# Patient Record
Sex: Female | Born: 1990 | Race: Black or African American | Hispanic: No | Marital: Single | Smoking: Current every day smoker
Health system: Southern US, Community
[De-identification: ages and names within clinical notes are randomized; demographics above are authoritative.]

## PROBLEM LIST (undated history)

## (undated) ENCOUNTER — Inpatient Hospital Stay (HOSPITAL_COMMUNITY): Payer: Self-pay

## (undated) VITALS — BP 102/67 | HR 72 | Temp 98.4°F | Resp 16

## (undated) DIAGNOSIS — IMO0002 Reserved for concepts with insufficient information to code with codable children: Secondary | ICD-10-CM

## (undated) DIAGNOSIS — S01512A Laceration without foreign body of oral cavity, initial encounter: Secondary | ICD-10-CM

## (undated) DIAGNOSIS — F32A Depression, unspecified: Secondary | ICD-10-CM

## (undated) DIAGNOSIS — B009 Herpesviral infection, unspecified: Secondary | ICD-10-CM

## (undated) DIAGNOSIS — F419 Anxiety disorder, unspecified: Secondary | ICD-10-CM

## (undated) DIAGNOSIS — Z789 Other specified health status: Secondary | ICD-10-CM

## (undated) DIAGNOSIS — Z8619 Personal history of other infectious and parasitic diseases: Secondary | ICD-10-CM

## (undated) DIAGNOSIS — F319 Bipolar disorder, unspecified: Secondary | ICD-10-CM

## (undated) DIAGNOSIS — F99 Mental disorder, not otherwise specified: Secondary | ICD-10-CM

## (undated) DIAGNOSIS — F4310 Post-traumatic stress disorder, unspecified: Secondary | ICD-10-CM

## (undated) DIAGNOSIS — D649 Anemia, unspecified: Secondary | ICD-10-CM

## (undated) DIAGNOSIS — S99911A Unspecified injury of right ankle, initial encounter: Secondary | ICD-10-CM

## (undated) DIAGNOSIS — F329 Major depressive disorder, single episode, unspecified: Secondary | ICD-10-CM

## (undated) HISTORY — PX: UTERINE ARTERY EMBOLIZATION: SHX2629

## (undated) HISTORY — DX: Herpesviral infection, unspecified: B00.9

## (undated) HISTORY — DX: Personal history of other infectious and parasitic diseases: Z86.19

## (undated) HISTORY — DX: Post-traumatic stress disorder, unspecified: F43.10

## (undated) HISTORY — DX: Unspecified injury of right ankle, initial encounter: S99.911A

## (undated) HISTORY — DX: Depression, unspecified: F32.A

## (undated) HISTORY — DX: Major depressive disorder, single episode, unspecified: F32.9

## (undated) HISTORY — DX: Bipolar disorder, unspecified: F31.9

## (undated) HISTORY — DX: Reserved for concepts with insufficient information to code with codable children: IMO0002

## (undated) HISTORY — DX: Laceration without foreign body of oral cavity, initial encounter: S01.512A

## (undated) HISTORY — DX: Mental disorder, not otherwise specified: F99

## (undated) HISTORY — DX: Anemia, unspecified: D64.9

## (undated) HISTORY — PX: WISDOM TOOTH EXTRACTION: SHX21

---

## 2009-10-05 ENCOUNTER — Emergency Department (HOSPITAL_COMMUNITY): Admission: EM | Admit: 2009-10-05 | Discharge: 2009-10-05 | Payer: Self-pay | Admitting: Emergency Medicine

## 2010-10-18 NOTE — L&D Delivery Note (Signed)
Delivery Note At 1:52 PM a viable female was delivered via Vaginal, Spontaneous Delivery (Presentation: Left Occiput Anterior).  APGAR: , ; weight .   Placenta status: Intact, Spontaneous.  Cord:  with the following complications: .  Anesthesia: Epidural  Episiotomy: None Lacerations: Vaginal; l and rLabial Suture Repair: 3.0 vicryl Est. Blood Loss (mL): 350  Mom to postpartum.  Baby to nursery-stable.  [name] 08/14/2011, 2:12 PM

## 2010-12-30 ENCOUNTER — Inpatient Hospital Stay (HOSPITAL_COMMUNITY): Payer: Medicaid Other

## 2010-12-30 ENCOUNTER — Inpatient Hospital Stay (HOSPITAL_COMMUNITY)
Admission: AD | Admit: 2010-12-30 | Discharge: 2010-12-30 | Disposition: A | Discharge status: Home / Self Care | Payer: Medicaid Other | Source: Ambulatory Visit | Attending: Family Medicine | Admitting: Family Medicine

## 2010-12-30 DIAGNOSIS — O99891 Other specified diseases and conditions complicating pregnancy: Secondary | ICD-10-CM | POA: Insufficient documentation

## 2010-12-30 DIAGNOSIS — O210 Mild hyperemesis gravidarum: Secondary | ICD-10-CM

## 2010-12-30 DIAGNOSIS — O9989 Other specified diseases and conditions complicating pregnancy, childbirth and the puerperium: Secondary | ICD-10-CM

## 2010-12-30 DIAGNOSIS — R109 Unspecified abdominal pain: Secondary | ICD-10-CM

## 2010-12-30 LAB — URINALYSIS, ROUTINE W REFLEX MICROSCOPIC
Bilirubin Urine: NEGATIVE
Glucose, UA: NEGATIVE mg/dL
Ketones, ur: NEGATIVE mg/dL
pH: 8.5 — ABNORMAL HIGH (ref 5.0–8.0)

## 2010-12-30 LAB — ABO/RH: ABO/RH(D): B POS

## 2010-12-30 LAB — CBC
HCT: 34.7 % — ABNORMAL LOW (ref 36.0–46.0)
Hemoglobin: 11.7 g/dL — ABNORMAL LOW (ref 12.0–15.0)
MCH: 28.7 pg (ref 26.0–34.0)
MCHC: 33.7 g/dL (ref 30.0–36.0)

## 2010-12-30 LAB — HCG, QUANTITATIVE, PREGNANCY

## 2010-12-31 LAB — GC/CHLAMYDIA PROBE AMP, GENITAL

## 2011-01-10 ENCOUNTER — Emergency Department (HOSPITAL_COMMUNITY)
Admission: EM | Admit: 2011-01-10 | Discharge: 2011-01-11 | Disposition: A | Discharge status: Home / Self Care | Payer: Medicaid Other | Attending: Emergency Medicine | Admitting: Emergency Medicine

## 2011-01-10 ENCOUNTER — Emergency Department (HOSPITAL_COMMUNITY): Payer: Medicaid Other

## 2011-01-10 DIAGNOSIS — O210 Mild hyperemesis gravidarum: Secondary | ICD-10-CM | POA: Insufficient documentation

## 2011-01-10 DIAGNOSIS — B3731 Acute candidiasis of vulva and vagina: Secondary | ICD-10-CM | POA: Insufficient documentation

## 2011-01-10 DIAGNOSIS — N949 Unspecified condition associated with female genital organs and menstrual cycle: Secondary | ICD-10-CM | POA: Insufficient documentation

## 2011-01-10 DIAGNOSIS — F341 Dysthymic disorder: Secondary | ICD-10-CM | POA: Insufficient documentation

## 2011-01-10 DIAGNOSIS — O99891 Other specified diseases and conditions complicating pregnancy: Secondary | ICD-10-CM | POA: Insufficient documentation

## 2011-01-10 DIAGNOSIS — F319 Bipolar disorder, unspecified: Secondary | ICD-10-CM | POA: Insufficient documentation

## 2011-01-10 DIAGNOSIS — O2390 Unspecified genitourinary tract infection in pregnancy, unspecified trimester: Secondary | ICD-10-CM | POA: Insufficient documentation

## 2011-01-10 DIAGNOSIS — B373 Candidiasis of vulva and vagina: Secondary | ICD-10-CM | POA: Insufficient documentation

## 2011-01-10 DIAGNOSIS — R45851 Suicidal ideations: Secondary | ICD-10-CM | POA: Insufficient documentation

## 2011-01-10 LAB — COMPREHENSIVE METABOLIC PANEL
ALT: 14 U/L (ref 0–35)
Alkaline Phosphatase: 45 U/L (ref 39–117)
CO2: 27 mEq/L (ref 19–32)
Calcium: 9.6 mg/dL (ref 8.4–10.5)
Chloride: 103 mEq/L (ref 96–112)
GFR calc non Af Amer: >60 mL/min (ref >60)
Glucose, Bld: 71 mg/dL (ref 70–99)
Sodium: 136 mEq/L (ref 135–145)
Total Bilirubin: 0.1 mg/dL — ABNORMAL LOW (ref 0.3–1.2)

## 2011-01-10 LAB — URINE MICROSCOPIC-ADD ON

## 2011-01-10 LAB — CBC
MCH: 29.1 pg (ref 26.0–34.0)
Platelets: 219 10*3/uL (ref 150–400)
RBC: 4.12 MIL/uL (ref 3.87–5.11)
RDW: 12.6 % (ref 11.5–15.5)
WBC: 10.6 10*3/uL — ABNORMAL HIGH (ref 4.0–10.5)

## 2011-01-10 LAB — URINALYSIS, ROUTINE W REFLEX MICROSCOPIC
Glucose, UA: NEGATIVE mg/dL
Hgb urine dipstick: NEGATIVE
Leukocytes, UA: NEGATIVE
Specific Gravity, Urine: 1.025 (ref 1.005–1.030)
pH: 6 (ref 5.0–8.0)

## 2011-01-10 LAB — DIFFERENTIAL
Basophils Relative: 0 % (ref 0–1)
Eosinophils Absolute: 0.1 10*3/uL (ref 0.0–0.7)
Monocytes Relative: 9 % (ref 3–12)
Neutrophils Relative %: 70 % (ref 43–77)

## 2011-01-10 LAB — POCT PREGNANCY, URINE

## 2011-01-10 LAB — RAPID URINE DRUG SCREEN, HOSP PERFORMED: Benzodiazepines: NOT DETECTED

## 2011-01-10 LAB — WET PREP, GENITAL: Trich, Wet Prep: NONE SEEN

## 2011-01-13 ENCOUNTER — Other Ambulatory Visit: Payer: Self-pay | Admitting: Obstetrics and Gynecology

## 2011-01-13 ENCOUNTER — Encounter: Payer: Self-pay | Admitting: Obstetrics and Gynecology

## 2011-01-13 DIAGNOSIS — O358XX0 Maternal care for other (suspected) fetal abnormality and damage, not applicable or unspecified: Secondary | ICD-10-CM

## 2011-01-13 DIAGNOSIS — Z331 Pregnant state, incidental: Secondary | ICD-10-CM

## 2011-01-13 DIAGNOSIS — O99340 Other mental disorders complicating pregnancy, unspecified trimester: Secondary | ICD-10-CM

## 2011-01-13 LAB — CONVERTED CEMR LAB
Basophils Absolute: 0 10*3/uL (ref 0.0–0.1)
HCT: 35.1 % — ABNORMAL LOW (ref 36.0–46.0)
HIV: NONREACTIVE
Hemoglobin: 12.1 g/dL (ref 12.0–15.0)
Hepatitis B Surface Ag: NEGATIVE
Lymphocytes Relative: 22 % (ref 12–46)
Lymphs Abs: 1.6 10*3/uL (ref 0.7–4.0)
Monocytes Absolute: 0.7 10*3/uL (ref 0.1–1.0)
Neutro Abs: 4.8 10*3/uL (ref 1.7–7.7)
RDW: 13.1 % (ref 11.5–15.5)
Rh Type: POSITIVE
Rubella: 49.3 intl units/mL — ABNORMAL HIGH
WBC: 7.2 10*3/uL (ref 4.0–10.5)

## 2011-01-13 LAB — POCT URINALYSIS DIP (DEVICE)
Hgb urine dipstick: NEGATIVE
Nitrite: NEGATIVE
Specific Gravity, Urine: 1.02 (ref 1.005–1.030)
Urobilinogen, UA: 0.2 mg/dL (ref 0.0–1.0)
pH: 8 (ref 5.0–8.0)

## 2011-01-18 LAB — POCT I-STAT, CHEM 8
BUN: 10 mg/dL (ref 6–23)
Calcium, Ion: 1.13 mmol/L (ref 1.12–1.32)
Chloride: 102 mEq/L (ref 96–112)
HCT: 42 % (ref 36.0–46.0)
Potassium: 3.8 mEq/L (ref 3.5–5.1)
Sodium: 136 mEq/L (ref 135–145)

## 2011-01-18 LAB — URINALYSIS, ROUTINE W REFLEX MICROSCOPIC
Bilirubin Urine: NEGATIVE
Glucose, UA: NEGATIVE mg/dL
Hgb urine dipstick: NEGATIVE
Protein, ur: NEGATIVE mg/dL
Specific Gravity, Urine: 1.026 (ref 1.005–1.030)
Urobilinogen, UA: 1 mg/dL (ref 0.0–1.0)

## 2011-01-18 LAB — RAPID STREP SCREEN (MED CTR MEBANE ONLY): Streptococcus, Group A Screen (Direct): NEGATIVE

## 2011-01-18 LAB — DIFFERENTIAL
Basophils Absolute: 0 10*3/uL (ref 0.0–0.1)
Eosinophils Relative: 0 % (ref 0–5)
Lymphocytes Relative: 7 % — ABNORMAL LOW (ref 12–46)
Lymphs Abs: 0.8 10*3/uL (ref 0.7–4.0)
Neutro Abs: 9.6 10*3/uL — ABNORMAL HIGH (ref 1.7–7.7)
Neutrophils Relative %: 86 % — ABNORMAL HIGH (ref 43–77)

## 2011-01-18 LAB — URINE MICROSCOPIC-ADD ON

## 2011-01-18 LAB — CBC
HCT: 39.3 % (ref 36.0–46.0)
Platelets: 193 10*3/uL (ref 150–400)
RDW: 13.1 % (ref 11.5–15.5)
WBC: 11.2 10*3/uL — ABNORMAL HIGH (ref 4.0–10.5)

## 2011-01-18 LAB — PREGNANCY, URINE: Preg Test, Ur: NEGATIVE

## 2011-02-17 ENCOUNTER — Other Ambulatory Visit: Payer: Self-pay | Admitting: Obstetrics and Gynecology

## 2011-02-17 DIAGNOSIS — Z331 Pregnant state, incidental: Secondary | ICD-10-CM

## 2011-02-17 DIAGNOSIS — O99340 Other mental disorders complicating pregnancy, unspecified trimester: Secondary | ICD-10-CM

## 2011-02-17 DIAGNOSIS — O358XX0 Maternal care for other (suspected) fetal abnormality and damage, not applicable or unspecified: Secondary | ICD-10-CM

## 2011-02-17 LAB — POCT URINALYSIS DIP (DEVICE)
Hgb urine dipstick: NEGATIVE
Nitrite: NEGATIVE
Protein, ur: NEGATIVE mg/dL
Specific Gravity, Urine: 1.025 (ref 1.005–1.030)
Urobilinogen, UA: 0.2 mg/dL (ref 0.0–1.0)

## 2011-02-21 ENCOUNTER — Inpatient Hospital Stay (HOSPITAL_COMMUNITY)
Admission: AD | Admit: 2011-02-21 | Discharge: 2011-02-21 | Disposition: A | Discharge status: Home / Self Care | Payer: Medicaid Other | Source: Ambulatory Visit | Attending: Obstetrics & Gynecology | Admitting: Obstetrics & Gynecology

## 2011-02-21 DIAGNOSIS — R109 Unspecified abdominal pain: Secondary | ICD-10-CM

## 2011-02-21 DIAGNOSIS — K5900 Constipation, unspecified: Secondary | ICD-10-CM | POA: Insufficient documentation

## 2011-02-21 DIAGNOSIS — O9989 Other specified diseases and conditions complicating pregnancy, childbirth and the puerperium: Secondary | ICD-10-CM

## 2011-02-21 DIAGNOSIS — O99891 Other specified diseases and conditions complicating pregnancy: Secondary | ICD-10-CM | POA: Insufficient documentation

## 2011-02-21 LAB — URINALYSIS, ROUTINE W REFLEX MICROSCOPIC
Bilirubin Urine: NEGATIVE
Glucose, UA: NEGATIVE mg/dL
Hgb urine dipstick: NEGATIVE
Ketones, ur: NEGATIVE mg/dL
Protein, ur: NEGATIVE mg/dL

## 2011-03-03 ENCOUNTER — Inpatient Hospital Stay (INDEPENDENT_AMBULATORY_CARE_PROVIDER_SITE_OTHER)
Admission: RE | Admit: 2011-03-03 | Discharge: 2011-03-03 | Disposition: A | Discharge status: Home / Self Care | Payer: Medicaid Other | Source: Ambulatory Visit | Attending: Family Medicine | Admitting: Family Medicine

## 2011-03-03 DIAGNOSIS — J069 Acute upper respiratory infection, unspecified: Secondary | ICD-10-CM

## 2011-03-03 LAB — POCT URINALYSIS DIP (DEVICE)
Hgb urine dipstick: NEGATIVE
Protein, ur: NEGATIVE mg/dL
Specific Gravity, Urine: 1.02 (ref 1.005–1.030)
Urobilinogen, UA: 0.2 mg/dL (ref 0.0–1.0)
pH: 7.5 (ref 5.0–8.0)

## 2011-03-04 ENCOUNTER — Other Ambulatory Visit: Payer: Self-pay | Admitting: Family Medicine

## 2011-03-04 ENCOUNTER — Ambulatory Visit (INDEPENDENT_AMBULATORY_CARE_PROVIDER_SITE_OTHER): Payer: Medicaid Other | Admitting: Family Medicine

## 2011-03-04 DIAGNOSIS — Z331 Pregnant state, incidental: Secondary | ICD-10-CM

## 2011-03-05 ENCOUNTER — Encounter: Payer: Self-pay | Admitting: Family Medicine

## 2011-03-05 NOTE — Progress Notes (Signed)
Here for Group Prenatal Visit: S: Feels well. Is very excited about the group visits. Had some initial prenatal care at [facility] thus has not yet had an initial prenatal visit here. No issues today. Notes that she is still smoking a few cigarettes a day.  Also notes that she has bipolar disorder. Is getting intense group therapy but is not taking any medications at this time. Also notes that she recently quit  Heavy MJ use.  O: Vs noted. Well appearing. See flowsheet.  A/P: 20 yo at 61 w 5 days. 1) Pregnancy: Will need anatomy US scheduled in 2 weeks or so. Will also need to review labs and hollister from [facility]. If info is incomplete will ask to schedule for a 1 on 1 initial prenatal visit. Will follow. 2) MJ use: Currently stopped. Good for her. Will follow and encourage continued use of group therapy. Will check UDS upon admit to the hospital for labor. 3) Bipolar Disorder: Seems under good control while in the group. Is getting very good therapy. However not on any medications as they are potentially teratogenic. Will follow. If getting manic will discuss with psych.  4) Tobacco: Uses a few ciggs a day. Group encouraged Tiffany Wong to quit and gave quite a few suggestions on how to do this. Will follow.  5) Group: Worked well. Will follow.

## 2011-03-05 NOTE — Progress Notes (Signed)
I saw [name] with Dr. Denyse Amass and reviewed and agree with his note.  [name] is a 20 yo who has joined our prenatal care group.  ROI was sent today.  We will review her records for Family and past medical hx, as well as prior OB hx.  Most labs available, but I do not yet see the sickle cell screen and we will need to determine if genetic testing was offered and desired.  [name] has several issues including BPAD, smoking and MJ use that could complicate the pregnancy.  Will review records.  Plan to continue care in the prenatal group program.  Schedule ultrasound for anatomy scan.

## 2011-03-07 ENCOUNTER — Inpatient Hospital Stay (HOSPITAL_COMMUNITY)
Admission: AD | Admit: 2011-03-07 | Discharge: 2011-03-07 | Disposition: A | Discharge status: Home / Self Care | Payer: Medicaid Other | Source: Ambulatory Visit | Attending: Obstetrics & Gynecology | Admitting: Obstetrics & Gynecology

## 2011-03-07 DIAGNOSIS — J069 Acute upper respiratory infection, unspecified: Secondary | ICD-10-CM | POA: Insufficient documentation

## 2011-03-07 LAB — URINALYSIS, ROUTINE W REFLEX MICROSCOPIC
Bilirubin Urine: NEGATIVE
Glucose, UA: 100 mg/dL — AB
Hgb urine dipstick: NEGATIVE
Ketones, ur: 15 mg/dL — AB
Protein, ur: NEGATIVE mg/dL

## 2011-03-17 ENCOUNTER — Other Ambulatory Visit: Payer: Self-pay | Admitting: Physician Assistant

## 2011-03-17 DIAGNOSIS — Z3689 Encounter for other specified antenatal screening: Secondary | ICD-10-CM

## 2011-03-17 DIAGNOSIS — O99340 Other mental disorders complicating pregnancy, unspecified trimester: Secondary | ICD-10-CM

## 2011-03-17 DIAGNOSIS — O358XX0 Maternal care for other (suspected) fetal abnormality and damage, not applicable or unspecified: Secondary | ICD-10-CM

## 2011-03-17 DIAGNOSIS — Z331 Pregnant state, incidental: Secondary | ICD-10-CM

## 2011-03-17 LAB — POCT URINALYSIS DIP (DEVICE)
Glucose, UA: NEGATIVE mg/dL
Hgb urine dipstick: NEGATIVE
Specific Gravity, Urine: 1.015 (ref 1.005–1.030)
Urobilinogen, UA: 0.2 mg/dL (ref 0.0–1.0)
pH: 8.5 — ABNORMAL HIGH (ref 5.0–8.0)

## 2011-03-23 ENCOUNTER — Ambulatory Visit (HOSPITAL_COMMUNITY)
Admission: RE | Admit: 2011-03-23 | Discharge: 2011-03-23 | Disposition: A | Discharge status: Home / Self Care | Payer: Medicaid Other | Source: Ambulatory Visit | Attending: Physician Assistant | Admitting: Physician Assistant

## 2011-03-23 DIAGNOSIS — Z363 Encounter for antenatal screening for malformations: Secondary | ICD-10-CM | POA: Insufficient documentation

## 2011-03-23 DIAGNOSIS — Z3689 Encounter for other specified antenatal screening: Secondary | ICD-10-CM

## 2011-03-23 DIAGNOSIS — O358XX0 Maternal care for other (suspected) fetal abnormality and damage, not applicable or unspecified: Secondary | ICD-10-CM | POA: Insufficient documentation

## 2011-03-23 DIAGNOSIS — Z1389 Encounter for screening for other disorder: Secondary | ICD-10-CM | POA: Insufficient documentation

## 2011-04-01 ENCOUNTER — Ambulatory Visit (INDEPENDENT_AMBULATORY_CARE_PROVIDER_SITE_OTHER): Payer: Medicaid Other | Admitting: Family Medicine

## 2011-04-01 ENCOUNTER — Encounter: Payer: Self-pay | Admitting: Family Medicine

## 2011-04-01 VITALS — BP 100/59 | Wt 118.0 lb

## 2011-04-01 DIAGNOSIS — Z3490 Encounter for supervision of normal pregnancy, unspecified, unspecified trimester: Secondary | ICD-10-CM

## 2011-04-01 DIAGNOSIS — Z3480 Encounter for supervision of other normal pregnancy, unspecified trimester: Secondary | ICD-10-CM

## 2011-04-01 NOTE — Progress Notes (Signed)
Addendum to A/P 1. Repeat Urine Culture obtained to confirm GBS negative status and absence of asymptomatic bacteruria.

## 2011-04-01 NOTE — Progress Notes (Signed)
Here for group prenatal visit.  S: She is feeling well. She is taking her PNV. She complains of round ligament pain and consitpation. She is smoking 6 cigs/day. She is interested in meeting with Dr. Raymondo Band for smoking cessation counseling. She reports thin, white vaginal D/C. She denies vaginal itching or irritation.  O: VS, reviewed. BP low normal.  GEN: well appearing. Well hydrated. NAD.  ABD: Gravid. FH 21 cm.   EXT: No edema   A/P: 20 yo primigravida at [redacted]w[redacted]d.   1. Pregnancy- progressing well. We discussed physical activity in pregnancy and an introduction to preterm labor today. Exercise and adequate water intake to prevent and treat constipation. The container today addressed abdominal and hand pain, breast pain and headache.  P: Continue GIFT visits. 1 hr GTT at 26-28 weeks.  2. Smoking- continuing to work on cessation. Referral to Dr. Raymondo Band for smoking cessation. Will follow.  3. Bipolar- Mood stable. Focused and interactive during group. Continue current management.  4. F/u- next group meeting in 4 weeks.

## 2011-04-03 LAB — CULTURE, OB URINE: Organism ID, Bacteria: NO GROWTH

## 2011-05-04 ENCOUNTER — Telehealth: Payer: Self-pay | Admitting: Family Medicine

## 2011-05-04 NOTE — Telephone Encounter (Signed)
Spoke with pt.  She is having some trouble sleeping, but otherwise doing ok.  She plans to attend the group visit on 7/26 at [facility].  She also has another appt scheduled for eval of a possible yeast infection.

## 2011-05-13 ENCOUNTER — Ambulatory Visit (INDEPENDENT_AMBULATORY_CARE_PROVIDER_SITE_OTHER): Payer: Medicaid Other | Admitting: Family Medicine

## 2011-05-13 VITALS — BP 97/55 | Wt 120.0 lb

## 2011-05-13 DIAGNOSIS — F1911 Other psychoactive substance abuse, in remission: Secondary | ICD-10-CM

## 2011-05-13 DIAGNOSIS — IMO0002 Reserved for concepts with insufficient information to code with codable children: Secondary | ICD-10-CM | POA: Insufficient documentation

## 2011-05-13 DIAGNOSIS — Z3400 Encounter for supervision of normal first pregnancy, unspecified trimester: Secondary | ICD-10-CM

## 2011-05-13 DIAGNOSIS — F319 Bipolar disorder, unspecified: Secondary | ICD-10-CM

## 2011-05-13 LAB — GLUCOSE, CAPILLARY: Glucose-Capillary: 115 mg/dL — ABNORMAL HIGH (ref 70–99)

## 2011-05-13 LAB — CBC
MCV: 88.1 fL (ref 78.0–100.0)
Platelets: 181 10*3/uL (ref 150–400)
RBC: 3.7 MIL/uL — ABNORMAL LOW (ref 3.87–5.11)
WBC: 9.9 10*3/uL (ref 4.0–10.5)

## 2011-05-13 MED ORDER — NATALCARE PIC 60-1 MG PO TABS: 1.0000 | ORAL_TABLET | Freq: Every day | ORAL | Status: DC

## 2011-05-13 NOTE — Patient Instructions (Addendum)
Our next group will meet as scheduled on Thursday, August 9th at 2:30. We will meet in classroom 4 at [facility].  This is by the clinics and the education entrance.    If you have bleeding, if your water breaks, if you have regular contractions that last for 2 hours, or if the baby is not moving well, please go to [facility] to be evaluated.  You can buy over the counter miconazole or clotrimazole (we will give you a prescription for the miconazole in case it is cheaper with a prescription).  If that does not clear up the infection, you can try fluconazole 150 mg one pill one time.  We will also give you a prescription for that.

## 2011-05-13 NOTE — Progress Notes (Signed)
S: Doing well, having some vaginal discharge. Pt is concerned about yeast infection.  Biggest concerns are constipation and hyperpigmentation on her abdomen.  Continues trying to cut down on tobacco use.  O: See flow sheet  A/P: G3P0020 @ 27.4wks 1) Pregnancy: Overall doing well; 28 wk labs drawn today 2) Vaginal d/c: Will encourage use of OTC products for presumed yeast infection; if not improved with these, will do wet prep and treat with diflucan if indicated 3) 1hr GTT: normal today at 115, pt very excited 4) F/u in 2 weeks at [facility] for next group visit; next labs needed will be cultures at 36-38 wks

## 2011-05-14 ENCOUNTER — Ambulatory Visit (INDEPENDENT_AMBULATORY_CARE_PROVIDER_SITE_OTHER): Payer: Medicaid Other | Admitting: Family Medicine

## 2011-05-14 VITALS — BP 115/73 | Wt 118.0 lb

## 2011-05-14 DIAGNOSIS — N760 Acute vaginitis: Secondary | ICD-10-CM

## 2011-05-14 DIAGNOSIS — Z3400 Encounter for supervision of normal first pregnancy, unspecified trimester: Secondary | ICD-10-CM

## 2011-05-14 DIAGNOSIS — IMO0002 Reserved for concepts with insufficient information to code with codable children: Secondary | ICD-10-CM

## 2011-05-14 DIAGNOSIS — A6000 Herpesviral infection of urogenital system, unspecified: Secondary | ICD-10-CM

## 2011-05-14 DIAGNOSIS — A6009 Herpesviral infection of other urogenital tract: Secondary | ICD-10-CM | POA: Insufficient documentation

## 2011-05-14 LAB — POCT WET PREP (WET MOUNT): Trichomonas Wet Prep HPF POC: NEGATIVE

## 2011-05-14 MED ORDER — VALACYCLOVIR HCL 500 MG PO TABS: ORAL_TABLET | ORAL | Status: DC

## 2011-05-14 NOTE — Patient Instructions (Signed)
Welma and Quinton,  Thank you for coming in today. Congrats on the baby!  Here is some info/precautions that I always like to review in the third trimester:  Please watch out for signs of preterm labor: vaginal bleeding, leakage or gush of fluids, 4 or more contractions for 2 or more hours. Please call the clinic if any of these occur and go to [facility].  Please watch out for decreased fetal movement: If you feel that the baby is not moving as well as usual. You should count kicks. Go to a quite room and count each kick. Anything less than 10 kicks an hour for 2 or more hours is decreased/low. If this happens try drinking cool water. If that does not help. [facility] the [facility]   See you at the next GIFT.  -Dr. Armen Pickup

## 2011-05-14 NOTE — Progress Notes (Signed)
S: 20 yo primigravida here to f/u ? Yeast infection and ? Herpes genitalis outbreak. 1. ? Yeast: thick white vaginal discharge x 2 weeks. No vaginal bleeding or LOF. Vaginal itching and burning. Has not yet filled script for Diflucan.  2. ? Herpes outbreak: hx of herpes x 2 years. Last outbreak more than one year ago. Felt she had an outbreak associated with yeast infection. Describes itching and burning. No noticeable lesions. Sexually active with FOB. Has not been sexually active since sensation of outbreak. FOB is not aware of her history of genital herpes. She states that she is afraid to tell him and afraid of his response. He is here today. She does not want to tell him now, but is willing to bring him in to tell him in the near future.   ROS: See OB flowsheet. PE: GEN: well developed, NAD, well hydrated.  Abd: Gravid, NT, FH 28 cm, FHR 151 Genital: no vesicles or ulcers on perineum. ? Swelling. Thick white discharge in vagina. No bleeding or fluid from cervical os. Cervix long/closed/high. No cervical motion tenderness.  EXT: no edema  A/P: 20 yo primigravida at 27w5 day. Precepted with Dr. Mauricio Po.  1. Vaginitis: 2/2 to yeast. Treat with Difucan. 2. ? Herpes genitalis outbreak: no active lesions. Will treat with valacyclovir. Start prophylactic valacyclovir at 35 weeks. Instructed pt to avoid sexual intercourse until completion of treatment. Advised pt to tell partner as soon as possible.  3. reviewed prenatal labs. 1 hr glucola screen passed.  4. F/u: 2 weeks at next GIFT.

## 2011-05-16 NOTE — Assessment & Plan Note (Signed)
See note

## 2011-05-17 DIAGNOSIS — F319 Bipolar disorder, unspecified: Secondary | ICD-10-CM | POA: Insufficient documentation

## 2011-05-17 DIAGNOSIS — F1911 Other psychoactive substance abuse, in remission: Secondary | ICD-10-CM | POA: Insufficient documentation

## 2011-05-17 NOTE — Progress Notes (Addendum)
Chart reviewed.  Pt seen on 7/26 by me as well as Dr. Fara Boros in GIFT prenatal group visit.  Doing well except for back pain and vaginal itching as discusses below.  Reviewed baby care, preterm labor and kick counts.

## 2011-05-26 ENCOUNTER — Telehealth: Payer: Self-pay | Admitting: Family Medicine

## 2011-05-26 NOTE — Telephone Encounter (Signed)
Left VM for pt that we will meet at [facility] in Classroom 4 at 2:30.

## 2011-06-10 ENCOUNTER — Ambulatory Visit (INDEPENDENT_AMBULATORY_CARE_PROVIDER_SITE_OTHER): Payer: Medicaid Other | Admitting: Family Medicine

## 2011-06-10 DIAGNOSIS — Z3400 Encounter for supervision of normal first pregnancy, unspecified trimester: Secondary | ICD-10-CM

## 2011-06-10 DIAGNOSIS — IMO0002 Reserved for concepts with insufficient information to code with codable children: Secondary | ICD-10-CM

## 2011-06-10 NOTE — Patient Instructions (Addendum)
Please make an appointment with Dr. Raymondo Band to discuss other options for helping you quit smoking! You've already done a great job with cutting down, so we would love to help you quit all together!  It was great to see you! I hope that the discussion on breast feeding was helpful! I am giving you some information on kick counts. You can go to [email or web address] to look up prenatal yoga classes to help with your back pain. We will see you in 2 weeks for the next session!   Kick Count Fetal Movement Counts Kick counts is highly recommended in high risk pregnancies, but it is a good idea for every pregnant woman to do. Start counting fetal movements at 28 weeks of the pregnancy. Fetal movements increase after eating a full meal or eating or drinking something sweet (the blood sugar is higher). It is also important to drink plenty of fluids (well hydrated) before doing the count. Lie on your left side because it helps with the circulation or you can sit in a comfortable chair with your arms over your belly (abdomen) with no distractions around you. DOING THE COUNT:  Try to do the count the same time of day each time you do it.   Mark the day and time, then see how long it takes for you to feel 10 movements (kicks, flutters, swishes, rolls). You should have at least 10 movements within 2 hours. You will most likely feel 10 movements in much less than 2 hours. If you do not, wait an hour and count again. After a couple of days you will see a pattern.   What you are looking for is a change in the pattern or not enough counts in 2 hours. Is it taking longer in time to reach 10 movements?  SEEK MEDICAL CARE IF:  You feel less than 10 counts in 2 hours. Tried twice.   No movement in one hour.   The pattern is changing or taking longer each day to reach 10 counts in 2 hours.   You feel the baby is not moving as it usually does.

## 2011-06-10 NOTE — Progress Notes (Signed)
S: Doing well, having some vaginal discharge that has thickened but is not bothersome.  Continues trying to cut down on tobacco use. Baby moved all of the time, sometimes painful/uncomfortbale.  C/o some mild congestion.  O: See flow sheet  A/P: G1P0 @ 31.4  1) Pregnancy: Doing well, 28 wk labs WNL, encouraged continuing PNV  2) Smoking: encouraged pt to make appt with Dr. Raymondo Band to discuss smoking cessation; pt also given 1-800 quit line information 3) F/u 2 weeks for next group visit. Next labs will be GC/Chlam and GBS at 36-38 weeks.  4) Discussed kick counts.  5) Lactation guest speaker today who answered questions about breast feeding.

## 2011-06-14 NOTE — Progress Notes (Addendum)
I saw Tiffany Wong with Dr. Fara Wong and agree with her plan and note.  Tiffany Wong also needs HSV prophylaxis at 36 weeks.

## 2011-06-23 ENCOUNTER — Telehealth: Payer: Self-pay | Admitting: Family Medicine

## 2011-06-23 NOTE — Telephone Encounter (Signed)
Left msg to remind pt of appt tomorrow. 

## 2011-06-24 ENCOUNTER — Ambulatory Visit (INDEPENDENT_AMBULATORY_CARE_PROVIDER_SITE_OTHER): Payer: Medicaid Other | Admitting: Family Medicine

## 2011-06-24 DIAGNOSIS — Z3400 Encounter for supervision of normal first pregnancy, unspecified trimester: Secondary | ICD-10-CM

## 2011-06-24 NOTE — Progress Notes (Signed)
Pt presents for routine care.  She reports she is no longer taking [place] - only med now is prenatal vitamin.  + support from FOB and family.  Still smoking 4-5 cigs/day.  + B-H ctx.  Denies vaginal d/c or LOF. Constipation improved.  Discussed fetal growth and safe sleeping in group today.  Vertex confirmed by ultrasound. A/P: 1) pregnancy - doing well.  PTL precautions and kick counts reviewed.  F/u 2 weeks for group visit 2) HSV- Denies current lesions.  Has valcylovir at home - would like to start at 35 weeks, not 36.  This is fine with me. 3) BPAD- Well controlled.  Pt followed by psych - currently off meds.  Lots of support from boyfriend and family. 4) Smoking - pt trying to quit.  Interested in meeting with Dr. Raymondo Band, but we did not make appt last visit.

## 2011-06-24 NOTE — Patient Instructions (Signed)
Everything look great today. We will see you in 2 weeks at 2:30 again. If you have any bleeding, if your baby is not moving well, if you break your water, or if you have regular contractions, [facility].  Call us with any concerns.  

## 2011-07-07 ENCOUNTER — Telehealth: Payer: Self-pay | Admitting: Family Medicine

## 2011-07-07 NOTE — Telephone Encounter (Signed)
Called at home/cell. Left voicemail reminding patient of GIFT meeting and topics (breastfeeding and baby checklist).

## 2011-07-08 ENCOUNTER — Ambulatory Visit (INDEPENDENT_AMBULATORY_CARE_PROVIDER_SITE_OTHER): Payer: Medicaid Other | Admitting: Family Medicine

## 2011-07-08 VITALS — BP 114/77 | Temp 98.6°F | Wt 129.0 lb

## 2011-07-08 DIAGNOSIS — Z23 Encounter for immunization: Secondary | ICD-10-CM

## 2011-07-08 DIAGNOSIS — Z331 Pregnant state, incidental: Secondary | ICD-10-CM

## 2011-07-09 ENCOUNTER — Ambulatory Visit (INDEPENDENT_AMBULATORY_CARE_PROVIDER_SITE_OTHER): Payer: Medicaid Other | Admitting: Pharmacist

## 2011-07-09 ENCOUNTER — Encounter: Payer: Self-pay | Admitting: Pharmacist

## 2011-07-09 VITALS — BP 95/67 | HR 112 | Wt 127.0 lb

## 2011-07-09 DIAGNOSIS — IMO0002 Reserved for concepts with insufficient information to code with codable children: Secondary | ICD-10-CM

## 2011-07-09 NOTE — Assessment & Plan Note (Signed)
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  She desires to bring up her child in a smoke free environment.  The husband is also a smoker and he is contemplative about quitting at this time. .    Patient willing to continue work on tapering plan at this time. She is committed to smoking < 4 cigs per day by two weeks.  She will need to set a final quit date in the month of October.  Written information provided. Provided information on 1 800-QUIT NOW support program.   F/U Rx contact will be part of her GIFT meeting on October 4th.  Total time with patient in face-to-face counseling 35 minutes.

## 2011-07-09 NOTE — Progress Notes (Signed)
  Subjective:  Age when started using tobacco on a daily basis 13 yoa. Number of Cigarettes per day 6-8 (most per day 2 ppd). Brand smoked [place] Menthol. Estimated Nicotine Content per Cigarette (mg) 1.2.  Estimated Nicotine intake per day 8-10mg .   Smokes first cigarette 10 minutes after waking. Denies waking to smoke - smokes only at night with nerves and frustration.    Estimated Fagerstrom Score 6/10.  Most recent quit attempt never. Longest time ever been tobacco free none.. What Medications (NRT, bupropion, varenicline) used in past includes patch once.  Rates IMPORTANCE of quitting tobacco on 1-10 scale of 10. Rates READINESS of quitting tobacco on 1-10 scale of 10. Rates CONFIDENCE of quitting tobacco on 1-10 scale of 8-9. Triggers to use tobacco include; first in AM while still in BED, After meals, Boredom, TV  Patient ID: Tiffany Wong, female    DOB: 04/09/1991, 20 y.o.   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  She desires to bring up her child in a smoke free environment.  The husband is also a smoker and he is contemplative about quitting at this time. .    Patient willing to continue work on tapering plan at this time. She is committed to smoking < 4 cigs per day by two weeks.  She will need to set a final quit date in the month of October.  Written information provided. Provided information on 1 800-QUIT NOW support program.   F/U Rx contact will be part of her GIFT meeting on October 4th.  Total time with patient in face-to-face counseling 35 minutes.

## 2011-07-09 NOTE — Patient Instructions (Addendum)
Decrease smoking over the next two weeks ( By October 5th) to 3 cigarettes or less per day.  Smoking times include after breakfast, after the afternoon nap and at bedtime.   Next visit is Group Class.

## 2011-07-09 NOTE — Progress Notes (Signed)
S: Pt presents for f/u GIFT. She is accompanied by boyfriend and FOB Fritzi Mandes) and her duola .  S + support from FOB and family.  Still smoking 5-6 cigs/day.  She reports pain and contractions at night. Denies vaginal d/c or LOF.Vertex confirmed by ultrasound. O: see flow sheet  A/P: 1) pregnancy - doing well.  Breastfeeding reviewed. Collected GC/Chlam and GBS today. Administered flu and tdap vaccine.    2) HSV- Denies current lesions.  Taking prophylactic valacyclovir.  3) BPAD- Well controlled.  Pt followed by psych - currently off meds.  Lots of support from boyfriend and family. 4) Smoking - pt trying to quit.  Smoking 5-6 cigs per day. Interested in meeting with Dr. Raymondo Band. 5) Circ: desired. Gave pt phone number to [facility] to arrange outpt circ. 6) Feeding- breast 7) Contraception- patch/ring 8)  F/u- 2 weeks with GIFT, then q weekly.

## 2011-07-12 NOTE — Progress Notes (Signed)
  Subjective:    Patient ID: Tiffany Wong, female    DOB: 1991-06-26, 20 y.o.   MRN: [id number]  HPI Reviewed and agree with Dr. Macky Lower management.    Review of Systems     Objective:   Physical Exam        Assessment & Plan:

## 2011-07-16 ENCOUNTER — Ambulatory Visit (INDEPENDENT_AMBULATORY_CARE_PROVIDER_SITE_OTHER): Payer: Medicaid Other | Admitting: Family Medicine

## 2011-07-16 ENCOUNTER — Encounter: Payer: Self-pay | Admitting: Family Medicine

## 2011-07-16 VITALS — BP 93/66 | HR 99 | Wt 128.0 lb

## 2011-07-16 DIAGNOSIS — N898 Other specified noninflammatory disorders of vagina: Secondary | ICD-10-CM

## 2011-07-16 DIAGNOSIS — B373 Candidiasis of vulva and vagina: Secondary | ICD-10-CM

## 2011-07-16 LAB — POCT WET PREP (WET MOUNT)
Clue Cells Wet Prep HPF POC: NEGATIVE
WBC, Wet Prep HPF POC: >20

## 2011-07-16 MED ORDER — FLUCONAZOLE 150 MG PO TABS: 150.0000 mg | ORAL_TABLET | Freq: Once | ORAL | Status: AC

## 2011-07-19 DIAGNOSIS — B3731 Acute candidiasis of vulva and vagina: Secondary | ICD-10-CM | POA: Insufficient documentation

## 2011-07-19 DIAGNOSIS — B373 Candidiasis of vulva and vagina: Secondary | ICD-10-CM | POA: Insufficient documentation

## 2011-07-19 NOTE — Progress Notes (Addendum)
  Subjective:    Patient ID: Tiffany Wong, female    DOB: Oct 15, 1991, 20 y.o.   MRN: [id number]  HPI Vaginal discharge: Patient reports positive itching, and burning/ raw sensation in the introitus of vagina. Has been present x1-2 weeks. Tried over-the-counter yeast infection cream. This did not seem to help. Would like to be tested and given something stronger. Patient denies abdominal pain. No fever. No recent herpes outbreak. Baby is moving well. No vaginal discharge. No vaginal bleeding. Negative GC chlamydia test on 9/20.  Patient denies any sexual exposure since this negative test.  Review of Systems As per above    Objective:   Physical Exam  Constitutional: She appears well-developed and well-nourished.  Cardiovascular: Normal rate.   Pulmonary/Chest: Effort normal. No respiratory distress.  Abdominal: There is no tenderness.       Gravid  Genitourinary: Vagina normal. There is no rash, tenderness or lesion on the right labia. There is no rash, tenderness or lesion on the left labia. Cervix exhibits discharge (Moderate amount of white discharge, positive stuck on appearance to vaginal wall, consistent with yeast). Cervix exhibits no motion tenderness and no friability.          Assessment & Plan:

## 2011-07-22 ENCOUNTER — Ambulatory Visit (INDEPENDENT_AMBULATORY_CARE_PROVIDER_SITE_OTHER): Payer: Medicaid Other | Admitting: Family Medicine

## 2011-07-22 DIAGNOSIS — Z331 Pregnant state, incidental: Secondary | ICD-10-CM

## 2011-07-22 NOTE — Patient Instructions (Signed)
Thank you for coming in today. Please make an appointment with Dr. Armen Pickup, [place], or [place] in 1 week.  Normal Labor and Delivery (First Baby) Your caregiver must first be sure you are in labor. Signs of labor include:  You may pass what is called "the mucus plug" before labor begins. This is a small amount of blood stained mucus.   Regular uterine contractions.   The time between contractions get closer together.   The discomfort and pain gradually gets more intense.   Pains are mostly located in the back.   Pains get worse when walking.   The cervix (the opening of the uterus becomes thinner (begins to efface) and opens up (dilates).  Once you are in labor and admitted into the hospital or care center, your caregiver will do the following:  A complete physical examination.   Check your vital signs (blood pressure, pulse, temperature and the fetal heart rate).   Do a vaginal examination (using a sterile glove and lubricant) to determine:   The position (presentation) of the baby (head [vertex] or buttock first).   The level (station) of the baby's head in the birth canal.   The effacement and dilatation of the cervix.   You may have your pubic hair shaved and be given an enema depending on your caregiver and the circumstance.   An electronic monitor is usually placed on your abdomen. The monitor follows the length and intensity of the contractions, as well as the baby's heart rate.   Usually, your caregiver will insert an IV in your arm with a bottle of sugar water. This is done as a precaution so that medications can be given to you quickly during labor or delivery.  NORMAL LABOR AND DELIVERY IS DIVIDED UP INTO THREE STAGES: First Stage This is when regular contractions begin and the cervix begins to efface and dilate. This stage can last from 3 to 15 hours. The end of the first stage is when the cervix is 100% effaced and 10 centimeters dilated. Pain medications may be  given by    Injection (morphine, demerol, etc.)   Regional anesthesia (spinal, caudal or epidural, anesthetics given in different locations of the spine). Paracervical pain medication may be given, which is an injection of and anesthetic (novocaine or xylocaine) on each side of the cervix.  A pregnant woman may request to have "Natural Childbirth" which is not to have any medications or anesthesia during her labor and delivery. Second Stage This is when the baby comes down through the birth canal (vagina) and is born. This can take 1 to 4 hours. As the baby's head comes down through the birth canal, you may feel like you are going to have a bowel movement. You will get the urge to bear down and push until the baby is delivered. As the baby's head is being delivered, the caregiver will decide if an episiotomy (a cut in the perineum and vagina area) is needed to prevent tearing of the tissue in this area. The episiotomy is sewn up after the delivery of the baby and placenta. Sometimes a mask with nitrous oxide is given for the mother to breath during the delivery of the baby to help if there is too much pain. The end of Stage 2 is when the baby is fully delivered. Then when the umbilical cord stops pulsating it is clamped and cut. Third Stage The third stage begins after the baby is completely delivered and ends after the placenta (afterbirth)  is delivered. This usually takes 5 to 30 minutes. After the placenta is delivered, a medication is given either by intravenous or injection to help contract the uterus and prevent bleeding. The third stage is not painful and pain medication is usually not necessary. If an episiotomy was done, it is repaired at this time. After the delivery, the mother is watched and monitored closely for 1 to 2 hours to make sure there is no postpartum bleeding (hemorrhage). If there is a lot of bleeding, medication is given to contract the uterus and stop the bleeding. Document  Released: 07/13/2008 Document Re-Released: 10/26/2009 [facility] Patient Information 2011 [place], [place].

## 2011-07-22 NOTE — Progress Notes (Signed)
S: Increase in back, and hip pain. Have not been taking any pain medications. Feeling "down". No SI/HI  She reports pain and contractions at night. Denies vaginal d/c or LOF.Vertex confirmed by ultrasound. O: see flow sheet  A/P: 1) pregnancy - doing well.  Breastfeeding reviewed.  2) HSV- Denies current lesions.  Taking prophylactic valacyclovir.  3) BPAD- Well controlled.  Pt followed by psych - currently off meds.  Lots of support from boyfriend and family. 4) Circ: desired. Gave pt phone number to [facility] to arrange outpt circ. 5) Feeding- breast 6) Contraception- patch/ring 7)  F/u- 1 weeks with Dr. Armen Pickup

## 2011-07-26 ENCOUNTER — Inpatient Hospital Stay (HOSPITAL_COMMUNITY)
Admission: AD | Admit: 2011-07-26 | Discharge: 2011-07-26 | Disposition: A | Discharge status: Home / Self Care | Payer: Medicaid Other | Source: Ambulatory Visit | Attending: Obstetrics & Gynecology | Admitting: Obstetrics & Gynecology

## 2011-07-26 ENCOUNTER — Encounter (HOSPITAL_COMMUNITY): Payer: Self-pay | Admitting: *Deleted

## 2011-07-26 DIAGNOSIS — O479 False labor, unspecified: Secondary | ICD-10-CM

## 2011-07-26 HISTORY — DX: Other specified health status: Z78.9

## 2011-07-26 HISTORY — DX: Herpesviral infection, unspecified: B00.9

## 2011-07-26 MED ORDER — GI COCKTAIL ~~LOC~~
30.0000 mL | Freq: Once | ORAL | Status: AC
  Administered 2011-07-26: 30 mL via ORAL
  Filled 2011-07-26: qty 30

## 2011-07-26 NOTE — ED Provider Notes (Signed)
Chief Complaint:  Labor Eval   Tiffany Wong is  20 y.o. [id number].  Patient's last menstrual period was 11/01/2010..  [redacted]w[redacted]d She presents complaining of Labor Eval . Onset is described as intermittent and has been present for  3 days. States pain is sharp and stabbing. Constant burning/"ripping" pain in epigastric area. Reports nausea with contractions, no vomiting. Denies LOF or blding  Obstetrical/Gynecological History: OB History    Grav Para Term Preterm Abortions TAB SAB Ect Mult Living   3 0 0 0 2 0 2 0 0 0       Past Medical History: Past Medical History  Diagnosis Date  . Anemia   . Mental disorder     BPAD - suicide attempt 2008  . HSV infection   . Herpes     last outbreak at 30 weeks  . No pertinent past medical history     Past Surgical History: No past surgical history on file.  Family History: Family History  Problem Relation Age of Onset  . Thyroid disease Maternal Aunt   . Hypertension Maternal Aunt   . Lupus Maternal Aunt   . Thyroid disease Maternal Uncle   . Hypertension Maternal Uncle   . Hypertension Maternal Grandmother   . Cancer Maternal Grandmother   . Lupus Mother   . Inflammatory bowel disease Father   . Liver disease Father     Social History: History  Substance Use Topics  . Smoking status: Current Everyday Smoker -- 0.5 packs/day    Types: Cigarettes  . Smokeless tobacco: Not on file  . Alcohol Use: No    Allergies:  Allergies  Allergen Reactions  . Aspirin Other (See Comments)    Nose Bleeds    Prescriptions prior to admission  Medication Sig Dispense Refill  . Prenatal Vit-Fe Psac Cmplx-FA (PRENATAL MULTIVITAMIN) 60-1 MG tablet Take 1 tablet by mouth daily with breakfast.  100 tablet  3  . valACYclovir (VALTREX) 500 MG tablet One tab daily for the next 3 days. Take immediately if you have symptoms of an outbreak. Start taking regularly at 35 weeks.  30 tablet  1    Review of Systems - Negative except for what is has  been reviewed in the HPI  Physical Exam   Blood pressure 116/64, pulse 103, temperature 97.6 F (36.4 C), resp. rate 20, height 5\' 1"  (1.549 m), weight 60.555 kg (133 lb 8 oz), last menstrual period 11/01/2010.  General: General appearance - alert, well appearing, and in no distress and oriented to person, place, and time Mental status - alert, oriented to person, place, and time, normal mood, behavior, speech, dress, motor activity, and thought processes, affect appropriate to mood Abdomen - Gravid, nontender Focused Gynecological Exam: closed/thinning/vtx/-2 FHR:   ED Course: Will medicate with GI cocktail   Assessment: False Labor Patient Active Problem List  Diagnoses  . Supervision of normal first pregnancy  . Tobacco use complicating pregnancy or childbirth  . Genital herpes complicating pregnancy  . Bipolar affective disorder  . Substance abuse in remission  . Vaginal yeast infection    Plan: Discharge home with labor precautions FU as scheduled at the [facility]  [name] E. 07/26/2011,11:29 AM

## 2011-07-29 ENCOUNTER — Ambulatory Visit (INDEPENDENT_AMBULATORY_CARE_PROVIDER_SITE_OTHER): Payer: Medicaid Other | Admitting: Family Medicine

## 2011-07-29 DIAGNOSIS — Z3490 Encounter for supervision of normal pregnancy, unspecified, unspecified trimester: Secondary | ICD-10-CM

## 2011-07-29 DIAGNOSIS — Z331 Pregnant state, incidental: Secondary | ICD-10-CM

## 2011-07-29 MED ORDER — POLYETHYLENE GLYCOL 3350 17 GM/SCOOP PO POWD: 17.0000 g | Freq: Every day | ORAL | Status: AC

## 2011-07-29 MED ORDER — RANITIDINE HCL 150 MG PO TABS: 150.0000 mg | ORAL_TABLET | Freq: Two times a day (BID) | ORAL | Status: DC

## 2011-07-29 NOTE — Patient Instructions (Signed)
It was a pleasure to see you today!  I am sending a prescription for Zantac 150mg , take 1 tablet twice daily for the pain in the upper part of your abdomen.   Please keep your next OB appointment on the 18th of October.  If you are having contractions that come every 3-5 minutes and are hard enough to make it difficult to talk, for over 1 hour, please go to the MAU of [facility].  If you feel a loss of fluid (either a gush or a slow trickle),this may be a sign that your water is broken and you should be seen immediately at the MAU.    If you notice herpes lesions, please call our office to let us know.

## 2011-07-29 NOTE — Progress Notes (Signed)
Seen today in OB clinic; was seen in MAU on 10/08 for epigastric pain "like the baby is tearing my muscles apart".  Got better with GI cocktail, but has resumed since then.  Not able to tolerate TUMS (make me feel like I'll vomit").  No emesis now.  Good FM and no vaginal bleeding, no HSV lesions reported. Taking valacyclovir daily as prescribed, as well as PNV with Fe.  Resumed Ambien for sleep.  Does not feel depressed, continues to see therapist and has strong social supports. Is excited about son, "Tiffany Wong". Plans to breast feed. The CTX of 2 days ago have stopped.  No LOF. Plan for repeat OB visit in 1 week; at that time may need to schedule NST/BPP between 40 and 41 weeks.  Labs (cervical cx and GBS) reviewed, both negative.  Tiffany Wong

## 2011-08-05 ENCOUNTER — Telehealth: Payer: Self-pay | Admitting: Family Medicine

## 2011-08-05 ENCOUNTER — Encounter: Payer: Self-pay | Admitting: Family Medicine

## 2011-08-05 ENCOUNTER — Encounter: Payer: Medicaid Other | Admitting: Family Medicine

## 2011-08-05 ENCOUNTER — Inpatient Hospital Stay (HOSPITAL_COMMUNITY)
Admission: AD | Admit: 2011-08-05 | Discharge: 2011-08-05 | Disposition: A | Discharge status: Home / Self Care | Payer: Medicaid Other | Source: Ambulatory Visit | Attending: Family Medicine | Admitting: Family Medicine

## 2011-08-05 DIAGNOSIS — O479 False labor, unspecified: Secondary | ICD-10-CM

## 2011-08-05 MED ORDER — HYDROXYZINE HCL 25 MG PO TABS
25.0000 mg | ORAL_TABLET | Freq: Three times a day (TID) | ORAL | Status: DC | PRN
  Administered 2011-08-05: 25 mg via ORAL
  Filled 2011-08-05: qty 1

## 2011-08-05 MED ORDER — OXYCODONE-ACETAMINOPHEN 5-325 MG PO TABS
1.0000 | ORAL_TABLET | Freq: Once | ORAL | Status: AC
  Administered 2011-08-05: 1 via ORAL
  Filled 2011-08-05: qty 1

## 2011-08-05 MED ORDER — CYCLOBENZAPRINE HCL 5 MG PO TABS: 5.0000 mg | ORAL_TABLET | Freq: Three times a day (TID) | ORAL | Status: DC | PRN

## 2011-08-05 NOTE — ED Provider Notes (Signed)
History   The patient is a 20 y.o. year old G18P0020 female at [redacted]w[redacted]d weeks gestation who presents to MAU reporting back pain, irreg UCs, baby kicking her ribs and having difficulty w/ ambulation and self care due to these discomforts. She reports pos FM and denies LOF or VB. Pt tearful.   CSN: [id number] Arrival date & time: 08/05/2011 11:35 AM  OB History    Grav Para Term Preterm Abortions TAB SAB Ect Mult Living   3 0 0 0 2 0 2 0 0 0      # Outc Date GA Lbr Len/2nd Wgt Sex Del Anes PTL Lv   1 SAB 11/05           Comments: SAB - so early she did not know she was pregnancy   2 SAB 6/07 [redacted]w[redacted]d          3 CUR               Chief Complaint  Patient presents with  . Labor Eval    (Consider location/radiation/quality/duration/timing/severity/associated sxs/prior treatment) HPI  Past Medical History  Diagnosis Date  . Anemia   . Mental disorder     BPAD - suicide attempt 2008  . HSV infection   . Herpes     last outbreak at 30 weeks  . No pertinent past medical history     No past surgical history on file.  Family History  Problem Relation Age of Onset  . Thyroid disease Maternal Aunt   . Hypertension Maternal Aunt   . Lupus Maternal Aunt   . Thyroid disease Maternal Uncle   . Hypertension Maternal Uncle   . Hypertension Maternal Grandmother   . Cancer Maternal Grandmother   . Lupus Mother   . Inflammatory bowel disease Father   . Liver disease Father     History  Substance Use Topics  . Smoking status: Current Everyday Smoker -- 0.5 packs/day    Types: Cigarettes  . Smokeless tobacco: Not on file  . Alcohol Use: No    OB History    Grav Para Term Preterm Abortions TAB SAB Ect Mult Living   3 0 0 0 2 0 2 0 0 0       Review of Systems:Otherwise neg  Allergies  Aspirin  Home Medications  No current outpatient prescriptions on file.  BP 126/62  Pulse 98  Temp(Src) 98.1 F (36.7 C) (Oral)  Resp 20  Ht 5\' 1"  (1.549 m)  Wt 61.236 kg (135 lb)  BMI  25.51 kg/m2  LMP 11/01/2010  Physical Exam Dilation: Closed Effacement (%): 50 Cervical Position: Middle Station: -3 Presentation: Undeterminable Exam by:: M.Topp,RN Vertex to leopolds FHR category I Toco: irreg mild UC's ED Course  Procedures (including critical care time)  MDM  Assessment: 1. Prodromal labor/Normal discomforts of third trimester 2. FHR category I 3. 39.4 week IUP  Plan 1. D/C home 2. Call Family Practice to schedule F/U appt 3. Labor p[recautios, FKCs 4. Rx Flexeril  Dorathy Kinsman 08/05/2011 12:41 PM

## 2011-08-05 NOTE — Telephone Encounter (Signed)
Called pt at home. See she cancelled her appt for today. Asked her to make prenatal  f/u for tomorrow or early next week. Left voicemail.

## 2011-08-09 NOTE — ED Provider Notes (Signed)
Chart reviewed and agree with management and plan.  

## 2011-08-10 DIAGNOSIS — Z3490 Encounter for supervision of normal pregnancy, unspecified, unspecified trimester: Secondary | ICD-10-CM | POA: Insufficient documentation

## 2011-08-10 NOTE — Assessment & Plan Note (Signed)
Singleton pregnancy.  See Vitals and Notes for assessment and plan.

## 2011-08-11 ENCOUNTER — Encounter (HOSPITAL_COMMUNITY): Payer: Self-pay | Admitting: *Deleted

## 2011-08-11 ENCOUNTER — Inpatient Hospital Stay (EMERGENCY_DEPARTMENT_HOSPITAL)
Admission: AD | Admit: 2011-08-11 | Discharge: 2011-08-12 | Disposition: A | Discharge status: Home / Self Care | Payer: Medicaid Other | Source: Ambulatory Visit | Attending: Obstetrics and Gynecology | Admitting: Obstetrics and Gynecology

## 2011-08-11 DIAGNOSIS — O4700 False labor before 37 completed weeks of gestation, unspecified trimester: Secondary | ICD-10-CM

## 2011-08-11 DIAGNOSIS — IMO0002 Reserved for concepts with insufficient information to code with codable children: Secondary | ICD-10-CM

## 2011-08-11 DIAGNOSIS — O479 False labor, unspecified: Secondary | ICD-10-CM | POA: Insufficient documentation

## 2011-08-11 DIAGNOSIS — Z3400 Encounter for supervision of normal first pregnancy, unspecified trimester: Secondary | ICD-10-CM

## 2011-08-11 NOTE — Progress Notes (Signed)
Pt reports contractions x 4 hours, reports leaking clear fluid off/on since yesterday. Denies problems with the preg,

## 2011-08-11 NOTE — ED Provider Notes (Signed)
Chief Complaint:  Labor Eval   Tiffany Wong is  20 y.o. G3P0020 at [redacted]w[redacted]d presents complaining of Labor Eval .  She states regular, every 2-3 minutes contractions associated with none vaginal bleeding, intact membranes, along with active fetal movement. Conttractions for the past 5-6 hours, now closer together  Obstetrical/Gynecological History: Menstrual History: OB History    Grav Para Term Preterm Abortions TAB SAB Ect Mult Living   3 0 0 0 2 0 2 0 0 0       Patient's last menstrual period was 11/01/2010.     Past Medical History: Past Medical History  Diagnosis Date  . Anemia   . Mental disorder     BPAD - suicide attempt 2008  . HSV infection   . Herpes     last outbreak at 30 weeks  . No pertinent past medical history     Past Surgical History: History reviewed. No pertinent past surgical history.  Family History: Family History  Problem Relation Age of Onset  . Thyroid disease Maternal Aunt   . Hypertension Maternal Aunt   . Lupus Maternal Aunt   . Thyroid disease Maternal Uncle   . Hypertension Maternal Uncle   . Hypertension Maternal Grandmother   . Cancer Maternal Grandmother   . Lupus Mother   . Inflammatory bowel disease Father   . Liver disease Father     Social History: History  Substance Use Topics  . Smoking status: Current Everyday Smoker -- 0.5 packs/day    Types: Cigarettes  . Smokeless tobacco: Not on file  . Alcohol Use: No    Allergies:  Allergies  Allergen Reactions  . Aspirin Other (See Comments)    Nose Bleeds    Meds:  Prescriptions prior to admission  Medication Sig Dispense Refill  . cyclobenzaprine (FLEXERIL) 5 MG tablet Take 1-2 tablets (5-10 mg total) by mouth 3 (three) times daily as needed for muscle spasms.  30 tablet  0  . prenatal vitamin w/FE, FA (PRENATAL 1 + 1) 27-1 MG TABS Take 1 tablet by mouth daily.        . promethazine (PHENERGAN) 25 MG tablet Take 25 mg by mouth daily as needed. For nausea.      .  ranitidine (ZANTAC) 150 MG tablet Take 1 tablet (150 mg total) by mouth 2 (two) times daily.  60 tablet  1  . valACYclovir (VALTREX) 500 MG tablet Take 500 mg by mouth daily.        Marland Kitchen zolpidem (AMBIEN) 10 MG tablet Take 10 mg by mouth at bedtime as needed. For insomnia.         Review of Systems - Please refer to the aforementioned patients' reports.     Physical Exam  Blood pressure 113/64, pulse 90, temperature 97.4 F (36.3 C), resp. rate 18, height 5\' 1"  (1.549 m), weight 63.05 kg (139 lb), last menstrual period 11/01/2010, SpO2 99.00%. GENERAL: Well-developed, well-nourished female in no acute distress.  LUNGS: Clear to auscultation bilaterally.  HEART: Regular rate and rhythm. ABDOMEN: Soft, nontender, nondistended, gravid.  EXTREMITIES: Nontender, no edema, 2+ distal pulses. Cervical Exam: Dilatation 0cm   Effacement 50%   Station -2   Presentation: cephalic FHT:  Baseline rate 140 bpm   Variability moderate  Accelerations present   Decelerations none Contractions: Every 2-4 mins   Labs: No results found for this or any previous visit (from the past 24 hour(s)). Imaging Studies:  No results found.  Assessment: Tiffany Wong is  20 y.o. [id number]  at [redacted]w[redacted]d presents with prodromal vs early labor.  Plan: Offered pt to walk here and recheck or to go home and come back when contractions got stronger.  Opts to go home as they live close by.  CRESENZO-DISHMAN,Corra Kaine 10/24/201211:49 PM

## 2011-08-11 NOTE — ED Notes (Signed)
Drenda Freeze Cres-Dishmon in to see pt.

## 2011-08-11 NOTE — Progress Notes (Signed)
Assuming care from Jeris Penta RN

## 2011-08-12 ENCOUNTER — Inpatient Hospital Stay (HOSPITAL_COMMUNITY)
Admission: AD | Admit: 2011-08-12 | Discharge: 2011-08-12 | Disposition: A | Discharge status: Home / Self Care | Payer: Medicaid Other | Source: Ambulatory Visit | Attending: Obstetrics and Gynecology | Admitting: Obstetrics and Gynecology

## 2011-08-12 ENCOUNTER — Encounter (HOSPITAL_COMMUNITY): Payer: Self-pay | Admitting: Family

## 2011-08-12 ENCOUNTER — Telehealth: Payer: Self-pay | Admitting: Family Medicine

## 2011-08-12 DIAGNOSIS — Z3400 Encounter for supervision of normal first pregnancy, unspecified trimester: Secondary | ICD-10-CM

## 2011-08-12 DIAGNOSIS — O479 False labor, unspecified: Secondary | ICD-10-CM | POA: Insufficient documentation

## 2011-08-12 DIAGNOSIS — IMO0002 Reserved for concepts with insufficient information to code with codable children: Secondary | ICD-10-CM

## 2011-08-12 DIAGNOSIS — O4700 False labor before 37 completed weeks of gestation, unspecified trimester: Secondary | ICD-10-CM

## 2011-08-12 MED ORDER — HYDROXYZINE PAMOATE 25 MG PO CAPS: 25.0000 mg | ORAL_CAPSULE | Freq: Three times a day (TID) | ORAL | Status: DC | PRN

## 2011-08-12 MED ORDER — PROMETHAZINE HCL 25 MG PO TABS
25.0000 mg | ORAL_TABLET | Freq: Four times a day (QID) | ORAL | Status: DC | PRN
  Administered 2011-08-12: 25 mg via ORAL
  Filled 2011-08-12: qty 1

## 2011-08-12 MED ORDER — OXYCODONE-ACETAMINOPHEN 5-325 MG PO TABS
1.0000 | ORAL_TABLET | ORAL | Status: DC | PRN
  Administered 2011-08-12: 1 via ORAL
  Filled 2011-08-12: qty 1

## 2011-08-12 MED ORDER — FLUCONAZOLE 150 MG PO TABS
150.0000 mg | ORAL_TABLET | Freq: Once | ORAL | Status: AC
  Administered 2011-08-12: 150 mg via ORAL
  Filled 2011-08-12: qty 1

## 2011-08-12 NOTE — ED Provider Notes (Signed)
History     Chief Complaint  Patient presents with  . Labor Eval   HPI  The patient presents at 40.[redacted] weeks EGA for labor evaluation. She was discharged several hours ago as her cervix was not dilated and her membranes were intact.   Past Medical History  Diagnosis Date  . Anemia   . Mental disorder     BPAD - suicide attempt 2008  . HSV infection   . Herpes     last outbreak at 30 weeks  . No pertinent past medical history     No past surgical history on file.  Family History  Problem Relation Age of Onset  . Thyroid disease Maternal Aunt   . Hypertension Maternal Aunt   . Lupus Maternal Aunt   . Thyroid disease Maternal Uncle   . Hypertension Maternal Uncle   . Hypertension Maternal Grandmother   . Cancer Maternal Grandmother   . Lupus Mother   . Inflammatory bowel disease Father   . Liver disease Father     History  Substance Use Topics  . Smoking status: Current Everyday Smoker -- 0.5 packs/day    Types: Cigarettes  . Smokeless tobacco: Not on file  . Alcohol Use: No    Allergies:  Allergies  Allergen Reactions  . Aspirin Other (See Comments)    Nose Bleeds    Prescriptions prior to admission  Medication Sig Dispense Refill  . cyclobenzaprine (FLEXERIL) 5 MG tablet Take 1-2 tablets (5-10 mg total) by mouth 3 (three) times daily as needed for muscle spasms.  30 tablet  0  . prenatal vitamin w/FE, FA (PRENATAL 1 + 1) 27-1 MG TABS Take 1 tablet by mouth daily.        . promethazine (PHENERGAN) 25 MG tablet Take 25 mg by mouth daily as needed. For nausea.      . ranitidine (ZANTAC) 150 MG tablet Take 1 tablet (150 mg total) by mouth 2 (two) times daily.  60 tablet  1  . valACYclovir (VALTREX) 500 MG tablet Take 500 mg by mouth daily.        Marland Kitchen zolpidem (AMBIEN) 10 MG tablet Take 10 mg by mouth at bedtime as needed. For insomnia.         Review of Systems  Gastrointestinal: Positive for abdominal pain.  All other systems reviewed and are  negative.   Physical Exam   Blood pressure 111/65, pulse 66, temperature 97.2 F (36.2 C), resp. rate 18, height 5\' 1"  (1.549 m), weight 63.05 kg (139 lb), last menstrual period 11/01/2010.  Physical Exam  Constitutional: She is oriented to person, place, and time. She appears well-developed and well-nourished.  HENT:  Head: Normocephalic.  Neck: Normal range of motion. Neck supple.  Cardiovascular: Normal rate, regular rhythm and normal heart sounds.   Respiratory: Effort normal and breath sounds normal.  Genitourinary: There is no lesion on the right labia. There is no lesion on the left labia. No bleeding around the vagina. Vaginal discharge (mucusy) found.       Cervix - closed  Neurological: She is alert and oriented to person, place, and time.  Skin: Skin is warm and dry.  FHR - 140's, +accels, reactive Toco - 3-6  MAU Course  Procedures  No cervical change after 3 hour observation  Assessment and Plan  Early Labor Fetal Well-Being - Category I HSV  Plan: DC to home RX Vistaril Restart Acyclovir Reviewed signs of active labor  [name] 08/12/2011, 8:50 AM

## 2011-08-12 NOTE — Progress Notes (Signed)
To R lateral after sve

## 2011-08-12 NOTE — Progress Notes (Signed)
Written and verbal d/c instructions gi ven and understanding voiced. Pt d/c ambulatory with family. E-signature did not work.

## 2011-08-12 NOTE — Telephone Encounter (Signed)
Called pt at home today. Spoke to her. She is scheduled to come to clinic to see one of my partners tomorrow. Stressed importance of keeping appointment. Scheduled induction for 08/16/11 at 7:30 PM.

## 2011-08-12 NOTE — ED Provider Notes (Signed)
History     Chief Complaint  Patient presents with  . Labor Eval   HPI  The patient presents at 40.[redacted] weeks EGA for labor evaluation. She was discharged several hours ago as her cervix was not dilated and her membranes were intact. She notes increasing frequency and strength of contractions.   OB History    Grav Para Term Preterm Abortions TAB SAB Ect Mult Living   3 0 0 0 2 0 2 0 0 0       Past Medical History  Diagnosis Date  . Anemia   . Mental disorder     BPAD - suicide attempt 2008  . HSV infection   . Herpes     last outbreak at 30 weeks  . No pertinent past medical history     No past surgical history on file.  Family History  Problem Relation Age of Onset  . Thyroid disease Maternal Aunt   . Hypertension Maternal Aunt   . Lupus Maternal Aunt   . Thyroid disease Maternal Uncle   . Hypertension Maternal Uncle   . Hypertension Maternal Grandmother   . Cancer Maternal Grandmother   . Lupus Mother   . Inflammatory bowel disease Father   . Liver disease Father     History  Substance Use Topics  . Smoking status: Current Everyday Smoker -- 0.5 packs/day    Types: Cigarettes  . Smokeless tobacco: Not on file  . Alcohol Use: No    Allergies:  Allergies  Allergen Reactions  . Aspirin Other (See Comments)    Nose Bleeds    Prescriptions prior to admission  Medication Sig Dispense Refill  . cyclobenzaprine (FLEXERIL) 5 MG tablet Take 1-2 tablets (5-10 mg total) by mouth 3 (three) times daily as needed for muscle spasms.  30 tablet  0  . prenatal vitamin w/FE, FA (PRENATAL 1 + 1) 27-1 MG TABS Take 1 tablet by mouth daily.        . promethazine (PHENERGAN) 25 MG tablet Take 25 mg by mouth daily as needed. For nausea.      . ranitidine (ZANTAC) 150 MG tablet Take 1 tablet (150 mg total) by mouth 2 (two) times daily.  60 tablet  1  . valACYclovir (VALTREX) 500 MG tablet Take 500 mg by mouth daily.        Marland Kitchen zolpidem (AMBIEN) 10 MG tablet Take 10 mg by mouth at  bedtime as needed. For insomnia.         ROS Physical Exam   Blood pressure 123/65, pulse 84, temperature 97.2 F (36.2 C), resp. rate 18, height 5\' 1"  (1.549 m), weight 63.05 kg (139 lb), last menstrual period 11/01/2010.      Physical Exam  Cervical exam: 1cm dilated, thick, posterior   No evidence of ROM or vaginal bleeding   MAU Course  Procedures  MDM Needs repeat cervical exam for labor rule out.   Assessment and Plan  Patient will be reassessed in 1 hour. Her information has be shared with the incoming care provider.   Mat Carne 08/12/2011, 5:39 AM

## 2011-08-13 ENCOUNTER — Encounter: Payer: Medicaid Other | Admitting: Family Medicine

## 2011-08-13 ENCOUNTER — Encounter (HOSPITAL_COMMUNITY): Payer: Self-pay | Admitting: *Deleted

## 2011-08-13 ENCOUNTER — Telehealth (HOSPITAL_COMMUNITY): Payer: Self-pay | Admitting: *Deleted

## 2011-08-13 ENCOUNTER — Telehealth: Payer: Self-pay | Admitting: Family Medicine

## 2011-08-13 ENCOUNTER — Ambulatory Visit (INDEPENDENT_AMBULATORY_CARE_PROVIDER_SITE_OTHER): Payer: Medicaid Other | Admitting: Family Medicine

## 2011-08-13 ENCOUNTER — Inpatient Hospital Stay (HOSPITAL_COMMUNITY)
Admission: AD | Admit: 2011-08-13 | Discharge: 2011-08-16 | DRG: 775 | Disposition: A | Discharge status: Home / Self Care | Payer: Medicaid Other | Source: Ambulatory Visit | Attending: Obstetrics & Gynecology | Admitting: Obstetrics & Gynecology

## 2011-08-13 DIAGNOSIS — IMO0002 Reserved for concepts with insufficient information to code with codable children: Secondary | ICD-10-CM

## 2011-08-13 DIAGNOSIS — Z3400 Encounter for supervision of normal first pregnancy, unspecified trimester: Secondary | ICD-10-CM

## 2011-08-13 DIAGNOSIS — O479 False labor, unspecified: Secondary | ICD-10-CM

## 2011-08-13 DIAGNOSIS — Z331 Pregnant state, incidental: Secondary | ICD-10-CM

## 2011-08-13 DIAGNOSIS — O4290 Premature rupture of membranes, unspecified as to length of time between rupture and onset of labor, unspecified weeks of gestation: Principal | ICD-10-CM | POA: Diagnosis present

## 2011-08-13 LAB — CBC
MCH: 29.3 pg (ref 26.0–34.0)
Platelets: 170 10*3/uL (ref 150–400)
RBC: 3.86 MIL/uL — ABNORMAL LOW (ref 3.87–5.11)
RDW: 13.9 % (ref 11.5–15.5)
WBC: 14 10*3/uL — ABNORMAL HIGH (ref 4.0–10.5)

## 2011-08-13 LAB — RPR: RPR Ser Ql: NONREACTIVE

## 2011-08-13 MED ORDER — ACETAMINOPHEN 325 MG PO TABS: 650.0000 mg | ORAL_TABLET | ORAL | Status: DC | PRN

## 2011-08-13 MED ORDER — TERBUTALINE SULFATE 1 MG/ML IJ SOLN: 0.2500 mg | Freq: Once | INTRAMUSCULAR | Status: AC | PRN

## 2011-08-13 MED ORDER — OXYCODONE-ACETAMINOPHEN 5-325 MG PO TABS: 2.0000 | ORAL_TABLET | ORAL | Status: DC | PRN

## 2011-08-13 MED ORDER — ZOLPIDEM TARTRATE 10 MG PO TABS: 10.0000 mg | ORAL_TABLET | Freq: Every evening | ORAL | Status: DC | PRN

## 2011-08-13 MED ORDER — CITRIC ACID-SODIUM CITRATE 334-500 MG/5ML PO SOLN: 30.0000 mL | ORAL | Status: DC | PRN

## 2011-08-13 MED ORDER — MISOPROSTOL 25 MCG QUARTER TABLET
25.0000 ug | ORAL_TABLET | ORAL | Status: DC | PRN
  Administered 2011-08-13: 25 ug via VAGINAL
  Filled 2011-08-13 (×2): qty 0.25

## 2011-08-13 MED ORDER — OXYTOCIN 20 UNITS IN LACTATED RINGERS INFUSION - SIMPLE
125.0000 mL/h | Freq: Once | INTRAVENOUS | Status: DC
  Administered 2011-08-14: 500 mL/h via INTRAVENOUS

## 2011-08-13 MED ORDER — LACTATED RINGERS IV SOLN
500.0000 mL | INTRAVENOUS | Status: DC | PRN
  Administered 2011-08-14: 300 mL via INTRAVENOUS

## 2011-08-13 MED ORDER — ZOLPIDEM TARTRATE 5 MG PO TABS
5.0000 mg | ORAL_TABLET | Freq: Once | ORAL | Status: AC
  Administered 2011-08-13: 5 mg via ORAL
  Filled 2011-08-13: qty 1

## 2011-08-13 MED ORDER — FLEET ENEMA 7-19 GM/118ML RE ENEM: 1.0000 | ENEMA | RECTAL | Status: DC | PRN

## 2011-08-13 MED ORDER — NALBUPHINE SYRINGE 5 MG/0.5 ML
5.0000 mg | INJECTION | INTRAMUSCULAR | Status: DC | PRN
  Administered 2011-08-13: 5 mg via INTRAVENOUS
  Filled 2011-08-13 (×2): qty 0.5

## 2011-08-13 MED ORDER — LIDOCAINE HCL (PF) 1 % IJ SOLN: 30.0000 mL | INTRAMUSCULAR | Status: DC | PRN

## 2011-08-13 MED ORDER — LACTATED RINGERS IV SOLN
INTRAVENOUS | Status: DC
  Administered 2011-08-13 – 2011-08-14 (×4): via INTRAVENOUS

## 2011-08-13 MED ORDER — OXYTOCIN BOLUS FROM INFUSION
500.0000 mL | Freq: Once | INTRAVENOUS | Status: DC
Start: 2011-08-13 — End: 2011-08-14
  Filled 2011-08-13: qty 500

## 2011-08-13 MED ORDER — OXYCODONE-ACETAMINOPHEN 10-325 MG PO TABS: 1.0000 | ORAL_TABLET | ORAL | Status: DC | PRN

## 2011-08-13 MED ORDER — ONDANSETRON HCL 4 MG/2ML IJ SOLN
4.0000 mg | Freq: Four times a day (QID) | INTRAMUSCULAR | Status: DC | PRN
  Administered 2011-08-14: 4 mg via INTRAVENOUS
  Filled 2011-08-13: qty 2

## 2011-08-13 NOTE — ED Notes (Signed)
Maylon Cos CNM at bedside

## 2011-08-13 NOTE — Telephone Encounter (Signed)
Pt called resident lounge and reached me. She feels that her water broke at 1:10 PM. She reports steady flow of moderate amount of clear fluid (soakng 2 panty liners)  She also states that her contractions are close and strong in severity. She was seen at clinic today and was not in labor. Dilated FT to 1 cm. Advised pt to go to MAU for a check. Pt agreed and will present to MAU.

## 2011-08-13 NOTE — Telephone Encounter (Signed)
Preadmission screen  

## 2011-08-13 NOTE — Patient Instructions (Signed)
Take 1 tablet of the perocet and 1 ambien to see if it helps your pain. You may repeat this in 6 hours if you continue to have pain and irregular contractions.  Go to the MAU if your water breaks, you start having vaginal bleeding (not just spotting), or regular contractions.

## 2011-08-13 NOTE — Progress Notes (Signed)
Tiffany Wong is a 20 y.o. G3P0020 at [redacted]w[redacted]d admitted for rupture of membranes, contractions  Objective: BP 113/70  Pulse 95  Temp(Src) 97.9 F (36.6 C) (Oral)  Resp 20  Ht 5\' 1"  (1.549 m)  Wt 141 lb (63.957 kg)  BMI 26.64 kg/m2  LMP 11/01/2010      FHT:  FHR: 130 bpm, variability: moderate,  accelerations:  Present,  decelerations:  Absent UC:   regular, every 3-5 minutes SVE: deferred b/c SROM; will check in 2 hours  Labs: Lab Results  Component Value Date   WBC 14.0* 08/13/2011   HGB 11.3* 08/13/2011   HCT 33.5* 08/13/2011   MCV 86.8 08/13/2011   PLT 170 08/13/2011    Assessment / Plan: Spontaneous labor, progressing normally. If cervix unchanged at next check, will start pitocin or cytotec depending on how much amniotic fluid there is leaking. Fetal Wellbeing:  Category I Pain Control:  IV pain medication I/D:  n/a Anticipated MOD:  NSVD  Tiffany Wong N 08/13/2011, 6:15 PM

## 2011-08-13 NOTE — Progress Notes (Signed)
This encounter was created in error - please disregard.

## 2011-08-13 NOTE — Progress Notes (Signed)
At office today, not leaking at that time, started leaking @ 1300 and took Rx of pain med, had previous taken Flexeril, obvious leading in triage/ pink discharge

## 2011-08-13 NOTE — Progress Notes (Signed)
Tiffany Wong is a 20 y.o. G3P0020 at [redacted]w[redacted]d by  admitted for PROM  Subjective:   Objective: BP 123/69  Pulse 90  Temp(Src) 97.9 F (36.6 C) (Oral)  Resp 18  Ht 5\' 1"  (1.549 m)  Wt 63.957 kg (141 lb)  BMI 26.64 kg/m2  LMP 11/01/2010      FHT:  FHR: 145 bpm, variability: moderate,  accelerations:  Present,  decelerations:  Absent UC:   irregular, every 6 minutes SVE:    Dilation: Fingertip Effacement (%): Thick Cervical Position: Posterior Station: -3 Presentation: Vertex Exam by:: Willamson, DO  Labs: Lab Results  Component Value Date   WBC 14.0* 08/13/2011   HGB 11.3* 08/13/2011   HCT 33.5* 08/13/2011   MCV 86.8 08/13/2011   PLT 170 08/13/2011    Assessment / Plan: PROM- start cytotec for cervical ripening   Fetal Wellbeing:  Category I Anticipated MOD:  NSVD - Funches   Nelda Luckey 08/13/2011, 8:25 PM
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
## 2011-08-13 NOTE — Progress Notes (Signed)
1. Patient here in significant pain due to contractions. However, contractions are still irregular, occurring every several minutes. Went to MAU yesterday for this and sent home due to lack of cervical change. Her cervical measurements are still unchanged from yesterday. Discussed with Ob fellow Windell Moment who is currently working in MAU/L&D. Will give percocet and ambien to help her sleep and ease pain. Patient and her doola who is present given signs to go to MAU.  2. Patient is scheduled for induction in 10/29.

## 2011-08-14 ENCOUNTER — Inpatient Hospital Stay (HOSPITAL_COMMUNITY): Payer: Medicaid Other | Admitting: Anesthesiology

## 2011-08-14 ENCOUNTER — Encounter (HOSPITAL_COMMUNITY): Payer: Self-pay | Admitting: Anesthesiology

## 2011-08-14 ENCOUNTER — Encounter (HOSPITAL_COMMUNITY): Payer: Self-pay | Admitting: *Deleted

## 2011-08-14 MED ORDER — EPHEDRINE 5 MG/ML INJ
10.0000 mg | INTRAVENOUS | Status: DC | PRN
  Filled 2011-08-14 (×2): qty 4

## 2011-08-14 MED ORDER — EPHEDRINE 5 MG/ML INJ
10.0000 mg | INTRAVENOUS | Status: DC | PRN
  Filled 2011-08-14: qty 4

## 2011-08-14 MED ORDER — TERBUTALINE SULFATE 1 MG/ML IJ SOLN: 0.2500 mg | Freq: Once | INTRAMUSCULAR | Status: DC | PRN

## 2011-08-14 MED ORDER — IBUPROFEN 600 MG PO TABS
600.0000 mg | ORAL_TABLET | Freq: Four times a day (QID) | ORAL | Status: DC
  Administered 2011-08-14 – 2011-08-16 (×7): 600 mg via ORAL
  Filled 2011-08-14 (×7): qty 1

## 2011-08-14 MED ORDER — OXYTOCIN 20 UNITS IN LACTATED RINGERS INFUSION - SIMPLE
1.0000 m[IU]/min | INTRAVENOUS | Status: DC
  Administered 2011-08-14: 2 m[IU]/min via INTRAVENOUS
  Filled 2011-08-14: qty 1000

## 2011-08-14 MED ORDER — BENZOCAINE-MENTHOL 20-0.5 % EX AERO
INHALATION_SPRAY | CUTANEOUS | Status: AC
  Administered 2011-08-16: 1 via TOPICAL
  Filled 2011-08-14: qty 56

## 2011-08-14 MED ORDER — SIMETHICONE 80 MG PO CHEW: 80.0000 mg | CHEWABLE_TABLET | ORAL | Status: DC | PRN

## 2011-08-14 MED ORDER — FENTANYL 2.5 MCG/ML BUPIVACAINE 1/10 % EPIDURAL INFUSION (WH - ANES)
14.0000 mL/h | INTRAMUSCULAR | Status: DC
  Administered 2011-08-14: 14 mL/h via EPIDURAL
  Filled 2011-08-14 (×3): qty 60

## 2011-08-14 MED ORDER — DIBUCAINE 1 % RE OINT: 1.0000 "application " | TOPICAL_OINTMENT | RECTAL | Status: DC | PRN

## 2011-08-14 MED ORDER — ZOLPIDEM TARTRATE 10 MG PO TABS: 10.0000 mg | ORAL_TABLET | Freq: Every evening | ORAL | Status: DC | PRN

## 2011-08-14 MED ORDER — DIPHENHYDRAMINE HCL 50 MG/ML IJ SOLN: 12.5000 mg | INTRAMUSCULAR | Status: DC | PRN

## 2011-08-14 MED ORDER — SODIUM BICARBONATE 8.4 % IV SOLN
INTRAVENOUS | Status: DC | PRN
  Administered 2011-08-14: 5 mL via EPIDURAL

## 2011-08-14 MED ORDER — WITCH HAZEL-GLYCERIN EX PADS: 1.0000 "application " | MEDICATED_PAD | CUTANEOUS | Status: DC | PRN

## 2011-08-14 MED ORDER — FENTANYL 2.5 MCG/ML BUPIVACAINE 1/10 % EPIDURAL INFUSION (WH - ANES)
INTRAMUSCULAR | Status: DC | PRN
  Administered 2011-08-14: 14 mL/h via EPIDURAL

## 2011-08-14 MED ORDER — ZOLPIDEM TARTRATE 5 MG PO TABS: 5.0000 mg | ORAL_TABLET | Freq: Every evening | ORAL | Status: DC | PRN

## 2011-08-14 MED ORDER — LANOLIN HYDROUS EX OINT: TOPICAL_OINTMENT | CUTANEOUS | Status: DC | PRN

## 2011-08-14 MED ORDER — ONDANSETRON HCL 4 MG PO TABS: 4.0000 mg | ORAL_TABLET | ORAL | Status: DC | PRN

## 2011-08-14 MED ORDER — OXYCODONE-ACETAMINOPHEN 5-325 MG PO TABS
1.0000 | ORAL_TABLET | ORAL | Status: DC | PRN
  Administered 2011-08-14: 1 via ORAL
  Administered 2011-08-14: 2 via ORAL
  Administered 2011-08-15 – 2011-08-16 (×6): 1 via ORAL
  Filled 2011-08-14 (×2): qty 1
  Filled 2011-08-14: qty 2
  Filled 2011-08-14 (×5): qty 1

## 2011-08-14 MED ORDER — LACTATED RINGERS IV SOLN
500.0000 mL | Freq: Once | INTRAVENOUS | Status: AC
  Administered 2011-08-14: 500 mL via INTRAVENOUS

## 2011-08-14 MED ORDER — BENZOCAINE-MENTHOL 20-0.5 % EX AERO
1.0000 "application " | INHALATION_SPRAY | CUTANEOUS | Status: DC | PRN
  Administered 2011-08-16: 1 via TOPICAL

## 2011-08-14 MED ORDER — PRENATAL PLUS 27-1 MG PO TABS
1.0000 | ORAL_TABLET | Freq: Every day | ORAL | Status: DC
  Administered 2011-08-15 – 2011-08-16 (×2): 1 via ORAL
  Filled 2011-08-14 (×2): qty 1

## 2011-08-14 MED ORDER — PHENYLEPHRINE 40 MCG/ML (10ML) SYRINGE FOR IV PUSH (FOR BLOOD PRESSURE SUPPORT)
80.0000 ug | PREFILLED_SYRINGE | INTRAVENOUS | Status: DC | PRN
  Filled 2011-08-14 (×2): qty 5

## 2011-08-14 MED ORDER — PHENYLEPHRINE 40 MCG/ML (10ML) SYRINGE FOR IV PUSH (FOR BLOOD PRESSURE SUPPORT)
80.0000 ug | PREFILLED_SYRINGE | INTRAVENOUS | Status: DC | PRN
  Filled 2011-08-14: qty 5

## 2011-08-14 MED ORDER — SENNOSIDES-DOCUSATE SODIUM 8.6-50 MG PO TABS
2.0000 | ORAL_TABLET | Freq: Every day | ORAL | Status: DC
  Administered 2011-08-14 – 2011-08-15 (×2): 2 via ORAL

## 2011-08-14 MED ORDER — ONDANSETRON HCL 4 MG/2ML IJ SOLN: 4.0000 mg | INTRAMUSCULAR | Status: DC | PRN

## 2011-08-14 MED ORDER — DIPHENHYDRAMINE HCL 25 MG PO CAPS: 25.0000 mg | ORAL_CAPSULE | Freq: Four times a day (QID) | ORAL | Status: DC | PRN

## 2011-08-14 NOTE — Progress Notes (Addendum)
Tiffany Wong is a 20 y.o. G3P0020 at [redacted]w[redacted]d by admitted for PROM  Subjective: Patient in significant pain, particularly in her back; would like an epidural   Objective: BP 114/59  Pulse 81  Temp(Src) 98.2 F (36.8 C) (Axillary)  Resp 18  Ht 5\' 1"  (1.549 m)  Wt 63.957 kg (141 lb)  BMI 26.64 kg/m2  LMP 11/01/2010      FHT:  FHR: 130 bpm, variability: moderate,  accelerations:  Abscent,  decelerations:  Absent UC:   irregular, every 5 minutes SVE:   Dilation: 1 Effacement (%): 70 Station: -3 Exam by:: Dr. Natale Milch  Labs: Lab Results  Component Value Date   WBC 14.0* 08/13/2011   HGB 11.3* 08/13/2011   HCT 33.5* 08/13/2011   MCV 86.8 08/13/2011   PLT 170 08/13/2011    Assessment / Plan: Protracted latent phase  Labor: start pitocin  Fetal Wellbeing:  Category II Pain Control:  Epidural I/D:  n/a Anticipated MOD:  NSVD  Dalilah Curlin 08/14/2011, 1:29 AM

## 2011-08-14 NOTE — Progress Notes (Signed)
Tiffany Wong is a 20 y.o. G3P0020 at [redacted]w[redacted]d by  admitted for PROM  Subjective: No pain, nauseas but controlled with zofran   Objective: BP 117/63  Pulse 88  Temp(Src) 97.3 F (36.3 C) (Axillary)  Resp 18  Ht 5\' 1"  (1.549 m)  Wt 63.957 kg (141 lb)  BMI 26.64 kg/m2  SpO2 99%  LMP 11/01/2010      FHT:  FHR: 140 bpm, variability: moderate,  accelerations:  Present,  decelerations:  Present , early x 1 UC:   Clustered every 4 3-4 minutes SVE:   Dilation: 2 Effacement (%): 60 Station: 0 Exam by::  (Dr. Clinton Sawyer)  Labs: Lab Results  Component Value Date   WBC 14.0* 08/13/2011   HGB 11.3* 08/13/2011   HCT 33.5* 08/13/2011   MCV 86.8 08/13/2011   PLT 170 08/13/2011    Assessment / Plan: Augmentation of labor, progressing well s/p SROM   Fetal Wellbeing:  Category II Pain Control:  Epidural I/D:  n/a Anticipated MOD:  NSVD  Rai Severns 08/14/2011, 6:01 AM

## 2011-08-14 NOTE — Anesthesia Preprocedure Evaluation (Addendum)
Anesthesia Evaluation  Patient identified by MRN, date of birth, ID band Patient awake  General Assessment Comment  Reviewed: Allergy & Precautions, H&P , Patient's Chart, lab work & pertinent test results  Airway Mallampati: II TM Distance: >3 FB Neck ROM: full    Dental  (+) Teeth Intact   Pulmonary  clear to auscultation  + wheezing (no inhaler, no recent URI. + Smoker)      Cardiovascular regular Normal    Neuro/Psych    GI/Hepatic   Endo/Other    Renal/GU      Musculoskeletal   Abdominal   Peds  Hematology   Anesthesia Other Findings       Reproductive/Obstetrics (+) Pregnancy                          Anesthesia Physical Anesthesia Plan  ASA: II  Anesthesia Plan: Epidural   Post-op Pain Management:    Induction:   Airway Management Planned:   Additional Equipment:   Intra-op Plan:   Post-operative Plan:   Informed Consent: I have reviewed the patients History and Physical, chart, labs and discussed the procedure including the risks, benefits and alternatives for the proposed anesthesia with the patient or authorized representative who has indicated his/her understanding and acceptance.   Dental Advisory Given  Plan Discussed with:   Anesthesia Plan Comments: (Labs checked- platelets confirmed with RN in room. Fetal heart tracing, per RN, reported to be stable enough for sitting procedure. Discussed epidural, and patient consents to the procedure:  included risk of possible headache,backache, failed block, allergic reaction, and nerve injury. This patient was asked if she had any questions or concerns before the procedure started. )        Anesthesia Quick Evaluation

## 2011-08-14 NOTE — Anesthesia Procedure Notes (Signed)

## 2011-08-14 NOTE — Progress Notes (Signed)
  Dr. Armen Pickup called x2 and text message x 1 to no avail. Will let pt labor down in hopes she will call soon.

## 2011-08-14 NOTE — Progress Notes (Signed)
Post foley void, peri care given, clean ice pack and pad put in place.

## 2011-08-14 NOTE — Progress Notes (Signed)
Start Pushing

## 2011-08-14 NOTE — Progress Notes (Signed)
Tiffany Wong is a 20 y.o. G3P0020 at [redacted]w[redacted]d by ultrasound admitted for rupture of membranes  Subjective:   Objective: BP 118/76  Pulse 79  Temp(Src) 97.5 F (36.4 C) (Oral)  Resp 18  Ht 5\' 1"  (1.549 m)  Wt 63.957 kg (141 lb)  BMI 26.64 kg/m2  SpO2 99%  LMP 11/01/2010 I/O last 3 completed shifts: In: 137 [I.V.:137] Out: -  Total I/O In: 1098 [P.O.:550; I.V.:548] Out: 900 [Urine:900]  FHT:  FHR: 140's bpm, variability: moderate,  accelerations:  Present,  decelerations:  Absent UC:   regular, every 11/2 minutes SVE:   Dilation: 4.5 Effacement (%): 100 Station: +1 Exam by:: A.Davis,RN  Labs: Lab Results  Component Value Date   WBC 14.0* 08/13/2011   HGB 11.3* 08/13/2011   HCT 33.5* 08/13/2011   MCV 86.8 08/13/2011   PLT 170 08/13/2011    Assessment / Plan: Augmentation of labor, progressing well  Labor: Progressing normally Preeclampsia:  no signs or symptoms of toxicity Fetal Wellbeing:  Category I Pain Control:  Epidural I/D:  n/a Anticipated MOD:  NSVD  Tiffany Wong 08/14/2011, 12:45 PM

## 2011-08-15 MED ORDER — FLEET ENEMA 7-19 GM/118ML RE ENEM: 1.0000 | ENEMA | Freq: Once | RECTAL | Status: DC

## 2011-08-15 NOTE — Progress Notes (Signed)
  Subjective:    Patient ID: Tiffany Wong, female    DOB: 06-27-1991, 20 y.o.   MRN: [id number] Post Partum Day 1 Subjective: up ad lib, voiding, tolerating PO, + flatus and pain 5/10 when walking. Lochia like a period, and decreasing.   Objective: Blood pressure 112/74, pulse 84, temperature 98.6 F (37 C), temperature source Oral, resp. rate 18, height 5\' 1"  (1.549 m), weight 141 lb (63.957 kg), last menstrual period 11/01/2010, SpO2 99.00%, unknown if currently breastfeeding.  Physical Exam:  General: alert and cooperative Lochia: appropriate Uterine Fundus: firm DVT Evaluation: No evidence of DVT seen on physical exam.   Basename 08/13/11 1710  HGB 11.3*  HCT 33.5*    Assessment/Plan: Plan for discharge tomorrow, Discharge home, Breastfeeding, Lactation consult and Contraception patch/ring.    LOS: 2 days   Sadira Standard 08/15/2011, 9:04 AM    HPI    Review of Systems     Objective:   Physical Exam        Assessment & Plan:

## 2011-08-15 NOTE — Anesthesia Postprocedure Evaluation (Signed)
  Anesthesia Post-op Note  Patient: Tiffany Wong  Procedure(s) Performed: * No procedures listed *  Patient Location: 120  Anesthesia Type: Epidural  Level of Consciousness: awake, alert  and oriented  Airway and Oxygen Therapy: Patient Spontanous Breathing  Post-op Pain: mild  Post-op Assessment: Patient's Cardiovascular Status Stable and Respiratory Function Stable  Post-op Vital Signs: stable  Complications: No apparent anesthesia complications

## 2011-08-15 NOTE — Progress Notes (Signed)
CSW met with Pt at length due to MJ use and bipolar disorder. Pt has a good plan in place and is well-linked with many resources in community. See full assessment in shadow chart.  Vennie Homans LCSWA 08/15/2011 3:41 PM

## 2011-08-16 ENCOUNTER — Inpatient Hospital Stay (HOSPITAL_COMMUNITY): Admission: RE | Admit: 2011-08-16 | Payer: Medicaid Other | Source: Ambulatory Visit

## 2011-08-16 MED ORDER — BENZOCAINE-MENTHOL 20-0.5 % EX AERO
INHALATION_SPRAY | CUTANEOUS | Status: AC
  Filled 2011-08-16: qty 56

## 2011-08-16 NOTE — ED Provider Notes (Signed)
Agree with above note.  Tiffany Wong 08/16/2011 11:51 AM   

## 2011-08-16 NOTE — Progress Notes (Signed)
UR chart review completed.  

## 2011-08-16 NOTE — Discharge Summary (Signed)
Obstetric Discharge Summary Reason for Admission: rupture of membranes on 08/13/11 @ 40.5 weeeks EGA Prenatal Procedures: ultrasound Intrapartum Procedures: spontaneous vaginal delivery Postpartum Procedures: none Complications-Operative and Postpartum: 2nd degree perineal laceration Hemoglobin  Date Value Range Status  08/13/2011 11.3* 12.0-15.0 (g/dL) Final     HCT  Date Value Range Status  08/13/2011 33.5* 36.0-46.0 (%) Final    Discharge Diagnoses:  1. Term Pregnancy Delivered 2. 2nd Degree Perineal Laceration  3. PROM x 24 hours   Discharge Information: Date: 08/16/2011 Activity: unrestricted Diet: routine Medications: None Condition: stable Instructions: refer to practice specific booklet Discharge to: home   Newborn Data: Live born female  Birth Weight: 7 lb 0.7 oz (3195 g) APGAR: 8, 9  Home with mother.  Clinton Sawyer, Demarian Epps 08/16/2011, 7:42 AM

## 2011-08-16 NOTE — Progress Notes (Signed)
Post Partum Day 2 for SVD  Subjective: up ad lib, voiding, tolerating PO and + flatus; pain in lower abdomen which she believes is secondary to constipation - last BM 5 days ago, will take an enema   Objective: Temp:  [97.5 F (36.4 C)-98.5 F (36.9 C)] 98.5 F (36.9 C) (10/29 0553) Pulse Rate:  [82-94] 94  (10/29 0553) Resp:  [20-22] 20  (10/29 0553) BP: (110-114)/(70-77) 114/74 mmHg (10/29 0553) SpO2:  [98 %] 98 % (10/28 1416)  Physical Exam:  General: alert, cooperative and no distress Lochia: appropriate Uterine Fundus: firm DVT Evaluation: No evidence of DVT seen on physical exam. Calf/Ankle edema is present.   Basename 08/13/11 1710  HGB 11.3*  HCT 33.5*    Assessment/Plan: Discharge home, Breastfeeding and Contraception - wants to use a transdermal patch vs. nuva-ring    LOS: 3 days   Mat Carne 08/16/2011, 7:21 AM

## 2011-08-17 ENCOUNTER — Telehealth: Payer: Self-pay | Admitting: Family Medicine

## 2011-08-17 NOTE — Telephone Encounter (Signed)
Patient had a NVD this past Saturday.  Baby weighed 7 lbs.  She had an epidural and only pushed 4 or 5 times.  Since the delivery she has had pain in her hip joints and lower pelvis that she says radiates to her pubic bone.  The pain is constant and hurts worse during ambulation and when coming to a sitting or standing posiiton.  The pain has been present since her epidural wore off and is un relieved by ibuprofen.  Her baby has his first NB weight check tomorrow at 11 am.  Gave her an SDA appointment with our [place] doctor to see if there is anything stronger she can take for the pain.

## 2011-08-17 NOTE — Telephone Encounter (Signed)
Pt had baby on Saturday and had vag delivery - is having abd pain and hurts to bend over.  Wants to know if this is normal.

## 2011-08-17 NOTE — ED Provider Notes (Signed)
Attestation of Attending Supervision of Advanced Practitioner: Evaluation and management procedures were performed by the PA/NP/CNM/OB Fellow under my supervision/collaboration. Chart reviewed and agree with management and plan.  Aline Wesche V 08/17/2011 6:55 PM

## 2011-08-18 ENCOUNTER — Ambulatory Visit (INDEPENDENT_AMBULATORY_CARE_PROVIDER_SITE_OTHER): Payer: Medicaid Other | Admitting: Family Medicine

## 2011-08-18 VITALS — BP 111/75 | HR 88 | Temp 98.2°F | Ht 61.0 in | Wt 132.0 lb

## 2011-08-18 DIAGNOSIS — S01501A Unspecified open wound of lip, initial encounter: Secondary | ICD-10-CM

## 2011-08-18 DIAGNOSIS — R102 Pelvic and perineal pain unspecified side: Secondary | ICD-10-CM

## 2011-08-18 DIAGNOSIS — S01512A Laceration without foreign body of oral cavity, initial encounter: Secondary | ICD-10-CM

## 2011-08-18 DIAGNOSIS — N949 Unspecified condition associated with female genital organs and menstrual cycle: Secondary | ICD-10-CM

## 2011-08-18 HISTORY — DX: Laceration without foreign body of oral cavity, initial encounter: S01.512A

## 2011-08-18 MED ORDER — AMOXICILLIN-POT CLAVULANATE 875-125 MG PO TABS: 1.0000 | ORAL_TABLET | Freq: Two times a day (BID) | ORAL | Status: AC

## 2011-08-18 MED ORDER — OXYCODONE-ACETAMINOPHEN 5-325 MG PO TABS: 1.0000 | ORAL_TABLET | Freq: Four times a day (QID) | ORAL | Status: DC | PRN

## 2011-08-18 NOTE — Progress Notes (Signed)
  Subjective:    Patient ID: Tiffany Wong, female    DOB: 1990-12-12, 20 y.o.   MRN: [id number]  HPI 1. Pelvic pain:  Recently gave birth on 08/14/11.  Birth was NSVD, with no complications other than 2nd degree perineal laceration that was repaired.  Since being released from the hospital she has been having severe pelvic pain.  The pain is present with sitting and worse with walking.  She has also noticed some burning pain with urination and has noticed a small labial laceration, this pain is different than her pelvic pain.  She has been taking tylenol at home with no relief of her symptoms.  Her lochia has been the volume of a normal period, but the volume does wax and wane.  She has noticed a foul odor to her lochia.  She has had some nausea but She denies fever/chills.     Review of Systems     Objective:   Physical Exam  Constitutional: She appears well-developed.       Mildly distressed, difficulty getting onto exam table  Cardiovascular: Normal rate, regular rhythm and normal heart sounds.   Abdominal: Soft. Bowel sounds are normal. There is tenderness in the right lower quadrant and suprapubic area. There is rebound and guarding. There is no CVA tenderness.       Uterus firm below umbilicus and tender to palpation.   Genitourinary: Vagina normal.    There is lesion on the left labia.       Attempted speculum exam however patient had too much tenderness to advance speculum past introitus.  Bimanual exam with + CMT and adnexal tenderness R>L  Musculoskeletal:       Mild tenderness with palpation of pubic symphysis          Assessment & Plan:

## 2011-08-18 NOTE — Assessment & Plan Note (Addendum)
SHe does seem to be in quite a bit of pain.  My concern although she does not have fever, is endometritis given the amount of tenderness and foul smelling lochia.  I will treat with augmentin and percocet for pain.  I instructed her if she feels like she is getting worse she should go to [facility] hospital for further evaluation and possibility of IV antibiotics.  Differential includes osteitis pubis (but only mild tenderness along pubic symphysis) and appendicitis ( pain seems to localized more to uterus, although if worsening I would certainly consider abdominal CT).  Case precepted with Dr. Earnest Bailey

## 2011-08-18 NOTE — Patient Instructions (Signed)
It was nice seeing you today.  I am concerned about an infection of the lining of your uterus.  I would like for you to take the antibiotics I have prescribed until they are all gone.  If you feel like your symptoms are worsening please go to [facility] hospital for further evaluation.

## 2011-08-18 NOTE — Assessment & Plan Note (Signed)
Small labial laceration, no bleeding.  Advised using dermoplast spray to help with this.

## 2011-08-20 NOTE — Telephone Encounter (Signed)
Called pt to f/u pain and response to percocet/augmentin. Asked pt to call back to the clinic and leave me an update if able.

## 2011-09-27 ENCOUNTER — Ambulatory Visit (INDEPENDENT_AMBULATORY_CARE_PROVIDER_SITE_OTHER): Payer: Medicaid Other | Admitting: Family Medicine

## 2011-09-27 ENCOUNTER — Encounter: Payer: Self-pay | Admitting: Family Medicine

## 2011-09-27 VITALS — BP 98/63 | HR 90 | Temp 98.0°F | Ht 61.0 in | Wt 112.0 lb

## 2011-09-27 DIAGNOSIS — Z309 Encounter for contraceptive management, unspecified: Secondary | ICD-10-CM

## 2011-09-27 DIAGNOSIS — B009 Herpesviral infection, unspecified: Secondary | ICD-10-CM | POA: Insufficient documentation

## 2011-09-27 DIAGNOSIS — A6000 Herpesviral infection of urogenital system, unspecified: Secondary | ICD-10-CM

## 2011-09-27 LAB — POCT URINE PREGNANCY: Preg Test, Ur: NEGATIVE

## 2011-09-27 MED ORDER — PRENATAL PLUS 27-1 MG PO TABS: 1.0000 | ORAL_TABLET | Freq: Every day | ORAL | Status: DC

## 2011-09-27 MED ORDER — NORETHINDRONE 0.35 MG PO TABS: 1.0000 | ORAL_TABLET | Freq: Every day | ORAL | Status: DC

## 2011-09-27 NOTE — Progress Notes (Signed)
  Subjective:     Tiffany Wong is a 20 y.o. female who presents for a postpartum visit with her fiance and baby. She is 6 weeks postpartum following a spontaneous vaginal delivery. I have fully reviewed the prenatal and intrapartum course. The delivery was at 40 gestational weeks. Outcome: spontaneous vaginal delivery. Anesthesia: epidural. Postpartum course has been complicated by postpartum chorioamnionitis. Baby's course has been uncomplicated. Baby is feeding by breast. Bleeding red. Bowel function is abnormal: intermittent vaginal bleeding.. Bladder function is normal. Patient is sexually active. Contraception method is none. Postpartum depression screening: positive. PHQ-9 score of 11. Patient states that is is somewhat/very difficult to function. She state that her depression has improved over the last 3 weeks. At discharge is pregnant she discontinued omeprazole and Zoloft. She's been on both one and off since age 57. She is diagnosed bipolar depression at age 38. She denies suicidal ideation. She states that she is body well of the baby.    Review of Systems Pertinent items are noted in HPI.  In addition she reports pain with vaginal penetration.  Objective:    BP 98/63  Pulse 90  Temp(Src) 98 F (36.7 C) (Oral)  Ht 5\' 1"  (1.549 m)  Wt 112 lb (50.803 kg)  BMI 21.16 kg/m2  General:  alert, cooperative and no distress   Breasts:  inspection negative, no nipple discharge or bleeding, no masses or nodularity palpable  Lungs: normal work of breathing  Abdomen: soft, non-tender; bowel sounds normal; no masses,  no organomegaly   Vulva:  normal  Vagina: normal vagina and postive for scant bright red blood.  Cervix:  no lesions  Corpus: not examined  Adnexa:  normal adnexa  Rectal Exam: Not performed.        Assessment:    6 postpartum exam. Pap smear not done at today's visit.   Plan:    1. Contraception: oral progesterone-only contraceptive 2. Discussed hx of bipolar: advised  that patient may restart Lamictal with the guidance of her psychiatrist as it is safe with breastfeeding.  3. Follow up in: 3 months or as needed.

## 2011-09-27 NOTE — Patient Instructions (Signed)
Roxan Hockey and Sir,  Thank you for coming in today. Please start the birth control pills. If is safe to have sex. Use lubricant liberally.  If you would like to restart the Lamictal that would be safe with breastfeeding. Zoloft does have some adverse events with breastfeeding, but you can use it if needed. If you start Zoloft, pump 7-9 hrs later and discard.   F/u with me as needed and for routine physicals.   -Dr. Armen Pickup

## 2011-11-30 ENCOUNTER — Ambulatory Visit (INDEPENDENT_AMBULATORY_CARE_PROVIDER_SITE_OTHER): Payer: Medicaid Other | Admitting: Family Medicine

## 2011-11-30 ENCOUNTER — Other Ambulatory Visit (HOSPITAL_COMMUNITY)
Admission: RE | Admit: 2011-11-30 | Discharge: 2011-11-30 | Disposition: A | Discharge status: Home / Self Care | Payer: Medicaid Other | Source: Ambulatory Visit | Attending: Family Medicine | Admitting: Family Medicine

## 2011-11-30 ENCOUNTER — Encounter: Payer: Self-pay | Admitting: Family Medicine

## 2011-11-30 ENCOUNTER — Telehealth: Payer: Self-pay | Admitting: Family Medicine

## 2011-11-30 DIAGNOSIS — R109 Unspecified abdominal pain: Secondary | ICD-10-CM

## 2011-11-30 DIAGNOSIS — R102 Pelvic and perineal pain: Secondary | ICD-10-CM

## 2011-11-30 DIAGNOSIS — Z113 Encounter for screening for infections with a predominantly sexual mode of transmission: Secondary | ICD-10-CM | POA: Insufficient documentation

## 2011-11-30 DIAGNOSIS — N760 Acute vaginitis: Secondary | ICD-10-CM

## 2011-11-30 DIAGNOSIS — N949 Unspecified condition associated with female genital organs and menstrual cycle: Secondary | ICD-10-CM

## 2011-11-30 LAB — POCT URINE PREGNANCY: Preg Test, Ur: NEGATIVE

## 2011-11-30 LAB — POCT WET PREP (WET MOUNT)

## 2011-11-30 LAB — POCT URINALYSIS DIPSTICK
Blood, UA: NEGATIVE
Glucose, UA: NEGATIVE
Spec Grav, UA: 1.02
Urobilinogen, UA: 1
pH, UA: 7

## 2011-11-30 MED ORDER — FLUCONAZOLE 150 MG PO TABS: 150.0000 mg | ORAL_TABLET | Freq: Once | ORAL | Status: AC

## 2011-11-30 MED ORDER — METRONIDAZOLE 500 MG PO TABS: 500.0000 mg | ORAL_TABLET | Freq: Two times a day (BID) | ORAL | Status: AC

## 2011-11-30 MED ORDER — HYDROCODONE-ACETAMINOPHEN 5-325 MG PO TABS: 1.0000 | ORAL_TABLET | Freq: Three times a day (TID) | ORAL | Status: AC | PRN

## 2011-11-30 NOTE — Progress Notes (Signed)
  Subjective:    Patient ID: Tiffany Wong, female    DOB: Mar 15, 1991, 21 y.o.   MRN: [id number]  HPI #1. Abdominal pain: Patient has had abdominal pain since giving birth to her child 3 months ago. She states it is waxing and waning in nature. She said she gets there is without abdominal pain returns. She states it is almost always right lower quadrant pain. In addition to this she was diagnosed with endometritis shortly after giving birth to her son. She states that that resolved but she is continued to have residual pain since then. She has had sex with a partner and uses condoms apparently. Often experiences pain after sex. She does endorse some vaginal itching and discharge for the past several days. She has history of yeast infections especially during her pregnancy. She's had no nausea or vomiting. No anorexia. Tolerating good oral food and liquid intake. No changes in her bowel habits.  Of note she did have a last menstrual period on January 15. She started spotting about 5 days ago and stopped 2 days ago. She did have some increased pain with spotting.   Review of Systems See HPI above for review of systems.       Objective:   Physical Exam Gen:  Alert, cooperative patient who appears stated age in no acute distress.  Vital signs reviewed. Abd:  Soft/nondistended/nontender.  Good bowel sounds throughout all four quadrants.  No masses noted.  GYN:  External genitalia within normal limits.  Vaginal mucosa pink, moist, normal rugae.  Nonfriable cervix without lesions, no bleeding noted on speculum exam.  Cervix was multiparous in appearance.  Patient did have some pain on speculum exam. White discharge noted on speculum exam. Bimanual exam revealed normal, nongravid uterus.  No cervical motion tenderness. No adnexal masses bilaterally.  Wet prep and GC/Chlamydia obtained.         Assessment & Plan:

## 2011-11-30 NOTE — Patient Instructions (Signed)
We will call you with the results of the lab work.  Some will come back this afternoon and some will come back tomorrow.   Take the pain medicine 2 -3 times a day for relief.   Your pregnancy test and urine test were both completely normal.   If your pain becomes much worse, you start having nausea and vomiting, or you have any other concerns, call and let us know or head to the ED. Come back and see myself or Dr. Armen Pickup in 1 week so we can make sure you're still doing okay.   If your pain doesn't get any better we might send you for an ultrasound of your belly to see things.

## 2011-11-30 NOTE — Telephone Encounter (Signed)
Called and discussed bacterial vaginosis and yeast vaginitis infection with patient. Have sent in Diflucan and metronidazole to treat. Patient expressed gratitude and understanding.

## 2011-11-30 NOTE — Assessment & Plan Note (Signed)
This seems to be more of a chronic pain in nature. No red flags on history or by physical. We'll provide hydrocodone short-term course for pain relief. Also obtained wet prep GC chlamydia will call patient with results. If pain does not resolve would recommend abdominal/pelvic ultrasound for further evaluation. Followup with me or with her PCP in one week or sooner if symptoms do not resolve or worsen.

## 2011-12-08 ENCOUNTER — Ambulatory Visit: Payer: Medicaid Other | Admitting: Family Medicine

## 2011-12-15 ENCOUNTER — Telehealth: Payer: Self-pay | Admitting: Family Medicine

## 2011-12-15 ENCOUNTER — Other Ambulatory Visit (HOSPITAL_COMMUNITY)
Admission: RE | Admit: 2011-12-15 | Discharge: 2011-12-15 | Disposition: A | Discharge status: Home / Self Care | Payer: Medicaid Other | Source: Ambulatory Visit | Attending: Family Medicine | Admitting: Family Medicine

## 2011-12-15 ENCOUNTER — Encounter: Payer: Self-pay | Admitting: Family Medicine

## 2011-12-15 ENCOUNTER — Ambulatory Visit (INDEPENDENT_AMBULATORY_CARE_PROVIDER_SITE_OTHER): Payer: Medicaid Other | Admitting: Family Medicine

## 2011-12-15 DIAGNOSIS — B373 Candidiasis of vulva and vagina: Secondary | ICD-10-CM

## 2011-12-15 DIAGNOSIS — R102 Pelvic and perineal pain: Secondary | ICD-10-CM

## 2011-12-15 DIAGNOSIS — R109 Unspecified abdominal pain: Secondary | ICD-10-CM

## 2011-12-15 DIAGNOSIS — B3731 Acute candidiasis of vulva and vagina: Secondary | ICD-10-CM

## 2011-12-15 DIAGNOSIS — Z113 Encounter for screening for infections with a predominantly sexual mode of transmission: Secondary | ICD-10-CM | POA: Insufficient documentation

## 2011-12-15 DIAGNOSIS — A6000 Herpesviral infection of urogenital system, unspecified: Secondary | ICD-10-CM

## 2011-12-15 DIAGNOSIS — N949 Unspecified condition associated with female genital organs and menstrual cycle: Secondary | ICD-10-CM

## 2011-12-15 LAB — POCT WET PREP (WET MOUNT)

## 2011-12-15 MED ORDER — CYCLOBENZAPRINE HCL 10 MG PO TABS: 10.0000 mg | ORAL_TABLET | Freq: Three times a day (TID) | ORAL | Status: AC | PRN

## 2011-12-15 MED ORDER — NAPROXEN SODIUM 550 MG PO TABS: 550.0000 mg | ORAL_TABLET | Freq: Two times a day (BID) | ORAL | Status: DC

## 2011-12-15 NOTE — Patient Instructions (Signed)
Aylen,  Thank you for coming in. My nurses will schedule your ultrasounds.   Please take the naproxen up to twice daily and the flexeril up to three times daily.  Plan to see me in 2 weeks after your ultrasound.   Dr. Armen Pickup

## 2011-12-15 NOTE — Telephone Encounter (Signed)
Left VM on mobile. Wet prep negative. Will call back with culture results.

## 2011-12-16 ENCOUNTER — Telehealth: Payer: Self-pay | Admitting: Family Medicine

## 2011-12-16 NOTE — Progress Notes (Signed)
Subjective:     Patient ID: Tiffany Wong, female   DOB: 31-Aug-1991, 21 y.o.   MRN: [id number]  HPI 21 year old female presents for followup of pelvic pain. Since her last visit she reports that her pain has improved as 3/10 at rest. She however doesn't persistent pain that becomes worse with standing for greater than 15 minutes or walking for greater than 15 minutes. She reports pain in the right groin. She describes the pain as an ache. She said the pain is nonradiating. She admits to some nausea when the pain is severe. She denies fever, vomiting, diarrhea, constipation, Vaginal discharge or vaginal bleeding. She states that sex interest make the pain better. She took Vicodin for the pain said that helped but has since ran out.  Of note patient reports that the pain has been present since 2 days following delivery of her son. So she has been expressing some pain for the past 4 months.   Review of Systems Pertinent review of systems as per history of present illness     Objective:   Physical Exam BP 90/60  Pulse 48  Temp(Src) 97.8 F (36.6 C) (Oral)  Ht 5\' 1"  (1.549 m)  Wt 101 lb 4.8 oz (45.949 kg)  BMI 19.14 kg/m2 General appearance: alert, cooperative, no distress and thin Abdomen: Mild tenderness over right lower quadrant no rebound or guarding. also with mild tenderness to palpation right inguinal area. No masses.  Pelvic: External genitalia normal. No inguinal lymphadenopathy. Vagina and cervix normal in appearance.  scant white vaginal discharge. No cervical motion tenderness. Right adnexal tenderness. Uterus normal in size and non tender.  R hip: Full ROM, negative FABER but pain is reproducible with external rotation of the hip. Normal gait.  Neurologic: Grossly normal     Assessment:         Plan:

## 2011-12-16 NOTE — Telephone Encounter (Signed)
LMOVM informing pt of the following appts:  1. Date: Monday (12/20/11) @ 915am.     Location: [facility], go to admitting     Phone: [phone]     Special instruction: Nothing to eat or drink after Midnight  2. Date: Monday (12/20/11 @ 12:45pm     Location: [facility], go to ultrasound     Phone: [phone]     Special instructions: Drink 32oz of water starting @ 11:45am, DO NOT go to the restroom   LM with all these details, but asked that she calls back with questions.

## 2011-12-16 NOTE — Assessment & Plan Note (Signed)
A: wet prep obtained and negative for BV and yeast.

## 2011-12-16 NOTE — Assessment & Plan Note (Addendum)
A: Review last notes and genital microbiology data. Precepted the case with Dr. Delbert Harness. Suspicious of musculoskeletal etiology (hip arthritis or bursitis) but cannot completely rule out chronic PID although seems to be less likely given the patient is symptomatic for 4 months.  P: -Obtained followup wet prep today: Negative -Obtained gonorrhea and Chlamydia -Ordered pelvic and transvaginal ultrasound along with Doppler evaluation of ovarian blood flow -For pain control the naproxen twice a day and Flexeril up to 3 times daily as needed -Close followup in 2 weeks - consider blood work (ESR, CBC, RA) if U/S normal and hip radiographs.

## 2011-12-16 NOTE — Telephone Encounter (Signed)
Called patient to let her know that GC and Chlam are still negative. Left VM.

## 2011-12-16 NOTE — Assessment & Plan Note (Signed)
No active lesions physical exam. Patient has Valtrex at home that she know to takes at the onset of symptoms.

## 2011-12-16 NOTE — Telephone Encounter (Signed)
Tiffany Wong called back to speak with nurse.  Did not see any notation regarding an appt.  Please call patient back with info.

## 2011-12-20 ENCOUNTER — Inpatient Hospital Stay (HOSPITAL_COMMUNITY): Admission: RE | Admit: 2011-12-20 | Payer: Medicaid Other | Source: Ambulatory Visit

## 2011-12-20 ENCOUNTER — Ambulatory Visit (HOSPITAL_COMMUNITY): Admission: RE | Admit: 2011-12-20 | Payer: Medicaid Other | Source: Ambulatory Visit

## 2011-12-31 ENCOUNTER — Ambulatory Visit (INDEPENDENT_AMBULATORY_CARE_PROVIDER_SITE_OTHER): Payer: Medicaid Other | Admitting: Family Medicine

## 2011-12-31 ENCOUNTER — Encounter: Payer: Self-pay | Admitting: Family Medicine

## 2011-12-31 VITALS — BP 98/68 | HR 60 | Temp 98.5°F | Ht 61.0 in | Wt 100.0 lb

## 2011-12-31 DIAGNOSIS — Z309 Encounter for contraceptive management, unspecified: Secondary | ICD-10-CM

## 2011-12-31 DIAGNOSIS — N949 Unspecified condition associated with female genital organs and menstrual cycle: Secondary | ICD-10-CM

## 2011-12-31 DIAGNOSIS — R102 Pelvic and perineal pain: Secondary | ICD-10-CM

## 2011-12-31 DIAGNOSIS — F17200 Nicotine dependence, unspecified, uncomplicated: Secondary | ICD-10-CM

## 2011-12-31 DIAGNOSIS — Z720 Tobacco use: Secondary | ICD-10-CM

## 2011-12-31 LAB — POCT URINE PREGNANCY: Preg Test, Ur: NEGATIVE

## 2011-12-31 MED ORDER — CYCLOBENZAPRINE HCL 10 MG PO TABS: 10.0000 mg | ORAL_TABLET | Freq: Three times a day (TID) | ORAL | Status: AC | PRN

## 2011-12-31 NOTE — Patient Instructions (Signed)
Dear Tiffany Wong,   It was great to see you today. Thank you for coming to clinic. Please read below regarding the issues that we discussed.   1. For your abdominal pain, I refilled your cyclobenzaprine. Please only take 3 times per day TOTAL.  2. We have rescheduled your ultrasound. We also are trying to reschedule the scan for your arteries and vessels. We do not have a reason as of yet for your pain but are actively investigating.  3. Please follow up within 2 weeks with Dr. Armen Pickup.    Please call earlier if you have any questions or concerns.   Sincerely,  Dr. Tana Conch

## 2012-01-02 DIAGNOSIS — O99333 Smoking (tobacco) complicating pregnancy, third trimester: Secondary | ICD-10-CM | POA: Insufficient documentation

## 2012-01-02 DIAGNOSIS — Z720 Tobacco use: Secondary | ICD-10-CM | POA: Insufficient documentation

## 2012-01-02 NOTE — Progress Notes (Signed)
  Subjective:    Patient ID: Tiffany Wong, female    DOB: 30-Nov-1990, 21 y.o.   MRN: [id number]  HPI21 year old female presents for followup of pelvic pain.   1. Pelvic/right groin pain-Patient has been using scheduled naproxen and also has been using Flexeril. Pain located in right groin, nonradiating, described as an ache worse with activity. Interestingly, says pain better during sex. Denies fever, vomiting, diarrhea, constipation, vaginal discharge,polyuria, polydipsia or vaginal bleeding. Records show most recent GC/Chlamydia was negative She says her pain is 2/10 at baseline when on medications. If she goes for a while without Flexeril, she says her pain is a 6/10. She has been taking medication up to 4x per day although instructions say TID (updated patient to only use every 8 hours). Pain is not related to periods although she describes some dysmenorrhea in the past. She has only had 1 period since having her son (the onset of pain was 2 days after birth of son-almost 5 months ago). Patient was previously scheduled for transvaginal ultrasound but missed the appointment. Attempted to reschedule during office visit and scheduled for next Wednesday at 4pm. Nursing also trying to reschedule arterial dopplers but unsuccessful re attempt Monday.   2. Tobacco abuse-patient smoked 1/2ppd before pregnancy. Had recently been as low as 2-3 per day but is now back at 4-5 per day. Not ready to discuss quitting at this time.   Review of Systems negative except as noted in HPI      Objective:   Physical Exam  Constitutional: She is oriented to person, place, and time. She appears well-developed and well-nourished. No distress.  HENT:  Head: Normocephalic and atraumatic.  Mouth/Throat: Oropharynx is clear and moist.  Eyes: EOM are normal. Pupils are equal, round, and reactive to light.  Neck: Normal range of motion. Neck supple.  Cardiovascular: Normal rate and regular rhythm.  Exam reveals no gallop and  no friction rub.   No murmur heard. Pulmonary/Chest: Effort normal and breath sounds normal.  Abdominal: Soft. Bowel sounds are normal. She exhibits no distension and no mass. There is tenderness (mild tenderness to deep palpation in right groin. No hernia's identified. ). There is no rebound and no guarding.  Musculoskeletal: Normal range of motion. She exhibits no edema.  Neurological: She is alert and oriented to person, place, and time.  Skin: Skin is warm and dry.   BP 98/68  Pulse 60  Temp(Src) 98.5 F (36.9 C) (Oral)  Ht 5\' 1"  (1.549 m)  Wt 100 lb (45.36 kg)  BMI 18.89 kg/m2        Assessment & Plan:

## 2012-01-02 NOTE — Assessment & Plan Note (Signed)
Patient still contemplating depo-provera. She has been taking Micronor on regular basis but at wide ranges of time during the day-Urine pregnancy test negative. Advised patient to take at same time each day and also to consider other methods of contraception.

## 2012-01-02 NOTE — Assessment & Plan Note (Signed)
Patient not ready to quit/discuss quitting at present. States she knows she needs to quit but unwilling to do so.

## 2012-01-02 NOTE — Assessment & Plan Note (Addendum)
Once again, suspicious of MSK etiology as per Dr. Armen Pickup last note. Patient believes flexeril provides a lot of relief so refilled at this time.  Attempting to reschedule transvaginal ultrasound along with doppler eval of ovarian blood flow. Encouraged patient to follow up with Dr. Armen Pickup after TV U/S. As per previous note-would consider hip radiographs and blood work including CBC, ESR, rheumatoid factor. GC/Chlamyida were negative.

## 2012-01-05 ENCOUNTER — Other Ambulatory Visit: Payer: Self-pay | Admitting: Family Medicine

## 2012-01-05 ENCOUNTER — Ambulatory Visit (HOSPITAL_COMMUNITY): Admission: RE | Admit: 2012-01-05 | Payer: Medicaid Other | Source: Ambulatory Visit

## 2012-01-05 ENCOUNTER — Ambulatory Visit (HOSPITAL_COMMUNITY)
Admission: RE | Admit: 2012-01-05 | Discharge: 2012-01-05 | Disposition: A | Discharge status: Home / Self Care | Payer: Medicaid Other | Source: Ambulatory Visit | Attending: Family Medicine | Admitting: Family Medicine

## 2012-01-05 DIAGNOSIS — R1031 Right lower quadrant pain: Secondary | ICD-10-CM | POA: Insufficient documentation

## 2012-01-05 DIAGNOSIS — R102 Pelvic and perineal pain: Secondary | ICD-10-CM

## 2012-01-05 DIAGNOSIS — N949 Unspecified condition associated with female genital organs and menstrual cycle: Secondary | ICD-10-CM | POA: Insufficient documentation

## 2012-01-13 ENCOUNTER — Encounter: Payer: Self-pay | Admitting: Family Medicine

## 2012-01-13 ENCOUNTER — Telehealth: Payer: Self-pay | Admitting: Family Medicine

## 2012-01-13 NOTE — Telephone Encounter (Signed)
Message copied by Dessa Phi on Thu Jan 13, 2012  5:04 PM ------      Message from: MCDIARMID, TODD D      Created: Thu Jan 06, 2012  4:42 PM                   ----- Message -----         From: Rad Results In Interface         Sent: 01/05/2012   1:50 PM           To: Leighton Roach McDiarmid, MD

## 2012-01-13 NOTE — Telephone Encounter (Signed)
Left VM. Normal pelvic U/S. Pt to call back to schedule f/u appt for herself if she is still having pain. Also informed her about her son needing his well child check and to catch up on his shots.

## 2012-01-26 ENCOUNTER — Telehealth: Payer: Self-pay | Admitting: Family Medicine

## 2012-01-26 ENCOUNTER — Ambulatory Visit (INDEPENDENT_AMBULATORY_CARE_PROVIDER_SITE_OTHER): Payer: Medicaid Other | Admitting: Family Medicine

## 2012-01-26 ENCOUNTER — Encounter: Payer: Self-pay | Admitting: Family Medicine

## 2012-01-26 VITALS — BP 104/72 | HR 61 | Temp 97.3°F | Ht 61.0 in | Wt 96.0 lb

## 2012-01-26 DIAGNOSIS — E46 Unspecified protein-calorie malnutrition: Secondary | ICD-10-CM | POA: Insufficient documentation

## 2012-01-26 DIAGNOSIS — F319 Bipolar disorder, unspecified: Secondary | ICD-10-CM

## 2012-01-26 DIAGNOSIS — R800 Isolated proteinuria: Secondary | ICD-10-CM

## 2012-01-26 DIAGNOSIS — N949 Unspecified condition associated with female genital organs and menstrual cycle: Secondary | ICD-10-CM

## 2012-01-26 DIAGNOSIS — R102 Pelvic and perineal pain: Secondary | ICD-10-CM

## 2012-01-26 DIAGNOSIS — Z309 Encounter for contraceptive management, unspecified: Secondary | ICD-10-CM

## 2012-01-26 LAB — POCT URINALYSIS DIPSTICK
Glucose, UA: NEGATIVE
Ketones, UA: NEGATIVE
Leukocytes, UA: NEGATIVE
Nitrite, UA: NEGATIVE

## 2012-01-26 LAB — POCT WET PREP (WET MOUNT)

## 2012-01-26 LAB — POCT UA - MICROSCOPIC ONLY

## 2012-01-26 MED ORDER — ENSURE NUTRITION SHAKE PO LIQD: 8.0000 [oz_av] | Freq: Two times a day (BID) | ORAL | Status: DC

## 2012-01-26 MED ORDER — CYCLOBENZAPRINE HCL 10 MG PO TABS: 10.0000 mg | ORAL_TABLET | Freq: Three times a day (TID) | ORAL | Status: AC | PRN

## 2012-01-26 MED ORDER — MEDROXYPROGESTERONE ACETATE 150 MG/ML IM SUSP
150.0000 mg | Freq: Once | INTRAMUSCULAR | Status: AC
  Administered 2012-01-26: 150 mg via INTRAMUSCULAR

## 2012-01-26 NOTE — Assessment & Plan Note (Addendum)
4 lb weight loss in one month. Poor po intake. Patient with decreased appetite. Has had medication changes in the form of restarting lamictal and zoloft  Since our last visit. Plan to start Boost supplementation. Denies constitutional symptoms. Encourage po intake. Check CMET at f/u.

## 2012-01-26 NOTE — Assessment & Plan Note (Signed)
Restarted treatment. Managed by [facility] of the [place].

## 2012-01-26 NOTE — Assessment & Plan Note (Signed)
Negative urine pregnancy test today. Depo shot given. Patient to f/u in one week for repeat  Urine pregnancy test given unprotected sex in last week.

## 2012-01-26 NOTE — Assessment & Plan Note (Signed)
Pt with poor po intake and weight loss recently. Plan to increase intake of fluids and nutrition with boost supplementation and recheck.

## 2012-01-26 NOTE — Assessment & Plan Note (Signed)
Improved. Isolated episode of cramping yesterday resolved. Normal vaginal ultrasounds. Negative UA, wet prep with few clue cells. Plan continue flexeril prn. Continue naproxen will need to check cr at f/u visit since on NSAIDs now for one month and proteinuria. F/u symptoms at next visit if persistent. Will obtain additional blood work CBC, ESR and RA.

## 2012-01-26 NOTE — Progress Notes (Signed)
Subjective:     Patient ID: Tiffany Wong, female   DOB: May 27, 1991, 21 y.o.   MRN: [id number]  HPI 21 yo Female presents to discuss the following:   1. Pelvic pain improved with flexeril and naproxen. Patient taking naproxen 1-2 x daily. She took flexeril prn. She did not have to take it everyday. She reports heaving vaginal discharge x 4 days. She denies odor, itching, irritation or abdominal pain. She had a severe episode of cramping yesterday. She denies cramping today. She denies dysuria, urinary frequency or urinary urgency.  LMP: 12/31/11  2. Weight loss: decreased appetite resulting in 4 lbs weight loss since our last visit. Patient eats 1-2 x per day. Small meals. Little drinking as well. Patient with hx of bipolar affective disorder. She was off medication due to pregnancy. She has been restarted on Lamictal and Zoloft which is being managed by psychiatry services at the [facility] the [place]. She denies fevers, chills, nausea, vomiting and night sweats.   Review of Systems As per HPI     Objective:   Physical Exam BP 104/72  Pulse 61  Temp(Src) 97.3 F (36.3 C) (Oral)  Ht 5\' 1"  (1.549 m)  Wt 96 lb (43.545 kg)  BMI 18.14 kg/m2 General appearance: alert, cooperative and no distress Abdomen: soft, non-tender; bowel sounds normal; no masses,  no organomegaly. Maximal area of discomfort is suprapubic but there is no tenderness.  Pelvic: deferred     Assessment and Plan:  See problem list

## 2012-01-26 NOTE — Patient Instructions (Signed)
Tiffany Wong,  Thank you for coming in to see me today.  I will call you with you UA and wet prep results.

## 2012-01-26 NOTE — Telephone Encounter (Signed)
Called patient to give results of UA and wet prep.UA with >300 protein. Plan to increase fluids and recheck. Wet prep with Clue cells. Patient asymptomatic so will not treat.

## 2012-02-09 ENCOUNTER — Ambulatory Visit: Payer: Medicaid Other

## 2012-02-09 ENCOUNTER — Other Ambulatory Visit (INDEPENDENT_AMBULATORY_CARE_PROVIDER_SITE_OTHER): Payer: Medicaid Other

## 2012-02-09 DIAGNOSIS — Z309 Encounter for contraceptive management, unspecified: Secondary | ICD-10-CM

## 2012-02-09 LAB — POCT URINE PREGNANCY: Preg Test, Ur: NEGATIVE

## 2012-02-09 NOTE — Progress Notes (Signed)
Repeated u preg as per Dr. Armen Pickup note. Dewitt Hoes, MLS (ASCP)cm

## 2012-03-17 ENCOUNTER — Telehealth: Payer: Self-pay | Admitting: Family Medicine

## 2012-03-17 MED ORDER — CYCLOBENZAPRINE HCL 10 MG PO TABS: 10.0000 mg | ORAL_TABLET | Freq: Two times a day (BID) | ORAL | Status: AC | PRN

## 2012-03-17 NOTE — Telephone Encounter (Signed)
Pt saw Dr Armen Pickup 5/29 when she brought her sone in and Dr Armen Pickup told her that she would send in script for Flexeril.  Pharm still doesn't have it.   [facility] [address]

## 2012-03-17 NOTE — Telephone Encounter (Signed)
Flexeril sent in. Please let patient know.

## 2012-05-03 IMAGING — US US PELVIS COMPLETE
1 series · 13 of 25 positions shown · non-contrast
Comparison: OB ultrasound 03/23/2011

CLINICAL DATA: Right lower quadrant pelvic pain.  LMP 01/03/2012.

TRANSABDOMINAL AND TRANSVAGINAL ULTRASOUND OF PELVIS
DOPPLER ULTRASOUND OF OVARIES
TECHNIQUE: Both transabdominal and transvaginal ultrasound
examinations of the pelvis were performed. Transabdominal technique
was performed for global imaging of the pelvis including uterus,
ovaries, adnexal regions, and pelvic cul-de-sac.
It was necessary to proceed with endovaginal exam following the
transabdominal exam to visualize the ovaries/adnexa.
Color and duplex Doppler ultrasound was utilized to evaluate blood
flow to the ovaries.

[Series 1: us pelvis complete · 0.28mm/px · 13 of 67 slices shown]
[im 1/67]
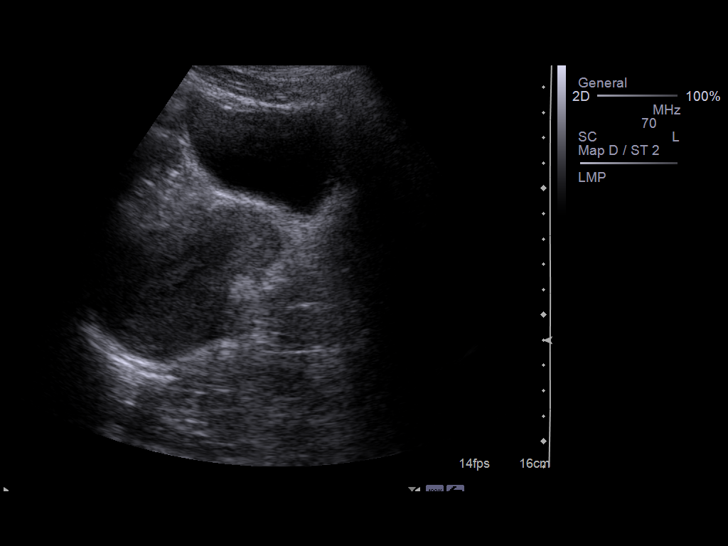
[im 6/67]
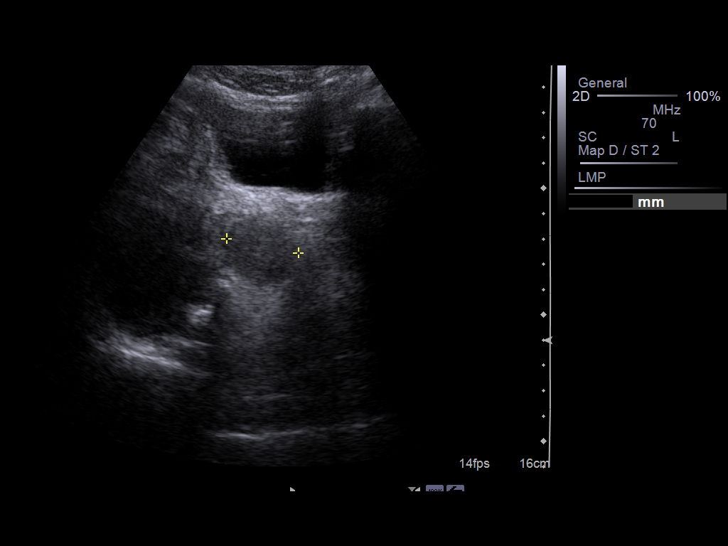
[im 12/67]
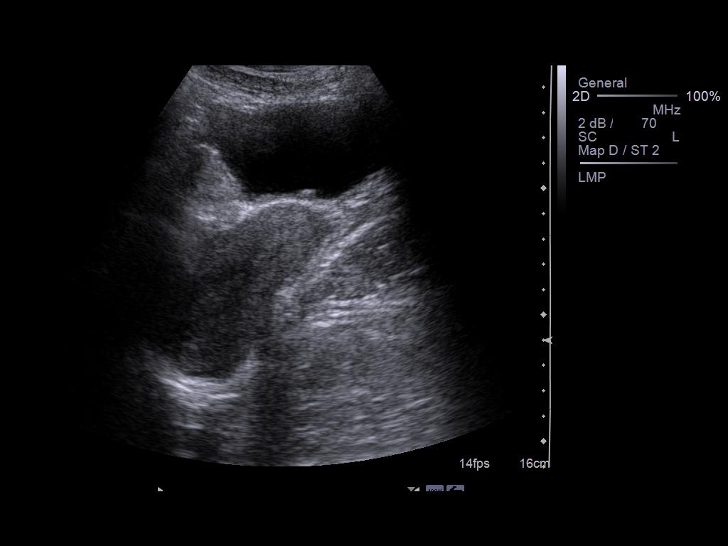
[im 17/67]
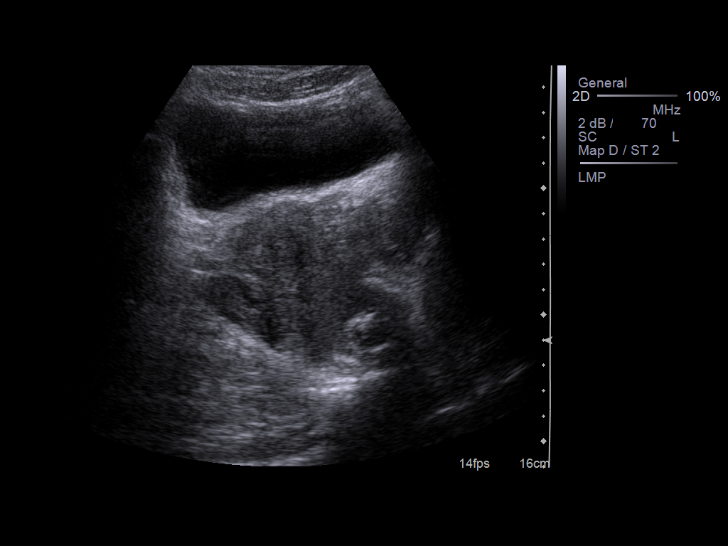
[im 23/67]
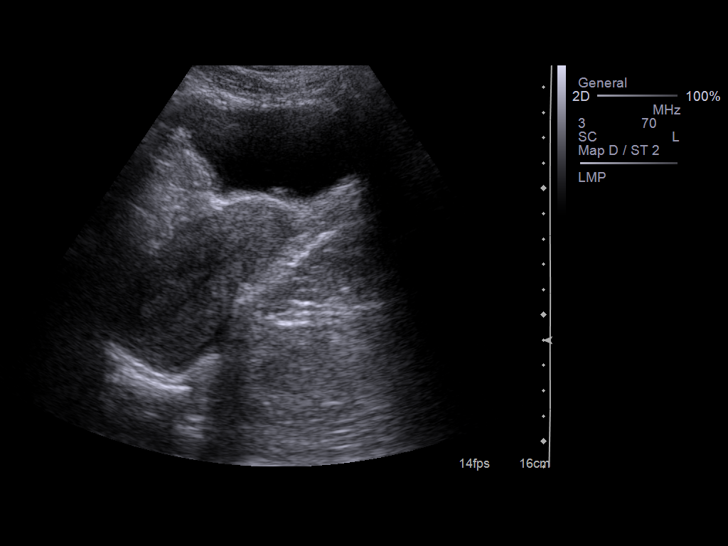
[im 28/67]
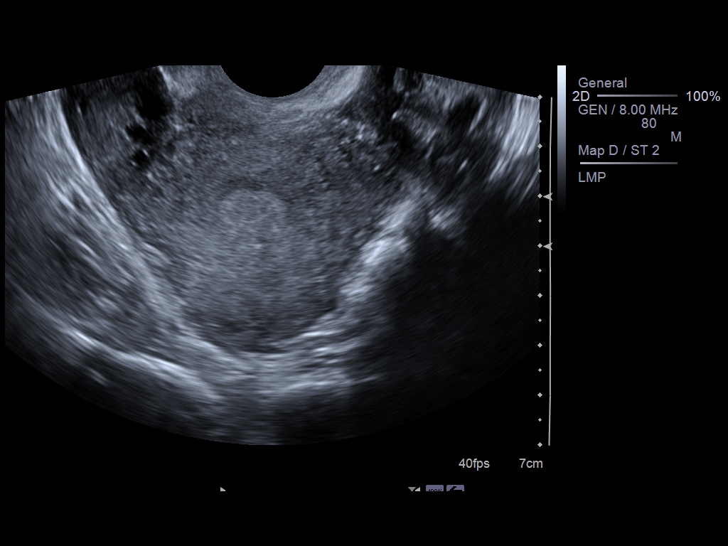
[im 34/67]
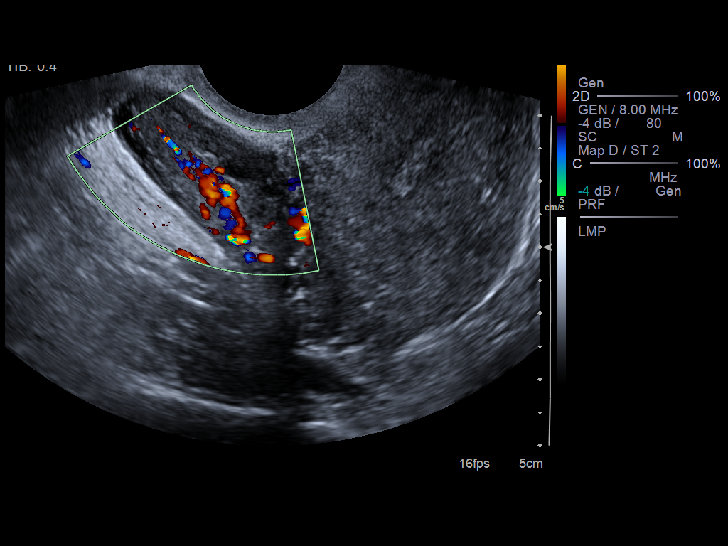
[im 39/67]
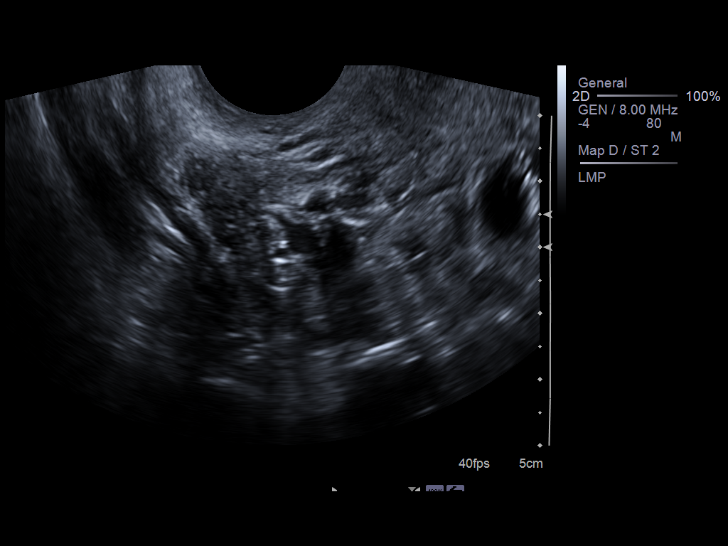
[im 45/67]
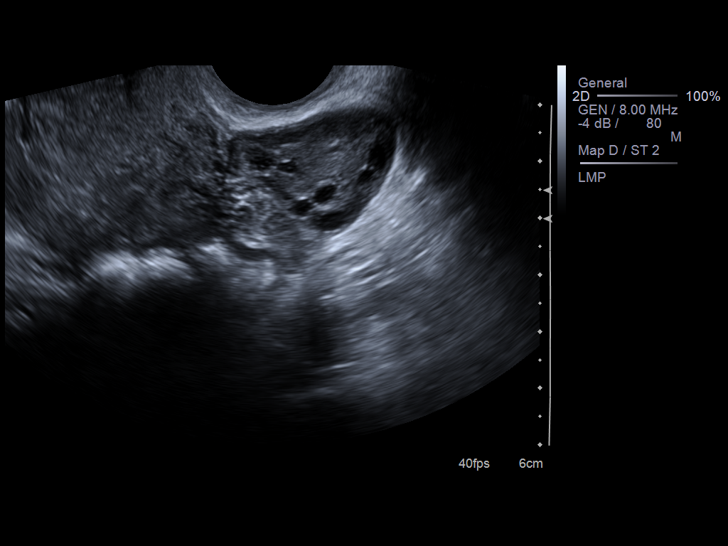
[im 50/67]
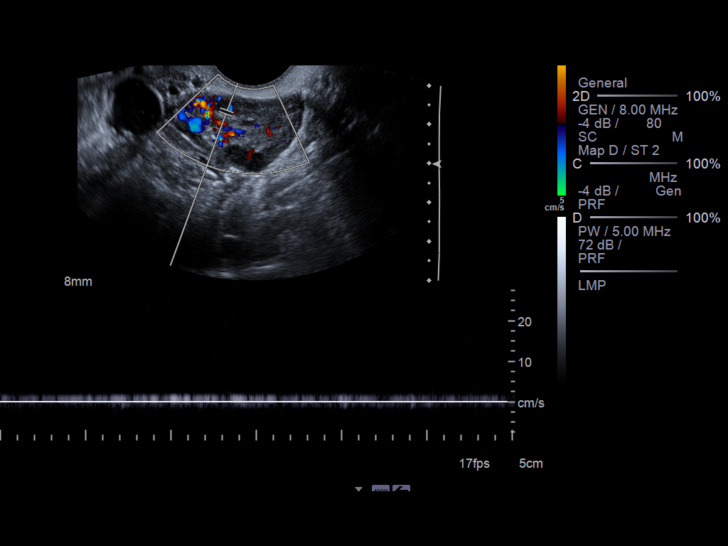
[im 56/67]
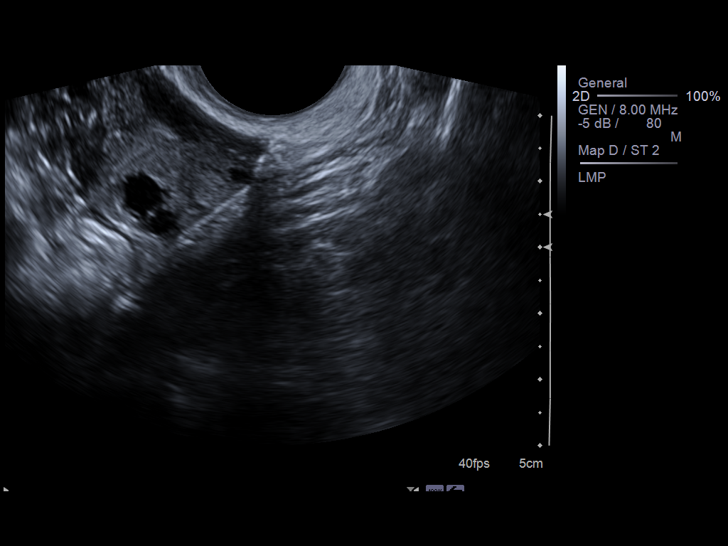
[im 61/67]
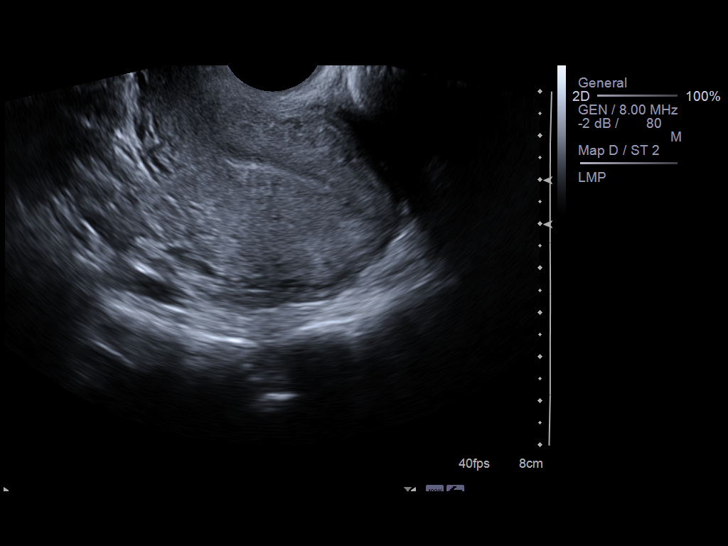
[im 67/67]
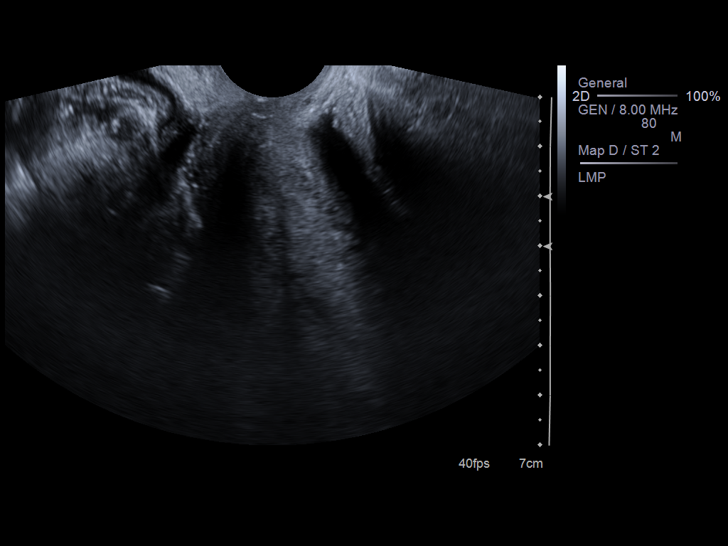

[13 of 25 positions shown; findings below may reference images not displayed]

FINDINGS: Uterus:  Normal in size and echogenicity.  Retroverted.  Measures
7.8 x 4.8 x 4.8 cm. No focal uterine mass is identified.

Endometrium:  Normal in thickness and appearance.  Measures 7 mm.

Right ovary: Normal appearance/no adnexal mass.  Measures 3.4 x
x 2.6 cm.  Normal color Doppler flow.  No cyst or mass.

Left ovary:   Normal appearance/no adnexal mass.  Measures 3.8 x
2.1 x 3.4 cm.  Normal color Doppler flow.  No cyst or mass.

Pulsed Doppler evaluation demonstrates normal low-resistance
arterial and venous waveforms in both ovaries.
IMPRESSION: Normal exam.  No evidence of pelvic mass or other significant
abnormality.

No sonographic evidence for ovarian torsion.

## 2012-05-17 ENCOUNTER — Ambulatory Visit (INDEPENDENT_AMBULATORY_CARE_PROVIDER_SITE_OTHER): Payer: Medicaid Other | Admitting: *Deleted

## 2012-05-17 DIAGNOSIS — Z309 Encounter for contraceptive management, unspecified: Secondary | ICD-10-CM

## 2012-05-17 LAB — POCT URINE PREGNANCY: Preg Test, Ur: NEGATIVE

## 2012-05-17 MED ORDER — MEDROXYPROGESTERONE ACETATE 150 MG/ML IM SUSP
150.0000 mg | Freq: Once | INTRAMUSCULAR | Status: AC
  Administered 2012-05-17: 150 mg via INTRAMUSCULAR

## 2012-05-17 NOTE — Progress Notes (Signed)
Patient is late for Depo today . Urine pregnancy test performed and is negative.  Patient states she has had sex in past 5 days is advised to return in two weeks for repeat urine pregnancy test .  Advised to use extra protection for next 7 days.

## 2012-05-29 ENCOUNTER — Other Ambulatory Visit (INDEPENDENT_AMBULATORY_CARE_PROVIDER_SITE_OTHER): Payer: Medicaid Other

## 2012-05-29 DIAGNOSIS — Z309 Encounter for contraceptive management, unspecified: Secondary | ICD-10-CM

## 2012-05-29 LAB — POCT URINE PREGNANCY: Preg Test, Ur: NEGATIVE

## 2012-05-29 NOTE — Progress Notes (Signed)
Pt returned 2 weeks after last u preg and depo.  Repeat u-preg is negative.  Pt was informed.

## 2012-05-31 ENCOUNTER — Telehealth: Payer: Self-pay | Admitting: Family Medicine

## 2012-05-31 DIAGNOSIS — F1911 Other psychoactive substance abuse, in remission: Secondary | ICD-10-CM

## 2012-05-31 NOTE — Telephone Encounter (Signed)
Called patient for more information regarding substance abuse history for DMV form. Call completed. Patient still with pelvic pain as f/u appt with me tomorrow 3:15 PM.

## 2012-06-01 ENCOUNTER — Ambulatory Visit: Payer: Medicaid Other | Admitting: Family Medicine

## 2012-06-22 ENCOUNTER — Encounter (HOSPITAL_COMMUNITY): Payer: Self-pay

## 2012-06-22 ENCOUNTER — Emergency Department (HOSPITAL_COMMUNITY)
Admission: EM | Admit: 2012-06-22 | Discharge: 2012-06-23 | Disposition: A | Discharge status: Other Acute Inpatient Hospital | Payer: Medicaid Other | Attending: Emergency Medicine | Admitting: Emergency Medicine

## 2012-06-22 ENCOUNTER — Encounter: Payer: Self-pay | Admitting: Clinical

## 2012-06-22 ENCOUNTER — Telehealth: Payer: Self-pay | Admitting: Family Medicine

## 2012-06-22 DIAGNOSIS — R45851 Suicidal ideations: Secondary | ICD-10-CM | POA: Insufficient documentation

## 2012-06-22 DIAGNOSIS — F329 Major depressive disorder, single episode, unspecified: Secondary | ICD-10-CM | POA: Insufficient documentation

## 2012-06-22 DIAGNOSIS — F319 Bipolar disorder, unspecified: Secondary | ICD-10-CM

## 2012-06-22 DIAGNOSIS — F32A Depression, unspecified: Secondary | ICD-10-CM

## 2012-06-22 DIAGNOSIS — F17200 Nicotine dependence, unspecified, uncomplicated: Secondary | ICD-10-CM | POA: Insufficient documentation

## 2012-06-22 DIAGNOSIS — F3289 Other specified depressive episodes: Secondary | ICD-10-CM | POA: Insufficient documentation

## 2012-06-22 LAB — COMPREHENSIVE METABOLIC PANEL
Albumin: 5.2 g/dL (ref 3.5–5.2)
Alkaline Phosphatase: 60 U/L (ref 39–117)
BUN: 8 mg/dL (ref 6–23)
Potassium: 3 mEq/L — ABNORMAL LOW (ref 3.5–5.1)
Sodium: 138 mEq/L (ref 135–145)
Total Protein: 9.2 g/dL — ABNORMAL HIGH (ref 6.0–8.3)

## 2012-06-22 LAB — POCT PREGNANCY, URINE: Preg Test, Ur: NEGATIVE

## 2012-06-22 LAB — CBC
HCT: 41.9 % (ref 36.0–46.0)
MCHC: 35.3 g/dL (ref 30.0–36.0)
Platelets: 241 10*3/uL (ref 150–400)
RDW: 12.4 % (ref 11.5–15.5)

## 2012-06-22 LAB — RAPID URINE DRUG SCREEN, HOSP PERFORMED
Amphetamines: NOT DETECTED
Benzodiazepines: NOT DETECTED

## 2012-06-22 LAB — ETHANOL: Alcohol, Ethyl (B): <11 mg/dL (ref 0–11)

## 2012-06-22 MED ORDER — NICOTINE 21 MG/24HR TD PT24
21.0000 mg | MEDICATED_PATCH | Freq: Every day | TRANSDERMAL | Status: DC
  Administered 2012-06-22 – 2012-06-23 (×2): 21 mg via TRANSDERMAL
  Filled 2012-06-22 (×2): qty 1

## 2012-06-22 MED ORDER — ZOLPIDEM TARTRATE 5 MG PO TABS: 5.0000 mg | ORAL_TABLET | Freq: Every evening | ORAL | Status: DC | PRN

## 2012-06-22 MED ORDER — LORAZEPAM 1 MG PO TABS: 1.0000 mg | ORAL_TABLET | Freq: Three times a day (TID) | ORAL | Status: DC | PRN

## 2012-06-22 MED ORDER — ACETAMINOPHEN 325 MG PO TABS: 650.0000 mg | ORAL_TABLET | ORAL | Status: DC | PRN

## 2012-06-22 MED ORDER — POTASSIUM CHLORIDE CRYS ER 20 MEQ PO TBCR
40.0000 meq | EXTENDED_RELEASE_TABLET | Freq: Once | ORAL | Status: AC
  Administered 2012-06-22: 40 meq via ORAL
  Filled 2012-06-22: qty 2

## 2012-06-22 MED ORDER — ONDANSETRON HCL 8 MG PO TABS: 4.0000 mg | ORAL_TABLET | Freq: Three times a day (TID) | ORAL | Status: DC | PRN
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
## 2012-06-22 NOTE — ED Notes (Signed)
Pt states she just started on lithium but has not been taking it regularly. Pt states that she has been feeling as if she wants to hurt herself, states she has no specific plan in place. Pt denies any substance abuse.
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
## 2012-06-22 NOTE — ED Notes (Signed)
Pt changed to scrubs belongings taken child w/ nurse misty security called wanded and sitter called for

## 2012-06-22 NOTE — ED Notes (Signed)
Pt requesting her nicotine patch at this time.

## 2012-06-22 NOTE — ED Notes (Signed)
Ran out of bipolar meds in July just got on new med lithium 7/28 states it is daily struggle to not hurt self

## 2012-06-22 NOTE — ED Notes (Signed)
Vital signs stable. 

## 2012-06-22 NOTE — ED Notes (Signed)
CSW notified by FM CSW and Clinical Social Work Chiropodist of Pt's arrival to ED with 10mos old child. Pt was told that she could keep the child with her until her family arrived. CSW discussed concerns with staff and safe plan was agreed upon that enabled child to stay with Pt until family arrives.  Pt contacted her mother Janice @[phone] and she is expected to arrive by 1430.  Pt visibly relaxed upon arrival of her son to her room.  Tech remains in the room with Pt and baby.   No further CSW needs. Pt awaiting ACT assessment.   Frederico Hamman, LCSW [phone]
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
## 2012-06-22 NOTE — ED Notes (Signed)
Patient continues to await disposition. Alert and very responsive with sitter at bedside.
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
## 2012-06-23 ENCOUNTER — Inpatient Hospital Stay (HOSPITAL_COMMUNITY)
Admission: EM | Admit: 2012-06-23 | Discharge: 2012-06-27 | DRG: 885 | Disposition: A | Discharge status: Home / Self Care | Payer: Medicaid Other | Source: Ambulatory Visit | Attending: Psychiatry | Admitting: Psychiatry

## 2012-06-23 ENCOUNTER — Encounter (HOSPITAL_COMMUNITY): Payer: Self-pay | Admitting: *Deleted

## 2012-06-23 DIAGNOSIS — F1221 Cannabis dependence, in remission: Secondary | ICD-10-CM | POA: Diagnosis present

## 2012-06-23 DIAGNOSIS — F1994 Other psychoactive substance use, unspecified with psychoactive substance-induced mood disorder: Secondary | ICD-10-CM | POA: Diagnosis present

## 2012-06-23 DIAGNOSIS — R45851 Suicidal ideations: Secondary | ICD-10-CM

## 2012-06-23 DIAGNOSIS — F1290 Cannabis use, unspecified, uncomplicated: Secondary | ICD-10-CM | POA: Diagnosis present

## 2012-06-23 DIAGNOSIS — F1210 Cannabis abuse, uncomplicated: Secondary | ICD-10-CM | POA: Diagnosis present

## 2012-06-23 DIAGNOSIS — Z720 Tobacco use: Secondary | ICD-10-CM

## 2012-06-23 DIAGNOSIS — F319 Bipolar disorder, unspecified: Secondary | ICD-10-CM

## 2012-06-23 DIAGNOSIS — IMO0002 Reserved for concepts with insufficient information to code with codable children: Secondary | ICD-10-CM

## 2012-06-23 DIAGNOSIS — F39 Unspecified mood [affective] disorder: Secondary | ICD-10-CM | POA: Diagnosis present

## 2012-06-23 DIAGNOSIS — B009 Herpesviral infection, unspecified: Secondary | ICD-10-CM | POA: Diagnosis present

## 2012-06-23 DIAGNOSIS — F3130 Bipolar disorder, current episode depressed, mild or moderate severity, unspecified: Principal | ICD-10-CM | POA: Diagnosis present

## 2012-06-23 LAB — POTASSIUM: Potassium: 4 mEq/L (ref 3.5–5.1)

## 2012-06-23 MED ORDER — LITHIUM CARBONATE 300 MG PO CAPS
300.0000 mg | ORAL_CAPSULE | Freq: Two times a day (BID) | ORAL | Status: DC
  Administered 2012-06-24 – 2012-06-26 (×6): 300 mg via ORAL
  Filled 2012-06-23 (×7): qty 1

## 2012-06-23 MED ORDER — LITHIUM CARBONATE 300 MG PO CAPS
300.0000 mg | ORAL_CAPSULE | Freq: Two times a day (BID) | ORAL | Status: DC
  Administered 2012-06-23: 300 mg via ORAL
  Filled 2012-06-23 (×4): qty 1

## 2012-06-23 MED ORDER — ACETAMINOPHEN 325 MG PO TABS: 650.0000 mg | ORAL_TABLET | Freq: Four times a day (QID) | ORAL | Status: DC | PRN

## 2012-06-23 MED ORDER — ALUM & MAG HYDROXIDE-SIMETH 200-200-20 MG/5ML PO SUSP: 30.0000 mL | ORAL | Status: DC | PRN

## 2012-06-23 MED ORDER — POTASSIUM CHLORIDE CRYS ER 20 MEQ PO TBCR
20.0000 meq | EXTENDED_RELEASE_TABLET | Freq: Once | ORAL | Status: AC
  Administered 2012-06-23: 20 meq via ORAL
  Filled 2012-06-23 (×2): qty 1

## 2012-06-23 MED ORDER — MAGNESIUM HYDROXIDE 400 MG/5ML PO SUSP
30.0000 mL | Freq: Every day | ORAL | Status: DC | PRN
  Administered 2012-06-24: 30 mL via ORAL

## 2012-06-23 MED ORDER — ZOLPIDEM TARTRATE 5 MG PO TABS
5.0000 mg | ORAL_TABLET | Freq: Every evening | ORAL | Status: DC | PRN
  Administered 2012-06-23 – 2012-06-25 (×3): 5 mg via ORAL
  Filled 2012-06-23 (×4): qty 1

## 2012-06-23 NOTE — Progress Notes (Signed)
Psychoeducational Group Note  Date:  06/23/2012 Time:  2000  Group Topic/Focus:  Wrap-Up Group:   The focus of this group is to help patients review their daily goal of treatment and discuss progress on daily workbooks.  Participation Level: Did Not Attend  Participation Quality:  Not Applicable  Affect:  Not Applicable  Cognitive:  Not Applicable  Insight:  Not Applicable  Engagement in Group: Not Applicable  Additional Comments:  The patient was admitted to the hallway after the group concluded.   [name] 06/23/2012, 10:54 PM

## 2012-06-23 NOTE — ED Notes (Signed)
Spoke with Dr. Rubin Payor regarding restarting patient's Lithium. Physician agreed to reorder.
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
## 2012-06-23 NOTE — BH Assessment (Signed)
BHH Assessment Progress Note      Pt has been accepted as of 1730 to [facility] to Dr. Dan Humphreys.  All support paperwork completed.  Dr and nursing staff notified and agreeable with transfer.

## 2012-06-23 NOTE — Tx Team (Signed)
Initial Interdisciplinary Treatment Plan  PATIENT STRENGTHS: (choose at least two) Ability for insight Average or above average intelligence Capable of independent living Communication skills General fund of knowledge Motivation for treatment/growth Physical Health Supportive family/friends  PATIENT STRESSORS: Legal issue Substance abuse   PROBLEM LIST: Problem List/Patient Goals Date to be addressed Date deferred Reason deferred Estimated date of resolution  depression 06/23/2012     Suicidal ideation 06/23/2012                                                DISCHARGE CRITERIA:  Ability to meet basic life and health needs Improved stabilization in mood, thinking, and/or behavior Motivation to continue treatment in a less acute level of care Need for constant or close observation no longer present Reduction of life-threatening or endangering symptoms to within safe limits Verbal commitment to aftercare and medication compliance  PRELIMINARY DISCHARGE PLAN: Attend aftercare/continuing care group Participate in family therapy Return to previous living arrangement  PATIENT/FAMIILY INVOLVEMENT: This treatment plan has been presented to and reviewed with the patient, Tiffany Wong.  The patient and family have been given the opportunity to ask questions and make suggestions.  [name] 06/23/2012, 9:09 PM
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
## 2012-06-23 NOTE — ED Notes (Signed)
Attempted to call report to [facility]. Awaiting return phone call at this time from RN at facility.

## 2012-06-24 DIAGNOSIS — IMO0002 Reserved for concepts with insufficient information to code with codable children: Secondary | ICD-10-CM

## 2012-06-24 DIAGNOSIS — F1210 Cannabis abuse, uncomplicated: Secondary | ICD-10-CM | POA: Diagnosis present

## 2012-06-24 DIAGNOSIS — R45851 Suicidal ideations: Secondary | ICD-10-CM

## 2012-06-24 DIAGNOSIS — F39 Unspecified mood [affective] disorder: Secondary | ICD-10-CM | POA: Diagnosis present

## 2012-06-24 DIAGNOSIS — F1221 Cannabis dependence, in remission: Secondary | ICD-10-CM | POA: Diagnosis present

## 2012-06-24 DIAGNOSIS — F3130 Bipolar disorder, current episode depressed, mild or moderate severity, unspecified: Principal | ICD-10-CM

## 2012-06-24 DIAGNOSIS — F1290 Cannabis use, unspecified, uncomplicated: Secondary | ICD-10-CM | POA: Diagnosis present

## 2012-06-24 HISTORY — DX: Reserved for concepts with insufficient information to code with codable children: IMO0002

## 2012-06-24 LAB — COMPREHENSIVE METABOLIC PANEL
ALT: 13 U/L (ref 0–35)
Calcium: 9.3 mg/dL (ref 8.4–10.5)
GFR calc Af Amer: >90 mL/min (ref >90)
Glucose, Bld: 116 mg/dL — ABNORMAL HIGH (ref 70–99)
Sodium: 136 mEq/L (ref 135–145)
Total Protein: 7.8 g/dL (ref 6.0–8.3)

## 2012-06-24 MED ORDER — NICOTINE 21 MG/24HR TD PT24
MEDICATED_PATCH | TRANSDERMAL | Status: AC
  Administered 2012-06-24: 08:00:00
  Filled 2012-06-24: qty 1

## 2012-06-24 MED ORDER — LITHIUM CARBONATE 300 MG PO CAPS
300.0000 mg | ORAL_CAPSULE | Freq: Two times a day (BID) | ORAL | Status: DC
  Administered 2012-06-24 – 2012-06-27 (×3): 300 mg via ORAL
  Filled 2012-06-24 (×5): qty 1
  Filled 2012-06-24: qty 20
  Filled 2012-06-24: qty 1
  Filled 2012-06-24 (×3): qty 20
  Filled 2012-06-24 (×2): qty 1

## 2012-06-24 NOTE — Progress Notes (Signed)
[facility] Group Notes:  (Counselor/Nursing/MHT/Case Management/Adjunct)  06/24/2012 2:50 PM  Type of Therapy:  After care Planning Group  Pt. Participated in After Care Planning group and was given [facility] SI pamphlet and crisis hotline numbers and agreed to use them if needed.  Pt. Spoke about  Being overwhelmed and under stress as the reason she had to come to [facility]. Pt. shared her Bipolar diagnosis with group and states she was not taking her medication as se should have.   Lamar Blinks Jeneen 06/24/2012, 2:50 PM
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
## 2012-06-24 NOTE — H&P (Signed)
  Pt was seen by me today and I agree with the key elements documented in H&P.  

## 2012-06-24 NOTE — Progress Notes (Signed)
[facility] Group Notes:  (Counselor/Nursing/MHT/Case Management/Adjunct)  06/24/2012 10:20 PM  Type of Therapy:  Psychoeducational Skills  Participation Level:  Active  Participation Quality:  Attentive  Affect:  Anxious  Cognitive:  Appropriate  Insight:  Good  Engagement in Group:  Good  Engagement in Therapy:  Good  Modes of Intervention:  Education  Summary of Progress/Problems: The patient stated that she had a good day. For one, she states that she learned a few things from her groups. She spent more time getting to know her peers and that helped her with the day. Her goal for tomorrow is to get herself "more organized" and to continue to find additional "coping skills".    Elyas Villamor S 06/24/2012, 10:20 PM
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
## 2012-06-24 NOTE — Progress Notes (Signed)
D) Pt attending the groups and interacting with her peers. Tends to withdraw when it comes to emotional issues. Did Rate her depression and hopelessness both at a 3. Denies SI and HI. Pt wrote that she is going to try and not take on other peoples problems and concentrate on her own stability and relax. States that she have been on her own for a long time and is very independent. That it is hard for her to ask for help or to ask others for things.  A) Given support and reassurance. Encouraged to talk and share her feelings. Given positive feedback and reassurance. R). Pt visiting with her family Mother and father this evening. Visit is appropriate.

## 2012-06-24 NOTE — Progress Notes (Signed)
[facility] Group Notes:  (Counselor/Nursing/MHT/Case Management/Adjunct)  06/24/2012 3:53 PM  Type of Therapy:  Group Therapy  Participation Level:  Active  Participation Quality:  Appropriate and Attentive  Affect:  Appropriate  Cognitive:  Appropriate  Insight:  Good  Engagement in Group:  Good  Engagement in Therapy:  Good  Modes of Intervention:  Clarification, Education, Socialization and Support  Summary of Progress/Problems: Pt. participated in group discussion on  Self sabotaging behaviors and how to change the way they think in order to make necessary changes in their live in positive ways. Each pt. Shared their self sabotaging behavior and what they can  Do to positively enable themselves. Each pt. Shared what self sabotaging meant to them. Pt. Spoke about  Comparing herself physical;lly and financially to others and discussed er self esteem issue with the group. The pt.spoke about  Issues and became to cry. Pt. Was encouraged by therapist and group members to speak positive affirmations and to be more positive about her best quanties. Pt. Shared her list  Of good characteristics of self with the group.  Neila Gear 06/24/2012, 3:53 PM

## 2012-06-24 NOTE — Progress Notes (Signed)
Psychoeducational Group Note  Date: 06/24/2012 Time: 1015  Group Topic/Focus:  Identifying Needs:   The focus of this group is to help patients identify their personal needs that have been historically problematic and identify healthy behaviors to address their needs.  Participation Level:  Active  Participation Quality:  Appropriate  Affect:  Anxious  Cognitive:  Alert  Insight:  Good  Engagement in Group:  Good  Additional Comments:    9/7/20134:16 PM Axavier Pressley, Joie Bimler

## 2012-06-25 DIAGNOSIS — F3110 Bipolar disorder, current episode manic without psychotic features, unspecified: Secondary | ICD-10-CM

## 2012-06-25 MED ORDER — RISPERIDONE 0.5 MG PO TABS
0.5000 mg | ORAL_TABLET | Freq: Every day | ORAL | Status: DC
  Administered 2012-06-25: 0.5 mg via ORAL
  Filled 2012-06-25 (×4): qty 1

## 2012-06-25 MED ORDER — NICOTINE 21 MG/24HR TD PT24
MEDICATED_PATCH | TRANSDERMAL | Status: AC
  Filled 2012-06-25: qty 1
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
## 2012-06-25 NOTE — Progress Notes (Signed)
Patient ID: Tiffany Wong, female   DOB: Mar 30, 1991, 21 y.o.   MRN: [id number] D. The patient is pleasant and appropriately interacted in the milieu. She engaged easily in conversation. Denied any suicidal ideation. Attended and participated in evening wrap up group. A. Met with patient 1:1 to assess and review medication. Administered medication for insomnia. Q 15 minute checks maintained for safety. R. The patient is knowledgeable of her medication, dose and times. Remains safe on unit.

## 2012-06-25 NOTE — Progress Notes (Signed)
D Mirabella  Cont to grow, to learn and to understand her issues every day. SHe is attentive in group. She asks appropriate questions. She processes her illness and is actively trying to develop healtheir coping skills. She is compliant with her meds as well as her other therapies. She is articulate. She expresses her feelings well. She makes good eye contact .  A She attends her groups. She completed her sefl inventory and on it she wrote she denied SI, she rated her depression and hopelessness " 3/ 2" and stated her DC plan was ot : ' express how I am feelings to others before it becomes overwhelming and turns into a major problem".  R Safety is in place and POC includes continuing to foster therapeutic realationship PD RN [facility]

## 2012-06-25 NOTE — Progress Notes (Signed)
Psychoeducational Group Note  Date:  06/25/2012 Time: 1015 Group Topic/Focus:  Making Healthy Choices:   The focus of this group is to help patients identify negative/unhealthy choices they were using prior to admission and identify positive/healthier coping strategies to replace them upon discharge.  Participation Level:  Active  Participation Quality:  Appropriate  Affect:  Appropriate  Cognitive:  Alert  Insight:  Good  Engagement in Group:  Good  Additional Comments:    Rich Brave 1:34 PM. 06/25/2012
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
## 2012-06-25 NOTE — Progress Notes (Signed)
[facility] Group Notes:  (Counselor/Nursing/MHT/Case Management/Adjunct)  06/25/2012 5:02 PM  Type of Therapy:  Group Therapy  Participation Level:  Active  Participation Quality:  Appropriate and Attentive  Affect:  Appropriate  Cognitive:  Appropriate  Insight:  Good  Engagement in Group:  Good  Engagement in Therapy:  Good  Modes of Intervention:  Clarification, Education, Socialization and Support  Summary of Progress/Problems: Pt. participated in group on health support systems and was asked who they have in their lives that are a support and how to find supports when their supports are not able to be there. The pt. Spoke about what support means to them and the difference between healthy and unhealthy supports. Each pt. participated in a support activity at the end of the group. Pt. Names her aunt, friends and mother as support systems. Pt. Also named her care providers such as doctors and therapist who are a large part of her support system also.  Lamar Blinks [place] 06/25/2012, 5:02 PM

## 2012-06-26 DIAGNOSIS — F1994 Other psychoactive substance use, unspecified with psychoactive substance-induced mood disorder: Secondary | ICD-10-CM

## 2012-06-26 MED ORDER — RISPERIDONE 0.25 MG PO TABS: 0.2500 mg | ORAL_TABLET | Freq: Every day | ORAL | Status: DC

## 2012-06-26 MED ORDER — NICOTINE 21 MG/24HR TD PT24: 1.0000 | MEDICATED_PATCH | Freq: Every day | TRANSDERMAL | Status: DC

## 2012-06-26 MED ORDER — TRAZODONE HCL 50 MG PO TABS
50.0000 mg | ORAL_TABLET | Freq: Every evening | ORAL | Status: DC | PRN
  Administered 2012-06-26: 50 mg via ORAL
  Filled 2012-06-26: qty 1

## 2012-06-26 MED ORDER — RISPERIDONE 0.25 MG PO TABS
0.2500 mg | ORAL_TABLET | Freq: Every day | ORAL | Status: DC
  Administered 2012-06-26: 0.25 mg via ORAL
  Filled 2012-06-26: qty 1
  Filled 2012-06-26: qty 10
  Filled 2012-06-26: qty 1
  Filled 2012-06-26: qty 10

## 2012-06-26 MED ORDER — NICOTINE 21 MG/24HR TD PT24
21.0000 mg | MEDICATED_PATCH | Freq: Every day | TRANSDERMAL | Status: DC
  Administered 2012-06-25: 08:00:00 via TRANSDERMAL
  Administered 2012-06-26 – 2012-06-27 (×2): 21 mg via TRANSDERMAL
  Filled 2012-06-26 (×4): qty 1
  Filled 2012-06-26 (×2): qty 14

## 2012-06-26 MED ORDER — LITHIUM CARBONATE 300 MG PO CAPS: 300.0000 mg | ORAL_CAPSULE | Freq: Two times a day (BID) | ORAL | Status: DC

## 2012-06-26 MED ORDER — MAGNESIUM CITRATE PO SOLN
148.0000 mL | Freq: Once | ORAL | Status: AC
  Administered 2012-06-26: 148 mL via ORAL

## 2012-06-26 NOTE — Progress Notes (Addendum)
D:  Patient's self inventory sheet, patient sleeps well, has good appetite, hyper energy, good attention span.  Rated depression and hopelessness #1.  Denied withdrawals.   Denied SI.   Denied physical problem in past 24 hours.  No pain goal.  No worst pain.  After discharge, patient will "continue practicing coping skills learned in groups".  No questions for staff.  No problems taking meds after discharge. A:  Medications per MD order.  Support and encouragement given throughout day.  Safety checks as ordered. R:  Following treatment plan.   Denies SI & HI.   Denied A/V hallucinations.  Patient remains safe and receptive on unit.  Patient will talk to MD about medication for constipation.  No results with MOM.   Sticky note to MD sent. 1800  Patient came back from dinner with her son's father.  Patient upset because people are trying to tell her what to do.   Stated she will not hurt her young son, that she loves her son.   Will discuss feelings with MD/CM.

## 2012-06-26 NOTE — Progress Notes (Signed)
D: Patient denies SI/HI/AVH. Patient rates hopelessness as 0,  depression as 1 , and anxiety as 8.  Patient affect is anxious. Mood is depressed. Patient states that she feels positive about this admission because she has been given good coping skills that are realistic this time. She states that this is the first time that she has been able to figure out why she has her breakdowns, which has been helpful. She is also confident about her medication treatment this time. She feels that it will work well for her.  Patient did attend evening group. Patient visible on the milieu. No distress noted. A: Support and encouragement offered. Scheduled medications given to pt. Q 15 min checks continued for patient safety. R: Patient receptive. Patient remains safe on the unit.

## 2012-06-26 NOTE — Tx Team (Signed)
Interdisciplinary Treatment Plan Update (Adult)  Date:  06/26/2012  Time Reviewed:  10:54 AM   Progress in Treatment: Attending groups: Yes Participating in groups:  Yes Taking medication as prescribed: Yes Tolerating medication:  Yes Family/Significant other contact made:  Counselor will assess for appropriate contact.   Patient understands diagnosis:  Yes Discussing patient identified problems/goals with staff:  Yes Medical problems stabilized or resolved:  Yes Denies suicidal/homicidal ideation: Yes Issues/concerns per patient self-inventory:  None identified Other: N/A  New problem(s) identified: None Identified  Reason for Continuation of Hospitalization: Anxiety Depression Medication stabilization  Interventions implemented related to continuation of hospitalization: mood stabilization, medication monitoring and adjustment, group therapy and psycho education, safety checks q 15 mins  Additional comments: N/A  Estimated length of stay: 3-5 days  Discharge Plan: SW will assess for appropriate referrals.    New goal(s): N/A  Review of initial/current patient goals per problem list:    1.  Goal(s): Reduce depressive symptoms  Met:  No  Target date: by discharge  As evidenced by: Reducing depression from a 10 to a 3 as reported by pt.   2.  Goal (s): Reduce/Eliminate suicidal ideation  Met:  No  Target date: by discharge  As evidenced by: pt reporting no SI.    3.  Goal(s): Reduce anxiety symptoms  Met:  No  Target date: by discharge  As evidenced by: Reduce anxiety from a 10 to a 3 as reported by pt.    Attendees: Patient:  Tiffany Wong 06/26/2012 10:54 AM   Family:     Physician:  Orson Aloe, MD  06/26/2012  10:54 AM   Nursing:   Rodman Key, RN 06/26/2012 10:54 AM   Case Manager:  Reyes Ivan, LCSWA 06/26/2012  10:54 AM   Counselor:  Angus Palms, LCSW 06/26/2012  10:54 AM   Other:  Juline Patch, LCSW 06/26/2012  10:54 AM   Other:  Quintella Reichert, RN 06/26/2012 10:54 AM   Other:     Other:      Scribe for Treatment Team:   Carmina Miller, 06/26/2012 , 10:54 AM

## 2012-06-26 NOTE — Progress Notes (Signed)
Pt is at the med window for hs meds.  Pt reports she is going to be discharged home tomorrow.  She is feeling overwhelmed over the prospect of going home.  She says she sees a Paramedic at [facility] of the [place] and plans to continue there.  Assured pt that case management would have her follow-up appts scheduled before she leaves.  She says she is determined to stay on her meds.  She denies SI/HI/AV.  She has c/o constipation today and was given 1/2 bottle Mag Citrate tonight.  Will monitor for results.  Pt makes her needs known to staff.  Safety maintained with q15 minute checks.

## 2012-06-26 NOTE — Progress Notes (Signed)
[facility] Group Notes: (Counselor/Nursing/MHT/Case Management/Adjunct) 06/26/2012   @  1:15-2:30pm Overcoming Obstacles to Wellness   Type of Therapy:  Group Therapy  Participation Level:  Active  Participation Quality: Appropriate, Sharing, Supportive, Challenging   Affect:  Appropriate  Cognitive:  Appropriate  Insight:  Good  Engagement in Group: Good  Engagement in Therapy:  Good  Modes of Intervention:  Support and Exploration  Summary of Progress/Problems: Andreia was very engaged and insightful. She talked about ways that she has been an obstacle to herself in the past, both by putting herself down and by allowing negative people to have power over her. She processed her experience of setting boundaries in which she did not allow things that certain people say to effect her, because they are trying to manipulate her to get their needs met. Marquerite was very supportive of others' recovery, and talked about making difficult decisions rather than throwing up one's hands and allowing things just to happen. She also confronted another group member who was blaming others for her situation, and challenged her to look at what she does have control over and what she did to allow the current crisis to take place. Shantrell stated that sometimes one has to act even when they don't feel completely able - such as when she became a mother before she wanted to but stepped up to the plate because it was the right thing to do.  Angus Palms, LCSW 06/26/2012  2:48 PM

## 2012-06-26 NOTE — Progress Notes (Signed)
Pt attended discharge planning group and actively participated in group.  SW provided pt with today's workbook.  Pt presents with calm mood and affect.  Pt rates depression at a 0 and anxiety at a 7 today.  Pt denies SI/HI today.  Pt was open with sharing reason for entering the hospital.  Pt states that she was off her medication for a month and started feeling overwhelmed.  Pt states that she decided she needed help and came here.  Pt states that she left her 27 month old son in [place] with her mother so she could come into the hospital.  Pt states that her mom is very helpful and supportive.  Pt states that her child's father is here in [place] and is starting to be more understandable of her mental illness.  Pt states that she lives in [place] and uses the bus system for transportation.  Pt states that she goes to [facility] the [place] for medication management and therapy.  SW will refer pt back there.  No further needs voiced by pt at this time.  Safety planning and suicide prevention discussed.  Pt participated in discussion and acknowledged an understanding of the information provided.       Reyes Ivan, LCSWA 06/26/2012  10:21 AM

## 2012-06-26 NOTE — Progress Notes (Signed)
[facility] Group Notes:  (Counselor/Nursing/MHT/Case Management/Adjunct)  06/26/2012 1:50 PM  Type of Therapy:  Psychoeducational Skills  Participation Level:  Active  Participation Quality:  Appropriate  Affect:  Appropriate  Cognitive:  Appropriate  Insight:  Good  Engagement in Group:  Good  Engagement in Therapy:  Good  Modes of Intervention:  Education  Summary of Progress/Problems:Patient were instructed to define what self care means to them. Patient was instructed to identify three areas where they do not take care of themselves, and to focus on their strengths while doing so.  Patient was encouraged to discuss barriers to why they have not incorporated self care into their lives in the past. Staff instructed the patient to identify one area of their life that they will start to work on. Staff concluded the group by reviewing why it is important to take care of self.  Patient was able to openly discuss why she has failed to to take care of her self in the past and stated that she is going to focus on taking it one day at a time in order to embrace the concept of self care. Patient stated that she has to learn how to focus on her instead of everyone around her in order to be successful.    Ardelle Park O 06/26/2012, 1:50 PM
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
## 2012-06-26 NOTE — Progress Notes (Signed)
[facility] Group Notes:  (Counselor/Nursing/MHT/Case Management/Adjunct)  06/26/2012 12:09 AM  Type of Therapy:  Psychoeducational Skills  Participation Level:  Active  Participation Quality:  Appropriate  Affect:  Appropriate  Cognitive:  Appropriate  Insight:  Good  Engagement in Group:  Good  Engagement in Therapy:  Good  Modes of Intervention:  Education  Summary of Progress/Problems: The pt.states that she had a great day. She pointed out that at one point she came close to losing her temper, but she talked herself through the situation. Her goal for tomorrow is to continue working on developing her coping skills and putting them to good use. She recognizes that she will be facing some stressors upon discharge and that she must use the skills that she learned in the hospital .    Derrill Kay, Sharlet Salina S 06/26/2012, 12:09 AM

## 2012-06-26 NOTE — Progress Notes (Signed)
Psychoeducational Group Note  Date:  06/26/2012 Time:  2000  Group Topic/Focus:  Wrap-Up Group:   The focus of this group is to help patients review their daily goal of treatment and discuss progress on daily workbooks.  Participation Level:  Active  Participation Quality:  Patient did attend AA meeting tonight  Affect:    Cognitive:    Insight:    Engagement in Group:    Additional Comments:  Patient did attend AA meeting tonight.  Julianah Marciel, Newton Pigg 06/26/2012, 9:43 PM

## 2012-06-26 NOTE — Progress Notes (Signed)
Pt attended AA group on [address] tonight.

## 2012-06-27 LAB — LITHIUM LEVEL: Lithium Lvl: 0.37 mEq/L — ABNORMAL LOW (ref 0.80–1.40)

## 2012-06-27 MED ORDER — TRAZODONE HCL 50 MG PO TABS: 50.0000 mg | ORAL_TABLET | Freq: Every evening | ORAL | Status: DC | PRN

## 2012-06-27 MED ORDER — TRAZODONE HCL 50 MG PO TABS
50.0000 mg | ORAL_TABLET | Freq: Every evening | ORAL | Status: DC | PRN
  Filled 2012-06-27: qty 10

## 2012-06-27 NOTE — Progress Notes (Signed)
[facility] Group Notes: (Counselor/Nursing/MHT/Case Management/Adjunct) 06/27/2012   @  1:15PM  Finding Balance in Life  Type of Therapy:  Group Therapy  Participation Level:  Active  Participation Quality: Appropriate, Sharing, Supportive    Affect:  Appropriate  Cognitive:  Appropriate  Insight:  Good  Engagement in Group: Good  Engagement in Therapy:  Good  Modes of Intervention:  Support and Exploration  Summary of Progress/Problems: Mirriam came to group late, but was very engaged. She explored ways that she has found balance in relationships, stating that she has learned she has to accept people for who they are rather than who she wants them to be. She shared about her relationship with her mother as an example of this. Sumaiya also processed the problems she had in the relationship with her son's father, and that they came from the choices she made when they first got involved. She was supportive of other group members, encouraging them to live in the present, using past experience as teachers rather than baggage. She emphasized how easily relationships with anyone can get out of balance when one is more dedicated to the other, and encouraged others to take time for themselves when this happened, so that they are not vulnerable to rebound relationships and further hurt, but also do not allow the current pain to taint future relationships. She expressed her appreciation of the mindfulness and wise mind model, stating it really helped her to understand living for today rather than in yesterday or pining for the future.   Billie Lade 06/27/2012   2:36 PM
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
## 2012-06-27 NOTE — Tx Team (Signed)
Interdisciplinary Treatment Plan Update (Adult)  Date:  06/27/2012  Time Reviewed:  9:57 AM   Progress in Treatment: Attending groups: Yes Participating in groups:  Yes Taking medication as prescribed: Yes Tolerating medication:  Yes Family/Significant other contact made: Yes Patient understands diagnosis:  Yes Discussing patient identified problems/goals with staff:  Yes Medical problems stabilized or resolved:  Yes Denies suicidal/homicidal ideation: Yes Issues/concerns per patient self-inventory:  None identified Other: N/A  New problem(s) identified: None Identified  Reason for Continuation of Hospitalization: Stable to d/c  Interventions implemented related to continuation of hospitalization: Stable to d/c  Additional comments: N/A  Estimated length of stay: D/C today  Discharge Plan: Pt will follow up with [facility] for medication management and therapy.    New goal(s): N/A  Review of initial/current patient goals per problem list:    1.  Goal(s): Reduce depressive symptoms  Met:  Yes  Target date: by discharge  As evidenced by: Reducing depression from a 10 to a 3 as reported by pt.  Pt rates at a 0 today.   2.  Goal (s): Reduce/Eliminate suicidal ideation  Met:  Yes  Target date: by discharge  As evidenced by: pt reporting no SI.    3.  Goal(s): Reduce anxiety symptoms  Met:  Yes  Target date: by discharge  As evidenced by: Reduce anxiety from a 10 to a 3 as reported by pt. Pt rates at a 5 today but reports feeling stable to d/c.     Attendees: Patient:  Tiffany Wong  06/27/2012 9:57 AM   Family:     Physician:  Orson Aloe, MD 06/27/2012 9:57 AM   Nursing:  Quintella Reichert, RN 06/27/2012 9:57 AM   Case Manager:  Reyes Ivan, LCSWA 06/27/2012 9:57 AM   Counselor:  Angus Palms, LCSW 06/27/2012 9:57 AM   Other:  Juline Patch, LCSW 06/27/2012 9:57 AM   Other: Rosemarie Beath, RN 06/27/2012 9:57AM  Other:     Other:      Scribe for  Treatment Team:   Carmina Miller, 06/27/2012 9:57 AM
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
## 2012-06-27 NOTE — Progress Notes (Signed)
D:  Patient's self inventory sheet, patient sleeps well, good appetite, normal energy level, good attention span.   Rated depression and hopelessness #1.  Denied withdrawals.  Denied SI.  Denied physical problems.   Knows that she needs to "express my feelings in a healthy way before I feel backed into a corner.   And work on not neglecting myself to help other people.  During my stay here, everyone has been kind and helpful.  Thanks for caring.   And if you didn't care; thanks for not pretending."  Does have discharge plans.  No problems with meds after discharge. A:   Treatment program continues.   Medications given as ordered by MD. R:  Encouragement and support given to patient.  Patient remains safe on unit.

## 2012-06-27 NOTE — Progress Notes (Signed)
[facility] Case Management Discharge Plan:  Will you be returning to the same living situation after discharge: Yes,  returning to own home At discharge, do you have transportation home?:Yes,  provided pt with a bus pass Do you have the ability to pay for your medications:Yes,  access to meds   Release of information consent forms completed and in the chart;  Patient's signature needed at discharge.  Patient to Follow up at:  Follow-up Information    Follow up with [facility] the [place] on 07/07/2012. (Appointment scheduled at 11:00 am with Lawanna Kobus)    Contact information:   [address] [place], [place] [address] [phone]      Follow up with [facility] the [place] on 07/12/2012. (Appointment scheduled at 8:30 am with Vear Clock, NP)    Contact information:   [address], [place] [address] [phone]         Patient denies SI/HI:   Yes,  denies SI/HI    Safety Planning and Suicide Prevention discussed:  Yes,  discussed with pt today  Barrier to discharge identified:No.  Summary and Recommendations: Pt attended discharge planning group and actively participated in group.  SW provided pt with today's workbook.  Pt presents with calm mood and affect.  Pt rates depression at a 0 and anxiety at a 5 today.  Pt denies SI/HI.  Pt reports feeling stable to d/c today.  No recommendations from SW.  No further needs voiced by pt.  Pt stable to discharge.     Carmina Miller 06/27/2012, 9:25 AM

## 2012-06-27 NOTE — Progress Notes (Signed)
Discharge Note:   Patient will take bus to her home.    Patient has received all her medications, prescription, discharge instructions, miscellaneous clothing items.  Denied SI and HI.   Denied A/V hallucinations.   Denied pain.   Patient stated she appreciates all the staff has done to assist her while at [facility].   Patient has been cooperative and pleasant.  Patient stated she does not have access to any firearms after discharge.

## 2012-06-28 NOTE — Progress Notes (Signed)
Patient Discharge Instructions:  After Visit Summary (AVS):   Faxed to:  06/28/2012 Psychiatric Admission Assessment Note:   Faxed to:  06/28/2012 Suicide Risk Assessment - Discharge Assessment:   Faxed to:  06/28/2012 Faxed/Sent to the Next Level Care provider:  06/28/2012  Faxed to [facility] [place] @ [phone]  Wandra Scot, 06/28/2012, 7:40 PM

## 2012-06-29 ENCOUNTER — Telehealth: Payer: Self-pay | Admitting: Clinical

## 2012-06-29 NOTE — Telephone Encounter (Signed)
Clinical Child psychotherapist (CSW) received a call from pt yesterday afternoon to inform CSW that she had dc'ed from inpatient treatment at behavioral health. CSW contacted pt this morning to explore how treatment went, what new skills pt has learned and how pt is feeling.  Pt stated she was very pleased with treatment. Pt stated she participated in 4 group meetings and learned more appropriate skills to manage her symptoms. Pt stated she learned how to identify her emotions and express/communicate them in an effective way. Pt denies having any active SI and reports she will take "each day 1 step at a time." CSW praised pt for participation in treatment, and her willingness and motivation to seek treatment.   CSW informed pt that CSW did submit a form to release information however CSW did not receive a call from pt therapist.Pt states she has an appt with her therapist on July 07, 2012 and will then update her therapist on pt progress. Pt reports she has contacted her child's school, he will return home with her today and begin his 1st day of school tomorrow. Pt reports looking forward to seeing her son.  CSW encouraged pt to contact CSW in the future for any CSW needs. Pt agreeable and very appreciative.  Theresia Bough, MSW, Theresia Majors [phone]

## 2012-07-04 ENCOUNTER — Ambulatory Visit (INDEPENDENT_AMBULATORY_CARE_PROVIDER_SITE_OTHER): Payer: Medicaid Other | Admitting: Family Medicine

## 2012-07-04 ENCOUNTER — Encounter: Payer: Self-pay | Admitting: Family Medicine

## 2012-07-04 VITALS — BP 110/72 | HR 83 | Temp 98.0°F | Ht 61.0 in | Wt 103.0 lb

## 2012-07-04 DIAGNOSIS — N949 Unspecified condition associated with female genital organs and menstrual cycle: Secondary | ICD-10-CM

## 2012-07-04 DIAGNOSIS — Z720 Tobacco use: Secondary | ICD-10-CM

## 2012-07-04 DIAGNOSIS — R102 Pelvic and perineal pain: Secondary | ICD-10-CM

## 2012-07-04 DIAGNOSIS — F39 Unspecified mood [affective] disorder: Secondary | ICD-10-CM

## 2012-07-04 DIAGNOSIS — F319 Bipolar disorder, unspecified: Secondary | ICD-10-CM

## 2012-07-04 DIAGNOSIS — F17200 Nicotine dependence, unspecified, uncomplicated: Secondary | ICD-10-CM

## 2012-07-04 DIAGNOSIS — R45851 Suicidal ideations: Secondary | ICD-10-CM

## 2012-07-04 DIAGNOSIS — F1911 Other psychoactive substance abuse, in remission: Secondary | ICD-10-CM

## 2012-07-04 DIAGNOSIS — R21 Rash and other nonspecific skin eruption: Secondary | ICD-10-CM

## 2012-07-04 MED ORDER — CYCLOBENZAPRINE HCL 10 MG PO TABS: 10.0000 mg | ORAL_TABLET | Freq: Two times a day (BID) | ORAL | Status: DC | PRN

## 2012-07-04 MED ORDER — TRIAMCINOLONE ACETONIDE 0.1 % EX CREA: TOPICAL_CREAM | Freq: Two times a day (BID) | CUTANEOUS | Status: DC

## 2012-07-04 MED ORDER — MELOXICAM 15 MG PO TABS: 15.0000 mg | ORAL_TABLET | Freq: Every day | ORAL | Status: DC

## 2012-07-04 NOTE — Patient Instructions (Addendum)
Maple,  Thank you for coming in to see me today. Urine pregnancy test is negative.   1. Pelvic pain: -daily antiinflammatory -flexeril as needed -referral to gyn for possible endometriosis -please discuss this with your therapist. As I am concerned that your pain may also be related to depression.   2. Rash: most likely eczematous rash (dry and inflamed skin): use kenalog ointment twice daily for up to one week, take a break and use again as needed. If rash worsens stop use and call. Be mindful that overuse of kenalog can lighten skin.   3. Smoking cessation support: smoking cessation hotline: 1-800-QUIT-NOW.  Here is the number to the smoking cessation classes at [facility]: 9048119384  F/u with me in 6 weeks or sooner if needed.   Dr. Armen Pickup

## 2012-07-04 NOTE — Progress Notes (Signed)
Subjective:     Patient ID: Tiffany Wong, female   DOB: 07/27/1991, 21 y.o.   MRN: [id number]  HPI 21 yo F with history significant for bipolar depression presents for f/u visit to discuss the following:  #: Hospital follow up: hospitalized at [facility] behavioral health for suicidal ideation. Feels much better. Denies suicidal ideation. Compliant with medications. Has f/u with her therapist on 07/07/12.   #. Pelvic pain: chronic problem since the birth of her son 10 month ago. Pain is worsened with increase walking. She uses public transportation and averages walking 4 miles per day. She describes bilateral lower abdominal pain. She admits to spotting (on depo for contraception) that seems to worsen with prolonged walking and smoking. She admits to nausea but denies fever. She has a history of heavy menstrual cramps but her cramping improved on depo. Additionally she admits to pain after intercourse and with defecation. Her pain has been improved in the past with rest and flexeril.   #. Rash: x 2 weeks. Upper arms. First R then L. Raised papules, pruritic. itching improved with hydrocortisone cream. No one else at home with rash.   #. Smoking: not ready to quit. Not using patches. Smokes 6 cigarettes per day. Family urging her to quit.   Review of Systems As per HPI  MSK: pain in neck and back especially during times of tension/increased stress.     Objective:   Physical Exam BP 110/72  Pulse 83  Temp 98 F (36.7 C) (Oral)  Ht 5\' 1"  (1.549 m)  Wt 103 lb (46.72 kg)  BMI 19.46 kg/m2  LMP 07/04/2012 Wt Readings from Last 3 Encounters:  07/04/12 103 lb (46.72 kg)  01/26/12 96 lb (43.545 kg)  12/31/11 100 lb (45.36 kg)  General appearance: alert, cooperative and no distress Abdomen: soft, non-tender; bowel sounds normal; no masses,  no organomegaly Skin: cluster 1x1 cm of raised papules with overlying scale on lateral aspects of both upper arms. No lesions on hands on wrist.       Assessment and Plan:

## 2012-07-04 NOTE — Assessment & Plan Note (Addendum)
A: chronic x 10 months. Negative cervical cultures. Normal pelvic ultrasound. Exam always normal. History suggestive of endometriosis vs pain related to mood disorder. P: -daily NSAID -prn flexeril  -urged patient to discuss pain symptoms with her therapist ? Benefit from SNRI. I will start this given her multiple other psychiatric medications.  -referral to gyn for evaluation of endometriosis.

## 2012-07-04 NOTE — Assessment & Plan Note (Signed)
A: suspect eczematous rash vs scabies. Improvement with topical steroid favor eczema. P: kenalog ointment BID

## 2012-07-04 NOTE — Assessment & Plan Note (Signed)
A: improved. No suicidal ideation today. Compliant with medications. P: continue current medication regimen. F/u with therapist

## 2012-07-04 NOTE — Assessment & Plan Note (Signed)
A: counseled patient about quitting. She is not ready to quit yet. P: counseling and cessation resources.

## 2012-07-05 ENCOUNTER — Encounter: Payer: Self-pay | Admitting: Family Medicine

## 2012-07-24 ENCOUNTER — Encounter: Payer: Self-pay | Admitting: Family Medicine

## 2012-08-02 ENCOUNTER — Ambulatory Visit (INDEPENDENT_AMBULATORY_CARE_PROVIDER_SITE_OTHER): Payer: Medicaid Other | Admitting: *Deleted

## 2012-08-02 DIAGNOSIS — Z309 Encounter for contraceptive management, unspecified: Secondary | ICD-10-CM

## 2012-08-02 NOTE — Progress Notes (Signed)
Patient here today for Depo Provera.  Last received on 05/17/12.  Gaylene Brooks, RN

## 2012-08-03 MED ORDER — MEDROXYPROGESTERONE ACETATE 150 MG/ML IM SUSP
150.0000 mg | Freq: Once | INTRAMUSCULAR | Status: AC
  Administered 2012-08-02: 150 mg via INTRAMUSCULAR

## 2012-08-16 ENCOUNTER — Encounter: Payer: Self-pay | Admitting: Obstetrics and Gynecology

## 2012-10-31 ENCOUNTER — Ambulatory Visit: Payer: Self-pay

## 2012-11-09 ENCOUNTER — Ambulatory Visit (INDEPENDENT_AMBULATORY_CARE_PROVIDER_SITE_OTHER): Payer: Medicaid Other | Admitting: *Deleted

## 2012-11-09 DIAGNOSIS — Z309 Encounter for contraceptive management, unspecified: Secondary | ICD-10-CM

## 2012-11-09 LAB — POCT URINE PREGNANCY: Preg Test, Ur: NEGATIVE

## 2012-11-10 ENCOUNTER — Other Ambulatory Visit: Payer: Self-pay | Admitting: Family Medicine

## 2012-11-10 MED ORDER — MEDROXYPROGESTERONE ACETATE 150 MG/ML IM SUSP: 150.0000 mg | INTRAMUSCULAR | Status: DC

## 2012-11-10 MED ORDER — MEDROXYPROGESTERONE ACETATE 150 MG/ML IM SUSP
150.0000 mg | Freq: Once | INTRAMUSCULAR | Status: AC
  Administered 2012-11-09: 150 mg via INTRAMUSCULAR

## 2012-11-10 NOTE — Progress Notes (Signed)
Patient late for Depo .  States she has had sex in past two weeks.  Urine preg test performed and is negative.  Patient wants  to go ahead and receive depo today and will return in two weeks for repeat upreg. Also CPE scheduled for 11/22/2012 . Will do pregnancy test then. Advised to use  Extra protection for next 7 days.   Next Depo due April 10 through February 08, 2013.

## 2012-11-20 ENCOUNTER — Other Ambulatory Visit: Payer: Self-pay | Admitting: Family Medicine

## 2012-11-20 MED ORDER — MEDROXYPROGESTERONE ACETATE 150 MG/ML IM SUSP: 150.0000 mg | INTRAMUSCULAR | Status: DC

## 2012-11-22 ENCOUNTER — Encounter: Payer: Self-pay | Admitting: Family Medicine

## 2012-12-07 ENCOUNTER — Encounter: Payer: Self-pay | Admitting: Family Medicine

## 2013-02-08 ENCOUNTER — Telehealth: Payer: Self-pay | Admitting: Family Medicine

## 2013-02-08 MED ORDER — VALACYCLOVIR HCL 500 MG PO TABS: 500.0000 mg | ORAL_TABLET | Freq: Two times a day (BID) | ORAL | Status: DC

## 2013-02-08 NOTE — Telephone Encounter (Signed)
Refill sent in. One pill BID x 3 days with 6 refills for additional recurrences if needed.  Please inform patient.

## 2013-02-08 NOTE — Telephone Encounter (Signed)
Pt is aware that rx was sent to the pharmacy.

## 2013-02-08 NOTE — Telephone Encounter (Signed)
Patient is calling asking for a refill on Valtrex to be sent to [facility] on [address] due to having a Herpes Outbreak.

## 2013-02-08 NOTE — Telephone Encounter (Signed)
Pt has not had this refilled in over a year.  Will forward to MD for further evaluation. [place], Maryjo Rochester

## 2013-02-10 ENCOUNTER — Inpatient Hospital Stay (HOSPITAL_COMMUNITY)
Admission: AD | Admit: 2013-02-10 | Discharge: 2013-02-14 | DRG: 885 | Disposition: A | Discharge status: Home / Self Care | Payer: Medicaid Other | Source: Intra-hospital | Attending: Psychiatry | Admitting: Psychiatry

## 2013-02-10 ENCOUNTER — Emergency Department (HOSPITAL_COMMUNITY)
Admission: EM | Admit: 2013-02-10 | Discharge: 2013-02-10 | Disposition: A | Discharge status: Other Acute Inpatient Hospital | Payer: Medicaid Other | Attending: Emergency Medicine | Admitting: Emergency Medicine

## 2013-02-10 ENCOUNTER — Encounter (HOSPITAL_COMMUNITY): Payer: Self-pay | Admitting: *Deleted

## 2013-02-10 ENCOUNTER — Encounter (HOSPITAL_COMMUNITY): Payer: Self-pay | Admitting: Physical Medicine and Rehabilitation

## 2013-02-10 ENCOUNTER — Telehealth (HOSPITAL_COMMUNITY): Payer: Self-pay | Admitting: *Deleted

## 2013-02-10 DIAGNOSIS — Z862 Personal history of diseases of the blood and blood-forming organs and certain disorders involving the immune mechanism: Secondary | ICD-10-CM | POA: Insufficient documentation

## 2013-02-10 DIAGNOSIS — R45851 Suicidal ideations: Secondary | ICD-10-CM

## 2013-02-10 DIAGNOSIS — B009 Herpesviral infection, unspecified: Secondary | ICD-10-CM | POA: Insufficient documentation

## 2013-02-10 DIAGNOSIS — F3130 Bipolar disorder, current episode depressed, mild or moderate severity, unspecified: Principal | ICD-10-CM | POA: Diagnosis present

## 2013-02-10 DIAGNOSIS — F1210 Cannabis abuse, uncomplicated: Secondary | ICD-10-CM | POA: Insufficient documentation

## 2013-02-10 DIAGNOSIS — IMO0002 Reserved for concepts with insufficient information to code with codable children: Secondary | ICD-10-CM | POA: Insufficient documentation

## 2013-02-10 DIAGNOSIS — Z79899 Other long term (current) drug therapy: Secondary | ICD-10-CM

## 2013-02-10 DIAGNOSIS — F17200 Nicotine dependence, unspecified, uncomplicated: Secondary | ICD-10-CM | POA: Insufficient documentation

## 2013-02-10 DIAGNOSIS — Z3202 Encounter for pregnancy test, result negative: Secondary | ICD-10-CM | POA: Insufficient documentation

## 2013-02-10 DIAGNOSIS — Z8781 Personal history of (healed) traumatic fracture: Secondary | ICD-10-CM | POA: Insufficient documentation

## 2013-02-10 DIAGNOSIS — Z8619 Personal history of other infectious and parasitic diseases: Secondary | ICD-10-CM | POA: Insufficient documentation

## 2013-02-10 DIAGNOSIS — F319 Bipolar disorder, unspecified: Secondary | ICD-10-CM | POA: Insufficient documentation

## 2013-02-10 LAB — CBC WITH DIFFERENTIAL/PLATELET
Eosinophils Relative: 1 % (ref 0–5)
HCT: 36 % (ref 36.0–46.0)
Hemoglobin: 12.9 g/dL (ref 12.0–15.0)
Lymphocytes Relative: 23 % (ref 12–46)
MCHC: 35.8 g/dL (ref 30.0–36.0)
MCV: 81.1 fL (ref 78.0–100.0)
Monocytes Absolute: 0.5 10*3/uL (ref 0.1–1.0)
Monocytes Relative: 7 % (ref 3–12)
Neutro Abs: 5 10*3/uL (ref 1.7–7.7)
WBC: 7.3 10*3/uL (ref 4.0–10.5)

## 2013-02-10 LAB — RAPID URINE DRUG SCREEN, HOSP PERFORMED: Amphetamines: NOT DETECTED

## 2013-02-10 LAB — SALICYLATE LEVEL: Salicylate Lvl: <2 mg/dL — ABNORMAL LOW (ref 2.8–20.0)

## 2013-02-10 LAB — ACETAMINOPHEN LEVEL: Acetaminophen (Tylenol), Serum: <15 ug/mL (ref 10–30)

## 2013-02-10 LAB — ETHANOL: Alcohol, Ethyl (B): <11 mg/dL (ref 0–11)

## 2013-02-10 LAB — COMPREHENSIVE METABOLIC PANEL
BUN: 8 mg/dL (ref 6–23)
CO2: 21 mEq/L (ref 19–32)
Calcium: 9.3 mg/dL (ref 8.4–10.5)
Chloride: 102 mEq/L (ref 96–112)
Creatinine, Ser: 0.59 mg/dL (ref 0.50–1.10)
GFR calc Af Amer: >90 mL/min (ref >90)
GFR calc non Af Amer: >90 mL/min (ref >90)
Total Bilirubin: 0.6 mg/dL (ref 0.3–1.2)

## 2013-02-10 LAB — POCT PREGNANCY, URINE: Preg Test, Ur: NEGATIVE

## 2013-02-10 MED ORDER — MAGNESIUM HYDROXIDE 400 MG/5ML PO SUSP
30.0000 mL | Freq: Every day | ORAL | Status: DC | PRN
  Administered 2013-02-13: 30 mL via ORAL

## 2013-02-10 MED ORDER — POTASSIUM CHLORIDE CRYS ER 20 MEQ PO TBCR
40.0000 meq | EXTENDED_RELEASE_TABLET | Freq: Once | ORAL | Status: AC
  Administered 2013-02-10: 40 meq via ORAL
  Filled 2013-02-10: qty 2

## 2013-02-10 MED ORDER — TRAZODONE HCL 50 MG PO TABS
50.0000 mg | ORAL_TABLET | Freq: Every evening | ORAL | Status: DC | PRN
  Administered 2013-02-10: 50 mg via ORAL
  Filled 2013-02-10: qty 1

## 2013-02-10 MED ORDER — VALACYCLOVIR HCL 500 MG PO TABS
500.0000 mg | ORAL_TABLET | Freq: Two times a day (BID) | ORAL | Status: DC
  Administered 2013-02-10 – 2013-02-14 (×8): 500 mg via ORAL
  Filled 2013-02-10 (×13): qty 1

## 2013-02-10 MED ORDER — NICOTINE 14 MG/24HR TD PT24
14.0000 mg | MEDICATED_PATCH | Freq: Every day | TRANSDERMAL | Status: DC
  Administered 2013-02-11 – 2013-02-14 (×4): 14 mg via TRANSDERMAL
  Filled 2013-02-10 (×9): qty 1

## 2013-02-10 MED ORDER — ALUM & MAG HYDROXIDE-SIMETH 200-200-20 MG/5ML PO SUSP: 30.0000 mL | ORAL | Status: DC | PRN

## 2013-02-10 MED ORDER — ACETAMINOPHEN 325 MG PO TABS: 650.0000 mg | ORAL_TABLET | Freq: Four times a day (QID) | ORAL | Status: DC | PRN

## 2013-02-10 MED ORDER — OXCARBAZEPINE 150 MG PO TABS
150.0000 mg | ORAL_TABLET | Freq: Two times a day (BID) | ORAL | Status: DC
  Administered 2013-02-10 – 2013-02-11 (×2): 150 mg via ORAL
  Filled 2013-02-10 (×5): qty 1

## 2013-02-10 NOTE — ED Notes (Signed)
Pt stated that she has been having a lot going on at home. She stated that her stress levels has increased. She stated that she was banging her head against her wall at home. She stated that she is hearing voices that are telling her she needs to die. Currently no suicidal plan. Will continue to monitor. Will continue to monitor.

## 2013-02-10 NOTE — Tx Team (Signed)
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  The patient and family have been given the opportunity to ask questions and make suggestions.  Rich Brave 02/10/2013, 6:56 PM
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
## 2013-02-10 NOTE — ED Notes (Signed)
Pt presents to department for evaluation of suicidal ideation. States she was "banging" her head and neck against wall this morning. States she feels like dying. Denies HI. History of bipolar disorder, depression, and anxiety. States headache at present. She is calm and cooperative.
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
## 2013-02-10 NOTE — ED Notes (Signed)
Report given to Pod C RN.  

## 2013-02-10 NOTE — ED Notes (Addendum)
Tiffany Wong with [facility] Assessment stated that patient has a bed at [facility]

## 2013-02-10 NOTE — Progress Notes (Signed)
Nrsg Admit note Pt is 22 yo black female admitted to [facility] due to needing med adjustment for bipolar. She states she comes here voluntarily, she was a  Patient here in Sept 2013, that she knows " somehting's wrong" and wants help now. She is the mother of an 81 mth old son and recenly was attending college and living with her  Baba's father, but states she has had work hours cut recently because she was having diff coping , having frequent meltdowns at work ( she says she works at [employer]) and that now she's even more stressed out because her income has been drastically decreased. SHe  denies active suicidality. But contracts readily with  This Clinical research associate  when asked if she can and will stay safe.She says one of her main stressors is that she had to drop out of college recently, because she couldn't get her work done and also that her meds have been adjusted and " I guess it didn't work for me". She reprots being allergic to aspirin ( " it makes my nose bleed") , she denies any type of surgical history in the past and states her only medical issue is that she  Is currently receiving valtrex 500 mg bid due to outbreak of genital herpes. Her skin assessment is completed without sign findings and she is oriented to the unit and verbal reprot given to night nurse.

## 2013-02-10 NOTE — Progress Notes (Signed)
[facility] Group Notes:  (Nursing/MHT/Case Management/Adjunct)  Date:  02/10/2013  Time:  2000  Type of Therapy:  Psychoeducational Skills  Participation Level:  Minimal  Participation Quality:  Appropriate  Affect:  Depressed  Cognitive:  Lacking  Insight:  Limited  Engagement in Group:  Limited  Modes of Intervention:  Education  Summary of Progress/Problems: The patient had little to share with the group since this was her first day in the hospital. Her goal for tomorrow is to attend all of her groups.   Derrill Kay, Jonthan Leite S 02/10/2013, 10:10 PM

## 2013-02-10 NOTE — Progress Notes (Signed)
D.  Pt pleasant on approach, anxious.  Pt wanted to be sure her Valtrex and Trileptal were ordered because she had not had them.  Pt positive for evening group, interacting appropriately within milieu.  Denies SI/HI/hallucinations at this time.  A.  Support and encouragement offered, called doctor for new orders of medications pt was taking, orders received.  R.  Pt remains safe on unit, will continue to monitor.

## 2013-02-11 ENCOUNTER — Encounter (HOSPITAL_COMMUNITY): Payer: Self-pay | Admitting: Physician Assistant

## 2013-02-11 DIAGNOSIS — F4310 Post-traumatic stress disorder, unspecified: Secondary | ICD-10-CM

## 2013-02-11 MED ORDER — OXCARBAZEPINE 300 MG PO TABS
300.0000 mg | ORAL_TABLET | Freq: Two times a day (BID) | ORAL | Status: DC
  Administered 2013-02-11 – 2013-02-14 (×6): 300 mg via ORAL
  Filled 2013-02-11 (×10): qty 1

## 2013-02-11 MED ORDER — LURASIDONE HCL 40 MG PO TABS
40.0000 mg | ORAL_TABLET | Freq: Every day | ORAL | Status: DC
  Administered 2013-02-11 – 2013-02-14 (×4): 40 mg via ORAL
  Filled 2013-02-11 (×6): qty 1

## 2013-02-11 MED ORDER — IBUPROFEN 800 MG PO TABS
800.0000 mg | ORAL_TABLET | Freq: Three times a day (TID) | ORAL | Status: DC | PRN
  Administered 2013-02-11 – 2013-02-13 (×5): 800 mg via ORAL
  Filled 2013-02-11 (×5): qty 1

## 2013-02-11 MED ORDER — MIRTAZAPINE 15 MG PO TABS
15.0000 mg | ORAL_TABLET | Freq: Every day | ORAL | Status: DC
  Administered 2013-02-11 – 2013-02-13 (×3): 15 mg via ORAL
  Filled 2013-02-11 (×5): qty 1

## 2013-02-11 NOTE — ED Provider Notes (Signed)
  Medical screening examination/treatment/procedure(s) were performed by non-physician practitioner and as supervising physician I was immediately available for consultation/collaboration.    Vida Roller, MD 02/11/13 [phone]

## 2013-02-11 NOTE — Progress Notes (Signed)
Psychoeducational Group Note  Date:  02/11/2013 Time: 1015 Group Topic/Focus:  Making Healthy Choices:   The focus of this group is to help patients identify negative/unhealthy choices they were using prior to admission and identify positive/healthier coping strategies to replace them upon discharge.  Participation Level:  Active  Participation Quality:  Appropriate  Affect:  Depressed  Cognitive:  Appropriate  Insight:  Engaged  Engagement in Group:  Engaged  Additional Comments:    Rich Brave 1:56 PM. 02/11/2013

## 2013-02-11 NOTE — Progress Notes (Signed)
Tiffany Wong is seen OOB UAL on the 500 hall...tolerated well. She is bright. She makes pleasant conversation with this Clinical research associate. She denies active SI,but that she cont to have " off and on thoughts of SI", she  states her depression and hopelessness are rated " 4 / 4 ", respectively and says her DC plan is to : " use  coping skills  Instead of acting out of raw emotion".    A She is engaged in her recovery as evidenced by her compliance in taking her scheduled meds as well as attending her groups as planned. She asks appropriate questions,  Participates  in the group discusssions and shares personal stories of her life experiences so that her peers can  Benefit. ]    R Safety is in place and POC cont with therapeutic relationship fostered.

## 2013-02-11 NOTE — Progress Notes (Signed)
Goals  Group Note  Date: 02/11/2013 Time:  0915  Group Topic/Focus:  Making Healthy Choices:   The focus of this group is to help patients identify negative/unhealthy choices they were using prior to admission and identify positive/healthier coping strategies to replace them upon discharge.  Participation Level:  Active  Participation Quality:  Appropriate  Affect:  Appropriate  Cognitive:  Appropriate  Insight:  Engaged  Engagement in Group:  Engaged  Additional Comments:    02/11/2013,12:51 PM Ensley Blas, Joie Bimler

## 2013-02-11 NOTE — Progress Notes (Signed)
[facility] Group Notes:  (Nursing/MHT/Case Management/Adjunct)  Date:  02/11/2013  Time:  2000  Type of Therapy:  Psychoeducational Skills  Participation Level:  Active  Participation Quality:  Appropriate  Affect:  Appropriate  Cognitive:  Appropriate  Insight:  Improving  Engagement in Group:  Engaged  Modes of Intervention:  Education  Summary of Progress/Problems: The patient shared with the group that she had a good day overall. She mentioned that she laughed more than yesterday and was able to go outside for fresh air. The patient also had a visit with her child along with her husband. Her goal for tomorrow is to feel better.    [name] 02/11/2013, 10:19 PM

## 2013-02-11 NOTE — Progress Notes (Signed)
D. Pt pleasant on approach, denies complaints other than chronic neck pain at this time.  She rates the pain a 4/10.  Positive for evening group, see group notes.  Interacting appropriately within milieu. States that she had a good day, and an enjoyable visit with her 66 month old son.  Denies SI/HI/hallucinations at this time.  A.  Support and encouragement offered, medication given as ordered for neck pain.  R.  Pt remains safe on unit, will continue to monitor.
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
## 2013-02-11 NOTE — [facility] Group Notes (Signed)
[facility] Group Notes:  (Clinical Social Work)  02/11/2013   3:00-4:00PM  Summary of Progress/Problems:   The main focus of today's process group was to   identify the patient's current support system and decide on other supports that can be put in place.  Four definitions/levels of support were discussed and an exercise was utilized to show how much stronger we become with additional supports.  An emphasis was placed on using counselor, doctor, therapy groups, 12-step groups, and problem-specific support groups to expand supports, as well as doing something different than has been done before. The patient expressed thorough understanding of the need for change, as well as the personal responsibility to make such change. When another patient was very resistant to topic and somewhat challenging, this patient expressed that supports are needed, and that this time in the hospital should be taken to work on oneself.  She stated that if a person wants something badly enough, they will do what it takes to go after it.  Type of Therapy:  Process Group  Participation Level:  Active  Participation Quality:  Appropriate, Attentive and Sharing  Affect:  Appropriate  Cognitive:  Appropriate  Insight:  Engaged  Engagement in Therapy:  Engaged  Modes of Intervention:  Education,  Teacher, English as a foreign language, Exploration, Discussion   Ambrose Mantle, LCSW 02/11/2013, 4:57 PM

## 2013-02-11 NOTE — [facility] Suicide Risk Assessment (Signed)
Suicide Risk Assessment  Admission Assessment     Nursing information obtained from:   Patient. Demographic factors:  Adolescent or young adult;Low socioeconomic status Current Mental Status:  Suicidal ideation indicated by patient Loss Factors:  Decrease in vocational status;Loss of significant relationship;Financial problems / change in socioeconomic status Historical Factors:   Previous suicide attempts.  Risk Reduction Factors:   Child dependent on care  CLINICAL FACTORS:   Bipolar Disorder:   Depressive phase Alcohol/Substance Abuse/Dependencies Chronic Pain Medical Diagnoses and Treatments/Surgeries  COGNITIVE FEATURES THAT CONTRIBUTE TO RISK:  Polarized thinking Thought constriction (tunnel vision)    SUICIDE RISK:   Moderate:  Frequent suicidal ideation with limited intensity, and duration, some specificity in terms of plans, no associated intent, good self-control, limited dysphoria/symptomatology, some risk factors present, and identifiable protective factors, including available and accessible social support.  PLAN OF CARE: Observation Level/Precautions: 15 minute checks   Laboratory: Per emergency department   Psychotherapy: Attend groups   Medications: Trileptal 300 mg twice daily, Remeron 30 mg at bedtime, Latuda 40 mg each morning.   Consultations: None   Discharge Concerns: Risk for self-harm and suicide   Estimated LOS: 5-7 days   Other: N/A     I certify that inpatient services furnished can reasonably be expected to improve the patient's condition.  Alfie Alderfer 02/11/2013, 3:23 PM

## 2013-02-12 DIAGNOSIS — F3160 Bipolar disorder, current episode mixed, unspecified: Secondary | ICD-10-CM

## 2013-02-12 NOTE — [facility] Group Notes (Signed)
[facility] LCSW Group Therapy         Overcoming Obstacles 1:15 2:30 PM         .  02/12/2013 3:10 PM  Type of Therapy:  Group Therapy  Participation Level:  Active  Participation Quality:  Appropriate  Affect:  Appropriate  Cognitive:  Appropriate  Insight:  Defensive, Developing/Improving and Engaged  Engagement in Therapy:  Developing/Improving and Engaged  Modes of Intervention:  Discussion, Education, Exploration, Problem-Solving, Rapport Building, Support   Summary of Progress/Problems:  Patient advised the obstacle she needs to overcome is noncompliance with medications.  She shared the information on suicide education this morning helped her to understand her family history and the need to take her medications.  She shared she does not want to put her child through a possible suicide attempt as suicide and Bipolar Disorder runs in her family.  Wynn Banker 02/12/2013, 3:10 PM
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
## 2013-02-12 NOTE — Progress Notes (Signed)
D: Patient denies SI/HI/AVH. Patient rates hopelessness as 0,  depression as 2, and anxiety as 3.  Patient affect is appropriate. Mood is depressed.  Pt states "The doctors explained what my new meds are going to do.  I have a lot of support from my son's father.  I feel really good."  Patient did attend evening group. Patient visible on the milieu. No distress noted. A: Support and encouragement offered. Scheduled medications given to pt. Q 15 min checks continued for patient safety. R: Patient receptive. Patient remains safe on the unit.

## 2013-02-12 NOTE — Progress Notes (Signed)
Adult Psychoeducational Group Note  Date:  02/12/2013 Time:  9:12 PM  Group Topic/Focus:  Rediscovering Joy:   The focus of this group is to explore various ways to relieve stress in a positive manner.  Participation Level:  Active  Participation Quality:  Appropriate and Supportive  Affect:  Appropriate and Excited  Cognitive:  Alert, Appropriate and Oriented  Insight: Appropriate and Good  Engagement in Group:  Engaged and Supportive  Modes of Intervention:  Problem-solving and Socialization  Additional Comments:  Tiffany Wong was very alert through out group.  She told the group how her first step to becoming a better person was seeking help.  Tiffany Wong also provided the group with positive feedback.   Tiffany Wong [place] 02/12/2013, 9:12 PM
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
## 2013-02-12 NOTE — [facility] Counselor (Signed)
Adult Psychosocial Assessment Update Interdisciplinary Team  Previous [name] admissions/discharges:  Admissions Discharges  Date:06/23/12 Date: 06/27/12  Date: Date:  Date: Date:  Date: Date:  Date: Date:   Changes since the last Psychosocial Assessment (including adherence to outpatient mental health and/or substance abuse treatment, situational issues contributing to decompensation and/or relapse). Patient reports admitting to hospital due to increased depression and SI.  She advised she had to withdraw from school due to inability to focuses on assignments.  She advised she is having difficulty at work due to know being able to focus.  Patient reports having a problems with being compliant with medications.             Discharge Plan 1. Will you be returning to the same living situation after discharge?   Yes: No:      If no, what is your plan?    Yes, patient plans to return to her home at discharge.       2. Would you like a referral for services when you are discharged? Yes:     If yes, for what services?  No:       No.  Patient plans to continues services with Dr. Myrtie Neither and Leroy Libman at [facility].       Summary and Recommendations (to be completed by the evaluator) Tiffany Wong is a 22 years old African American female admitted with Bipolar Disorder.  She will ,Patient will benefit from crisis stabilization, evaluation for medication management, psycho education groups for coping skills development, group therapy and assistance with discharge planning.                        Signature:  Wynn Banker, 02/12/2013 3:41 PM

## 2013-02-12 NOTE — Progress Notes (Signed)
Patient ID: Tiffany Wong, female   DOB: 1991-05-27, 22 y.o.   MRN: [id number] D- Patient reports she slept well and that her appetite is improving.  Her energy level is normal and she rates her depression at 4 and hopelessness at 3.  She denies thoughts of self harm  A- Asked patient to set a goal for today and R- She says she wants to try to feel better about herself.. When asked how she plans to make progress on that today, she was unable to articulate a plan.  Talked with her about replacing any negative self talk with positive and to develop positive statements about herself to use. Pt has pain in her neck and forehead.  Forehead pain is from banging head on wall prior to admission.  Talked with patient about alternatives to self harm.  Patient is able to name her meds and why she takes them.

## 2013-02-13 DIAGNOSIS — F411 Generalized anxiety disorder: Secondary | ICD-10-CM

## 2013-02-13 MED ORDER — POTASSIUM CHLORIDE CRYS ER 10 MEQ PO TBCR
10.0000 meq | EXTENDED_RELEASE_TABLET | Freq: Two times a day (BID) | ORAL | Status: DC
  Administered 2013-02-13 – 2013-02-14 (×2): 10 meq via ORAL
  Filled 2013-02-13 (×7): qty 1

## 2013-02-13 NOTE — [facility] Group Notes (Signed)
[facility] LCSW Aftercare Discharge Planning Group Note   02/13/2013 11:52 AM  Participation Quality:  Appropriate  Mood/Affect:  Appropriate  Depression Rating:  2  Anxiety Rating:  3  Thoughts of Suicide:  No  Will you contract for safety?   NA  Current AVH:  No  Plan for Discharge/Comments:  Patient being a bit impulsive today.  She shared she feels a bit manic as though she could take on the world.  She asked that her employer be notified of her hospitalization.  Transportation Means: Patient has transportation.   Supports:  Patient has a good support system.   Diogenes Whirley, Joesph July
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
## 2013-02-13 NOTE — [facility] Group Notes (Signed)
[facility] LCSW Group Therapy      Feelings About Diagnosis 1:15 - 2:30 PM          02/13/2013 11:54 AM  Type of Therapy:  Group Therapy  Participation Level:  Active  Participation Quality:  Appropriate and Attentive  Affect:  Appropriate  Cognitive:  Appropriate  Insight:  Developing/Improving and Engaged  Engagement in Therapy:  Developing/Improving and Engaged  Modes of Intervention:  Discussion, Education, Exploration, Problem-Solving, Rapport Building, Support  Summary of Progress/Problems:  Patient shared she knows she is not playing with a "full deck" and that people are always told her she is crazy.  Patient was able to state she needs to take her medications and follow up with appointments to be at her best.  Patients was challenged to think her cards may be out of order, at times, but all the cards are there.  Wynn Banker 02/13/2013, 11:54 AM
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
## 2013-02-13 NOTE — Progress Notes (Signed)
Adult Psychoeducational Group Note  Date:  02/13/2013 Time:  11:51 AM  Group Topic/Focus:  Therapeutic Activity- "Apples to Apples"  Participation Level:  Active  Participation Quality:  Appropriate, Attentive and Sharing  Affect:  Appropriate  Cognitive:  Appropriate  Insight: Appropriate  Engagement in Group:  Engaged  Modes of Intervention:  Activity  Additional Comments:  Pt was appropriate and sharing while attending group. Pt was the judge during the group and did not feel that it was unfair how she chose the winning card. Staff communicated that everyone's preception is different and this is ok.   Sharyn Lull 02/13/2013, 11:51 AM

## 2013-02-13 NOTE — Progress Notes (Signed)
Patient ID: Tiffany Wong, female   DOB: 02-12-1991, 22 y.o.   MRN: [id number] D- Patient reports she slept fair.  Her appetite is improving.  Her energy level is low.  Her ability to pay attention is improving.  She rates her depression at 2 and hopelessness is 0.  She rates her anxiety at 3.  She is attending groups and interacting with peers and staff.  Talked with patient about her impulsivity.  Says she sometimes does things before she realizes she is going to. A- Supported patient.  R- patient says she is a little constipated and plans to request mom tonight.

## 2013-02-13 NOTE — Progress Notes (Signed)
Adult Psychoeducational Group Note  Date:  02/13/2013 Time:  9:29 PM  Group Topic/Focus:  Wrap-Up Group:   The focus of this group is to help patients review their daily goal of treatment and discuss progress on daily workbooks.  Participation Level:  Active  Participation Quality:  Appropriate  Affect:  Appropriate  Cognitive:  Alert and Appropriate  Insight: Appropriate  Engagement in Group:  Engaged and Supportive  Modes of Intervention:  Discussion and Problem-solving  Additional Comments:  Patient stated that she had a good day. Patient stated that the day began with her waking up anxious, but the gym provided her with comfort, "all that was missing was a barbeque!" Patient stated that her goal for today was to focus on herself. Patient's goal for tomorrow is to "retrain her brain to stop negative thoughts...refocus." Patient plans to accomplish this goal using the following techniques: counting to twenty then shouting her name (something she learned in group today), and speaking positive affirmations.   Lyndee Hensen 02/13/2013, 9:29 PM

## 2013-02-14 DIAGNOSIS — F3130 Bipolar disorder, current episode depressed, mild or moderate severity, unspecified: Principal | ICD-10-CM

## 2013-02-14 DIAGNOSIS — F1910 Other psychoactive substance abuse, uncomplicated: Secondary | ICD-10-CM

## 2013-02-14 MED ORDER — MEDROXYPROGESTERONE ACETATE 150 MG/ML IM SUSP: 150.0000 mg | INTRAMUSCULAR | Status: DC

## 2013-02-14 MED ORDER — MIRTAZAPINE 15 MG PO TABS: 15.0000 mg | ORAL_TABLET | Freq: Every day | ORAL | Status: DC

## 2013-02-14 MED ORDER — VALACYCLOVIR HCL 500 MG PO TABS: 500.0000 mg | ORAL_TABLET | Freq: Two times a day (BID) | ORAL | Status: DC

## 2013-02-14 MED ORDER — OXCARBAZEPINE 300 MG PO TABS: 300.0000 mg | ORAL_TABLET | Freq: Two times a day (BID) | ORAL | Status: DC

## 2013-02-14 MED ORDER — LURASIDONE HCL 40 MG PO TABS: 40.0000 mg | ORAL_TABLET | Freq: Every day | ORAL | Status: DC

## 2013-02-14 NOTE — Progress Notes (Signed)
Adult Psychoeducational Group Note  Date:  02/14/2013 Time:  12:02 PM  Group Topic/Focus:  Identifying Needs:   The focus of this group is to help patients identify their personal needs that have been historically problematic and identify healthy behaviors to address their needs.  Participation Level:  Active  Participation Quality:  Appropriate  Affect:  Appropriate  Cognitive:  Alert and Oriented  Insight: Appropriate and Good  Engagement in Group:  Engaged  Modes of Intervention:  Discussion, Education and Socialization  Additional Comments:  PT stated her goal is to reinforce coping skills she has learned in the last few days.  Revella Shelton T 02/14/2013, 12:02 PM
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
## 2013-02-14 NOTE — Progress Notes (Signed)
Pt was discharged home today.  She denied any S/I H/I or A/V hallucinations. She was given f/u appointment, rx hotline info booklet. She voiced understanding to all instructions provided.   She declined the need for smoking cessation materials.  She removed her nicotine patch before she left.

## 2013-02-14 NOTE — BHH Group Notes (Signed)
[facility] LCSW Aftercare Discharge Planning Group Note   02/14/2013 12:19 PM  Participation Quality:  Appropriate  Mood/Affect:  Appropriate  Depression Rating:  1  Anxiety Rating:  3  Thoughts of Suicide:  No  Will you contract for safety?   NA  Current AVH:  No  Plan for Discharge/Comments:  Patient reports doing really well today and being ready to discharge home.  She plans to follow up with outpatient appointments and stay on her medications.  Transportation Means: Patient has transportation.   Supports:  Patient has a good support system.   Sean Macwilliams, Joesph July

## 2013-02-14 NOTE — Progress Notes (Signed)
Writer observed patient sitting in the dayroom watching tv with peers. Writer spoke with patient 1:1 and she reported that her day has been good overall. Patient reports that after discharge she plans to stay on her medications and continue therapy but would like to see her therapist once a week instead of every 2 weeks. Patient reported that she enjoyed going to the gym today and how much fun she had. Support and encouragement offered, Clinical research associate informed patient of medication scheduled and she is agreeable to plan of care. Safety maintained on unit, will continue to monitor.

## 2013-02-14 NOTE — Tx Team (Signed)
Interdisciplinary Treatment Plan Update   Date Reviewed:  02/14/2013  Time Reviewed:  9:47 AM  Progress in Treatment:   Attending groups: Yes Participating in groups: Yes Taking medication as prescribed: Yes  Tolerating medication: Yes Family/Significant other contact made:Yes, contact made with boyfriend. Patient understands diagnosis: Yes  Discussing patient identified problems/goals with staff: Yes Medical problems stabilized or resolved: Yes Denies suicidal/homicidal ideation: Yes Patient has not harmed self or others: Yes  For review of initial/current patient goals, please see plan of care.  Estimated Length of Stay:  Discharge today.  Reasons for Continued Hospitalization:  i New Problems/Goals identified:    Discharge Plan or Barriers:   Home with outpatient follow up  Additional Comments:    Patient reports doing well and being ready to discharge home today.  She is rating all symptoms at zeros today.  Patient shared she will be compliant with medications and appointments.  Attendees:  Patient:   Tiffany Wong  02/14/2013 9:47 AM   Signature: Patrick North, MD 02/14/2013 9:47 AM  Signature:   Harold Barban, RN 02/14/2013 9:47 AM  Signature:  02/14/2013 9:47 AM  Signature: 02/14/2013 9:47 AM  Signature:  Robbie Louis, RN 02/14/2013 9:47 AM  Signature:  Juline Patch, LCSW 02/14/2013 9:47 AM  Signature: Jerl Santos, RN Student 02/14/2013 9:47 AM  Signature:  02/14/2013 9:47 AM  Signature: Silverio Decamp, PMH NP 02/14/2013 9:47 AM  Signature:    Signature:    Signature:      Scribe for Treatment Team:   Juline Patch,  02/14/2013 9:47 AM

## 2013-02-14 NOTE — Progress Notes (Signed)
[facility] Adult Case Management Discharge Plan :  Will you be returning to the same living situation after discharge: Yes,  Patient will be returning to her home. At discharge, do you have transportation home?:Yes,  Patient will arrange her own transportation. Do you have the ability to pay for your medications:Yes,  Patient is able to obtain medications.  Release of information consent forms completed and in the chart;  Patient's signature needed at discharge.  Patient to Follow up at: Follow-up Information   Follow up with Dr. Lauris Poag On 02/14/2013. (You are scheduled with Lauris Poag today at 2:00 PM)    Contact information:   [address] [place], [place]   [address]  [phone]      Follow up with Leroy Libman [facility] On 03/08/2013. (Thursday Mar 08, 2013 at [facility])    Contact information:   [address] [place], [place]  [address]  [phone]      Patient denies SI/HI:   Patient no longer endorsing SI/HI or other thoughts of self harm.   Safety Planning and Suicide Prevention discussed: .Reviewed with all patients during discharge planning group   Elias Dennington, Joesph July 02/14/2013, 9:45 AM

## 2013-02-14 NOTE — BHH Suicide Risk Assessment (Signed)
BHH INPATIENT:  Family/Significant Other Suicide Prevention Education  Suicide Prevention Education:  Education Completed;Quention Elder, Steffanie Rainwater, [phone] been identified by the patient as the family member/significant other with whom the patient will be residing, and identified as the person(s) who will aid the patient in the event of a mental health crisis (suicidal ideations/suicide attempt).  With written consent from the patient, the family member/significant other has been provided the following suicide prevention education, prior to the and/or following the discharge of the patient.  The suicide prevention education provided includes the following:  Suicide risk factors  Suicide prevention and interventions  National Suicide Hotline telephone number  [facility] assessment telephone number  [facility] Emergency Assistance 911  [facility] and/or Residential Mobile Crisis Unit telephone number  Request made of family/significant other to:  Remove weapons (e.g., guns, rifles, knives), all items previously/currently identified as safety concern.  Steffanie Rainwater reports patient does not have access to a gun.  Remove drugs/medications (over-the-counter, prescriptions, illicit drugs), all items previously/currently identified as a safety concern.  The family member/significant other verbalizes understanding of the suicide prevention education information provided.  The family member/significant other agrees to remove the items of safety concern listed above.  Wynn Banker 02/14/2013, 12:17 PM
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
## 2013-02-14 NOTE — Progress Notes (Signed)
Adult Psychoeducational Group Note  Date:  02/14/2013 Time:  11:05 AM  Group Topic/Focus:  Therapeutic Activity Our Perception "Apples to Apples"  Participation Level:  Active  Participation Quality:  Appropriate  Affect:  Appropriate  Cognitive:  Appropriate  Insight: Appropriate  Engagement in Group:  Engaged  Modes of Intervention:  Activity  Additional Comments: Pt was appropriate and sharing while attending group. Pt participated in a therapeutic activity that highlighted every patient's perception. Pt appeared pleasant and excited about the interactions that took place during this activity.    Sharyn Lull 02/14/2013, 11:05 AM

## 2013-02-14 NOTE — Plan of Care (Signed)
Problem: Alteration in mood Goal: LTG-Pt's behavior demonstrates decreased signs of depression Patient is rating depression at zero. She has attended groups and talked about concerns.  Horace Porteous Parke Jandreau, LCSW 02/14/2013 Outcome: Completed/Met Date Met:  02/14/13 Patient  Is rating depression at one.  She will continue to follow up with outpatient therapist, Leroy Libman at [facility].  Horace Porteous Arta Stump, LCSW 02/14/2013

## 2013-02-14 NOTE — Plan of Care (Signed)
Problem: Ineffective individual coping Goal: STG: Patient will participate in after care plan Patient has attended groups and talked about problems. Horace Porteous Aditi Rovira, LCSW 02/14/2013 Outcome: Completed/Met Date Met:  02/14/13 Patient has been easily engaged in groups and states she understand she needs to be compliant with medications and outpatient appointments.  Horace Porteous Iran Kievit, LCSW 02/14/2013

## 2013-02-19 NOTE — Progress Notes (Signed)
Patient Discharge Instructions:  After Visit Summary (AVS):   Faxed to:  02/19/13 Discharge Summary Note:   Faxed to:  02/19/13 Psychiatric Admission Assessment Note:   Faxed to:  02/19/13 Suicide Risk Assessment - Discharge Assessment:   Faxed to:  02/19/13 Faxed/Sent to the Next Level Care provider:  02/19/13 Faxed to [facility] @ [phone]  Jerelene Redden, 02/19/2013, 4:10 PM

## 2013-03-19 ENCOUNTER — Ambulatory Visit (INDEPENDENT_AMBULATORY_CARE_PROVIDER_SITE_OTHER): Payer: Medicaid Other | Admitting: Family Medicine

## 2013-03-19 ENCOUNTER — Encounter: Payer: Self-pay | Admitting: Family Medicine

## 2013-03-19 VITALS — BP 114/75 | HR 78 | Temp 98.3°F | Ht 61.0 in | Wt 106.0 lb

## 2013-03-19 DIAGNOSIS — S99911A Unspecified injury of right ankle, initial encounter: Secondary | ICD-10-CM | POA: Insufficient documentation

## 2013-03-19 DIAGNOSIS — Z309 Encounter for contraceptive management, unspecified: Secondary | ICD-10-CM

## 2013-03-19 DIAGNOSIS — R079 Chest pain, unspecified: Secondary | ICD-10-CM

## 2013-03-19 DIAGNOSIS — Z720 Tobacco use: Secondary | ICD-10-CM

## 2013-03-19 DIAGNOSIS — F17200 Nicotine dependence, unspecified, uncomplicated: Secondary | ICD-10-CM

## 2013-03-19 DIAGNOSIS — R109 Unspecified abdominal pain: Secondary | ICD-10-CM

## 2013-03-19 DIAGNOSIS — S8990XA Unspecified injury of unspecified lower leg, initial encounter: Secondary | ICD-10-CM

## 2013-03-19 HISTORY — DX: Unspecified injury of right ankle, initial encounter: S99.911A

## 2013-03-19 LAB — POCT URINE PREGNANCY: Preg Test, Ur: NEGATIVE

## 2013-03-19 LAB — POCT URINALYSIS DIPSTICK
Blood, UA: NEGATIVE
Leukocytes, UA: NEGATIVE
Nitrite, UA: NEGATIVE
Protein, UA: 30
Urobilinogen, UA: 0.2
pH, UA: 7

## 2013-03-19 LAB — POCT UA - MICROSCOPIC ONLY

## 2013-03-19 MED ORDER — MEDROXYPROGESTERONE ACETATE 150 MG/ML IM SUSP
150.0000 mg | Freq: Once | INTRAMUSCULAR | Status: AC
  Administered 2013-03-19: 150 mg via INTRAMUSCULAR

## 2013-03-19 MED ORDER — NICOTINE 14 MG/24HR TD PT24: 1.0000 | MEDICATED_PATCH | TRANSDERMAL | Status: DC

## 2013-03-19 MED ORDER — NICOTINE 7 MG/24HR TD PT24: 1.0000 | MEDICATED_PATCH | TRANSDERMAL | Status: DC

## 2013-03-19 MED ORDER — NICOTINE 21 MG/24HR TD PT24: 1.0000 | MEDICATED_PATCH | TRANSDERMAL | Status: DC

## 2013-03-19 NOTE — Patient Instructions (Signed)
Tiffany Wong,  Thank you for coming in today. Your next pap is due in 2015.   Please get chest and R ankle x-ray.  In the meantime, use compression dressing, ice and elevate your R ankle.   I am checking lipase to make sure your pain is not at all related to your pancrease. I have sent in the nicotine patch take 21 mg patch daily for 6-8 weeks, then 14 mg patch for 2 weeks, then 7 for two weeks, then quit  Urine is negative for infection.   Dr. Armen Pickup

## 2013-03-19 NOTE — Assessment & Plan Note (Signed)
A: ready to quit. P: nicotine patch 21 mg x 6 weeks, 14 mg x 2, 7 mg x 2.

## 2013-03-19 NOTE — Assessment & Plan Note (Addendum)
A: pleuritic chest pain, reproducible with deep breathing and palpation. I suspect costochondritis. Also considered PE, but the intermittent nature of her pain is not consistent with this.  P: CXR nsaids for pain Check lipase to rule out pancreatitis.
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
## 2013-03-20 ENCOUNTER — Ambulatory Visit
Admission: RE | Admit: 2013-03-20 | Discharge: 2013-03-20 | Disposition: A | Discharge status: Home / Self Care | Payer: Medicaid Other | Source: Ambulatory Visit | Attending: Family Medicine | Admitting: Family Medicine

## 2013-03-20 ENCOUNTER — Encounter: Payer: Self-pay | Admitting: *Deleted

## 2013-03-20 ENCOUNTER — Telehealth: Payer: Self-pay | Admitting: Family Medicine

## 2013-03-20 DIAGNOSIS — R079 Chest pain, unspecified: Secondary | ICD-10-CM

## 2013-03-20 DIAGNOSIS — S99911A Unspecified injury of right ankle, initial encounter: Secondary | ICD-10-CM

## 2013-03-20 NOTE — Telephone Encounter (Signed)
Attempted to call patient with x-ray results. She was unavailable. Unable to leave VM. Letter sent.

## 2013-03-26 ENCOUNTER — Encounter (HOSPITAL_COMMUNITY): Payer: Self-pay | Admitting: Family Medicine

## 2013-03-26 ENCOUNTER — Emergency Department (HOSPITAL_COMMUNITY): Payer: Medicaid Other

## 2013-03-26 ENCOUNTER — Emergency Department (HOSPITAL_COMMUNITY)
Admission: EM | Admit: 2013-03-26 | Discharge: 2013-03-26 | Disposition: A | Discharge status: Home / Self Care | Payer: Medicaid Other | Attending: Emergency Medicine | Admitting: Emergency Medicine

## 2013-03-26 DIAGNOSIS — Z79899 Other long term (current) drug therapy: Secondary | ICD-10-CM | POA: Insufficient documentation

## 2013-03-26 DIAGNOSIS — F319 Bipolar disorder, unspecified: Secondary | ICD-10-CM | POA: Insufficient documentation

## 2013-03-26 DIAGNOSIS — F411 Generalized anxiety disorder: Secondary | ICD-10-CM | POA: Insufficient documentation

## 2013-03-26 DIAGNOSIS — Z862 Personal history of diseases of the blood and blood-forming organs and certain disorders involving the immune mechanism: Secondary | ICD-10-CM | POA: Insufficient documentation

## 2013-03-26 DIAGNOSIS — Z8619 Personal history of other infectious and parasitic diseases: Secondary | ICD-10-CM | POA: Insufficient documentation

## 2013-03-26 DIAGNOSIS — R110 Nausea: Secondary | ICD-10-CM | POA: Insufficient documentation

## 2013-03-26 DIAGNOSIS — R002 Palpitations: Secondary | ICD-10-CM | POA: Insufficient documentation

## 2013-03-26 DIAGNOSIS — M940 Chondrocostal junction syndrome [Tietze]: Secondary | ICD-10-CM | POA: Insufficient documentation

## 2013-03-26 DIAGNOSIS — Z8659 Personal history of other mental and behavioral disorders: Secondary | ICD-10-CM | POA: Insufficient documentation

## 2013-03-26 DIAGNOSIS — F17200 Nicotine dependence, unspecified, uncomplicated: Secondary | ICD-10-CM | POA: Insufficient documentation

## 2013-03-26 DIAGNOSIS — R0602 Shortness of breath: Secondary | ICD-10-CM | POA: Insufficient documentation

## 2013-03-26 NOTE — ED Notes (Signed)
Per pt chest pain that started a few weeks ago sts the pain is intermittent and located center chest. sts hurts more with movement and breathing. Denies injury or strenuous activity.

## 2013-03-26 NOTE — ED Provider Notes (Signed)
I saw and evaluated the patient, reviewed the resident's note and I agree with the findings and plan.  Pt seen and examined. Patient with reproducible chest wall pain and we discharged home  Toy Baker, MD 03/26/13 1240
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
## 2013-03-27 NOTE — ED Provider Notes (Signed)
I saw and evaluated the patient, reviewed the resident's note and I agree with the findings and plan.  Toy Baker, MD 03/27/13 2012
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
## 2013-08-16 ENCOUNTER — Emergency Department (HOSPITAL_COMMUNITY): Payer: Medicaid Other

## 2013-08-16 ENCOUNTER — Encounter (HOSPITAL_COMMUNITY): Payer: Self-pay | Admitting: Emergency Medicine

## 2013-08-16 ENCOUNTER — Emergency Department (HOSPITAL_COMMUNITY)
Admission: EM | Admit: 2013-08-16 | Discharge: 2013-08-16 | Disposition: A | Discharge status: Home / Self Care | Payer: Medicaid Other | Attending: Emergency Medicine | Admitting: Emergency Medicine

## 2013-08-16 DIAGNOSIS — F319 Bipolar disorder, unspecified: Secondary | ICD-10-CM | POA: Insufficient documentation

## 2013-08-16 DIAGNOSIS — Y929 Unspecified place or not applicable: Secondary | ICD-10-CM | POA: Insufficient documentation

## 2013-08-16 DIAGNOSIS — Z79899 Other long term (current) drug therapy: Secondary | ICD-10-CM | POA: Insufficient documentation

## 2013-08-16 DIAGNOSIS — F17200 Nicotine dependence, unspecified, uncomplicated: Secondary | ICD-10-CM | POA: Insufficient documentation

## 2013-08-16 DIAGNOSIS — S99921A Unspecified injury of right foot, initial encounter: Secondary | ICD-10-CM

## 2013-08-16 DIAGNOSIS — G8929 Other chronic pain: Secondary | ICD-10-CM | POA: Insufficient documentation

## 2013-08-16 DIAGNOSIS — Z862 Personal history of diseases of the blood and blood-forming organs and certain disorders involving the immune mechanism: Secondary | ICD-10-CM | POA: Insufficient documentation

## 2013-08-16 DIAGNOSIS — S8990XA Unspecified injury of unspecified lower leg, initial encounter: Secondary | ICD-10-CM | POA: Insufficient documentation

## 2013-08-16 DIAGNOSIS — IMO0002 Reserved for concepts with insufficient information to code with codable children: Secondary | ICD-10-CM | POA: Insufficient documentation

## 2013-08-16 DIAGNOSIS — Z8619 Personal history of other infectious and parasitic diseases: Secondary | ICD-10-CM | POA: Insufficient documentation

## 2013-08-16 DIAGNOSIS — Z87828 Personal history of other (healed) physical injury and trauma: Secondary | ICD-10-CM | POA: Insufficient documentation

## 2013-08-16 DIAGNOSIS — Y9389 Activity, other specified: Secondary | ICD-10-CM | POA: Insufficient documentation

## 2013-08-16 MED ORDER — IBUPROFEN 800 MG PO TABS: 800.0000 mg | ORAL_TABLET | Freq: Three times a day (TID) | ORAL | Status: DC

## 2013-08-16 MED ORDER — IBUPROFEN 400 MG PO TABS
800.0000 mg | ORAL_TABLET | Freq: Once | ORAL | Status: AC
  Administered 2013-08-16: 800 mg via ORAL
  Filled 2013-08-16: qty 2

## 2013-08-16 NOTE — ED Notes (Signed)
Pt reports hitting her foot on coffee table last night. Pt now reports pain has increased to top of foot and can not stand on it. CMS intact.
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
## 2013-08-16 NOTE — ED Provider Notes (Signed)
1715  Patient is getting extremely upset, hostile, and loud yelling obscenities in the ED.  She is demanding juice for her daughter for a second time.   Discussed negative XR results with Cyndia Skeeters. and will discharge the patient with a surgical boot and antiinflammatory medication.   Patient is threatening to leave and is yelling "I'm Bi-polar and it's time for my meds" and "it's med time and I'm about to lose it".  Will discharge the patient in a post-op boot and ibuprofen.    Discussed negative XR results with the patient and treatment plan, she reports she understands and agrees to follow up with her PCP.   Clabe Seal, PA-C 08/16/13 [phone]

## 2013-08-17 NOTE — ED Provider Notes (Signed)
Medical screening examination/treatment/procedure(s) were performed by non-physician practitioner and as supervising physician I was immediately available for consultation/collaboration.  Flint Melter, MD 08/17/13 [phone]

## 2013-08-17 NOTE — ED Provider Notes (Signed)
Medical screening examination/treatment/procedure(s) were performed by non-physician practitioner and as supervising physician I was immediately available for consultation/collaboration.  Flint Melter, MD 08/17/13 0005

## 2013-08-20 ENCOUNTER — Ambulatory Visit: Payer: Self-pay | Admitting: Family Medicine

## 2013-10-29 ENCOUNTER — Encounter: Payer: Self-pay | Admitting: Family Medicine

## 2013-10-29 ENCOUNTER — Ambulatory Visit (INDEPENDENT_AMBULATORY_CARE_PROVIDER_SITE_OTHER): Payer: Medicaid Other | Admitting: Family Medicine

## 2013-10-29 VITALS — BP 100/63 | HR 69 | Ht 61.0 in | Wt 100.7 lb

## 2013-10-29 DIAGNOSIS — K0889 Other specified disorders of teeth and supporting structures: Secondary | ICD-10-CM

## 2013-10-29 DIAGNOSIS — K089 Disorder of teeth and supporting structures, unspecified: Secondary | ICD-10-CM

## 2013-10-29 DIAGNOSIS — F319 Bipolar disorder, unspecified: Secondary | ICD-10-CM

## 2013-10-29 DIAGNOSIS — IMO0001 Reserved for inherently not codable concepts without codable children: Secondary | ICD-10-CM

## 2013-10-29 DIAGNOSIS — Z309 Encounter for contraceptive management, unspecified: Secondary | ICD-10-CM

## 2013-10-29 DIAGNOSIS — F603 Borderline personality disorder: Secondary | ICD-10-CM | POA: Insufficient documentation

## 2013-10-29 LAB — POCT URINE PREGNANCY: PREG TEST UR: NEGATIVE

## 2013-10-29 MED ORDER — HYDROCODONE-ACETAMINOPHEN 10-325 MG PO TABS: 1.0000 | ORAL_TABLET | Freq: Three times a day (TID) | ORAL | Status: DC | PRN

## 2013-10-29 MED ORDER — LEVONORGEST-ETH ESTRAD 91-DAY 0.15-0.03 &0.01 MG PO TABS: 1.0000 | ORAL_TABLET | Freq: Every day | ORAL | Status: DC
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
## 2013-10-29 NOTE — Assessment & Plan Note (Signed)
A: combo OCP ordered per request patient has no absolute contraindications. Advised patient quit smoking we also discussed that her trileptal may interfere with efficacy.  I anticipate we may run into prior authorization issues, if so other options include having the patient take only the active pills from 4 monthly packs, followed by one week of placebo, if this is done would recommend a lower dose estrogen containing pill 20-25 mcg of EE.

## 2013-10-29 NOTE — Assessment & Plan Note (Signed)
A: impacted wisdom teeth and caries. Pain w/o abscess. Oral surgery scheduled on 11/01/13.  P: vicodin given #30 pills for severe pain. Patient to keep scheduled appt with dental surgeon. Will CC note to Dr. Barbette MerinoJensen.

## 2013-10-29 NOTE — Patient Instructions (Signed)
Tiffany Wong,  Thank you for coming in today. I have ordered new birth control.   Please take Vicodin for severe tooth pain and post op pain. Be sure to let your oral surgeon know that I have prescribed it and the # of pills prescribed.   Please see me in f/u in 4-6 weeks to f/u how you are doing with the new birth control.  Dr. Armen PickupFunches

## 2013-12-10 ENCOUNTER — Other Ambulatory Visit (HOSPITAL_COMMUNITY)
Admission: RE | Admit: 2013-12-10 | Discharge: 2013-12-10 | Disposition: A | Discharge status: Home / Self Care | Payer: Medicaid Other | Source: Ambulatory Visit | Attending: Family Medicine | Admitting: Family Medicine

## 2013-12-10 ENCOUNTER — Encounter: Payer: Self-pay | Admitting: Family Medicine

## 2013-12-10 ENCOUNTER — Telehealth: Payer: Self-pay | Admitting: *Deleted

## 2013-12-10 ENCOUNTER — Ambulatory Visit (INDEPENDENT_AMBULATORY_CARE_PROVIDER_SITE_OTHER): Payer: Medicaid Other | Admitting: Family Medicine

## 2013-12-10 VITALS — BP 115/59 | HR 74 | Temp 99.0°F | Ht 61.0 in | Wt 100.0 lb

## 2013-12-10 DIAGNOSIS — N760 Acute vaginitis: Secondary | ICD-10-CM

## 2013-12-10 DIAGNOSIS — Z113 Encounter for screening for infections with a predominantly sexual mode of transmission: Secondary | ICD-10-CM | POA: Insufficient documentation

## 2013-12-10 DIAGNOSIS — Z309 Encounter for contraceptive management, unspecified: Secondary | ICD-10-CM

## 2013-12-10 DIAGNOSIS — N898 Other specified noninflammatory disorders of vagina: Secondary | ICD-10-CM | POA: Insufficient documentation

## 2013-12-10 LAB — POCT WET PREP (WET MOUNT): CLUE CELLS WET PREP WHIFF POC: NEGATIVE

## 2013-12-10 LAB — POCT URINE PREGNANCY: Preg Test, Ur: NEGATIVE

## 2013-12-10 MED ORDER — ESTRADIOL 1 MG PO TABS: 1.0000 mg | ORAL_TABLET | Freq: Every day | ORAL | Status: DC

## 2013-12-10 MED ORDER — METRONIDAZOLE 500 MG PO TABS: 500.0000 mg | ORAL_TABLET | Freq: Two times a day (BID) | ORAL | Status: DC

## 2013-12-10 NOTE — Telephone Encounter (Signed)
Message copied by Tanna SavoyPROPOSITO, Zenia Guest S on Mon Dec 10, 2013  3:06 PM ------      Message from: Dessa PhiFUNCHES, JOSALYN      Created: Mon Dec 10, 2013  2:35 PM       Alvino ChapelJo please inform patient:      Moderate clue cells.      Ok to treat      flagyl sent in. ------
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
## 2013-12-10 NOTE — Assessment & Plan Note (Signed)
A: compliant with OCP some vaginal dryness and decrease sex drive suggesting estrogen deficiency. P: Short course of oral estrogen.  Continue OCP.

## 2013-12-10 NOTE — Telephone Encounter (Signed)
Relayed message,patient voiced understanding. Chas Axel S  

## 2013-12-10 NOTE — Patient Instructions (Addendum)
Tiffany Wong,  Your U preg is negative. For vaginal dryness and decreased sex drive take estradiol for 5 days.  I will call with wet prep and Gc/chlam results.  Dr. Armen PickupFunches

## 2013-12-10 NOTE — Assessment & Plan Note (Signed)
Moderate clue cells. Will treat with flagyl.

## 2013-12-11 ENCOUNTER — Telehealth: Payer: Self-pay | Admitting: *Deleted

## 2013-12-11 LAB — CERVICOVAGINAL ANCILLARY ONLY
Chlamydia: NEGATIVE
NEISSERIA GONORRHEA: NEGATIVE

## 2013-12-11 NOTE — Telephone Encounter (Signed)
Message copied by Henri MedalHARTSELL, Skyanne Welle M on Tue Dec 11, 2013  4:46 PM ------      Message from: Dessa PhiFUNCHES, JOSALYN      Created: Tue Dec 11, 2013  4:35 PM       Negative GC/chlam      Please inform patient. ------

## 2013-12-11 NOTE — Telephone Encounter (Signed)
Pt is aware of lab results. Tiffany Wong,CMA

## 2013-12-28 ENCOUNTER — Telehealth: Payer: Self-pay | Admitting: Family Medicine

## 2013-12-28 MED ORDER — FLUCONAZOLE 150 MG PO TABS: 150.0000 mg | ORAL_TABLET | Freq: Once | ORAL | Status: DC

## 2013-12-28 NOTE — Telephone Encounter (Signed)
Patient placed on antibiotics a few weeks ago and has developed a yeast infection. Would like MD to send a RX for Diflucan.

## 2013-12-28 NOTE — Telephone Encounter (Signed)
Please inform patient. Sent in diflucan.

## 2013-12-28 NOTE — Telephone Encounter (Signed)
Will forward to MD. Kema Santaella,CMA  

## 2013-12-28 NOTE — Telephone Encounter (Signed)
Pt is aware of this. Deloy Archey,CMA  

## 2014-04-29 ENCOUNTER — Telehealth: Payer: Self-pay | Admitting: Family Medicine

## 2014-04-29 NOTE — Telephone Encounter (Signed)
Refill request for Levonorgestrel-Ethinyl Estradiol. Please call [phone] once completed.

## 2014-04-30 MED ORDER — LEVONORGEST-ETH ESTRAD 91-DAY 0.15-0.03 &0.01 MG PO TABS: 1.0000 | ORAL_TABLET | Freq: Every day | ORAL | Status: DC

## 2014-04-30 NOTE — Telephone Encounter (Signed)
Completed.

## 2014-05-01 NOTE — Telephone Encounter (Signed)
Tried several time to reach patient,phone has no voicemail,just busy singal.Tiffany Wong, Tiffany Wong

## 2014-06-21 ENCOUNTER — Encounter: Payer: Self-pay | Admitting: Family Medicine

## 2014-06-21 ENCOUNTER — Ambulatory Visit (INDEPENDENT_AMBULATORY_CARE_PROVIDER_SITE_OTHER): Payer: Self-pay | Admitting: Family Medicine

## 2014-06-21 VITALS — BP 102/49 | HR 77 | Temp 98.5°F | Ht 61.0 in | Wt 123.6 lb

## 2014-06-21 DIAGNOSIS — F319 Bipolar disorder, unspecified: Secondary | ICD-10-CM

## 2014-06-21 DIAGNOSIS — F3170 Bipolar disorder, currently in remission, most recent episode unspecified: Secondary | ICD-10-CM

## 2014-06-21 NOTE — Assessment & Plan Note (Signed)
I do not see any reason for medical restrictions of driving.  She is doing very well from Bipolar standpoint and although I have just met her today she has been totally stable on her currnent dosage of medicines. Has never had license restricted previously. I completed paperwork for [facility] today recommending no medical restrictions.  She completed her section and this was placed in "to be faxed" box.   FU in 1-2 months for "meet new doctor" appt
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
## 2014-08-19 ENCOUNTER — Encounter: Payer: Self-pay | Admitting: Family Medicine

## 2015-04-30 ENCOUNTER — Encounter (HOSPITAL_COMMUNITY): Payer: Self-pay | Admitting: *Deleted

## 2015-04-30 ENCOUNTER — Emergency Department (HOSPITAL_COMMUNITY): Payer: Medicaid Other

## 2015-04-30 ENCOUNTER — Emergency Department (HOSPITAL_COMMUNITY)
Admission: EM | Admit: 2015-04-30 | Discharge: 2015-04-30 | Disposition: A | Discharge status: Home / Self Care | Payer: Self-pay | Attending: Emergency Medicine | Admitting: Emergency Medicine

## 2015-04-30 DIAGNOSIS — Z720 Tobacco use: Secondary | ICD-10-CM | POA: Insufficient documentation

## 2015-04-30 DIAGNOSIS — Y9289 Other specified places as the place of occurrence of the external cause: Secondary | ICD-10-CM | POA: Insufficient documentation

## 2015-04-30 DIAGNOSIS — Y9389 Activity, other specified: Secondary | ICD-10-CM | POA: Insufficient documentation

## 2015-04-30 DIAGNOSIS — Y998 Other external cause status: Secondary | ICD-10-CM | POA: Insufficient documentation

## 2015-04-30 DIAGNOSIS — Z79899 Other long term (current) drug therapy: Secondary | ICD-10-CM | POA: Insufficient documentation

## 2015-04-30 DIAGNOSIS — W2209XA Striking against other stationary object, initial encounter: Secondary | ICD-10-CM | POA: Insufficient documentation

## 2015-04-30 DIAGNOSIS — S60221A Contusion of right hand, initial encounter: Secondary | ICD-10-CM | POA: Insufficient documentation

## 2015-04-30 DIAGNOSIS — F329 Major depressive disorder, single episode, unspecified: Secondary | ICD-10-CM | POA: Insufficient documentation

## 2015-04-30 DIAGNOSIS — Z862 Personal history of diseases of the blood and blood-forming organs and certain disorders involving the immune mechanism: Secondary | ICD-10-CM | POA: Insufficient documentation

## 2015-04-30 DIAGNOSIS — Z8619 Personal history of other infectious and parasitic diseases: Secondary | ICD-10-CM | POA: Insufficient documentation

## 2015-04-30 DIAGNOSIS — Z87828 Personal history of other (healed) physical injury and trauma: Secondary | ICD-10-CM | POA: Insufficient documentation

## 2015-04-30 MED ORDER — NAPROXEN 500 MG PO TABS: 500.0000 mg | ORAL_TABLET | Freq: Two times a day (BID) | ORAL | Status: DC

## 2015-04-30 NOTE — ED Notes (Signed)
Pt given small ice pack to place on rt hand
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
## 2015-04-30 NOTE — Discharge Instructions (Signed)
Ice your hand several times a day. Keep it elevated. Ace wrap for support. Naprosyn or Tylenol for pain. Follow-up with you doctor as needed  Hand Contusion A hand contusion is a deep bruise on your hand area. Contusions are the result of an injury that caused bleeding under the skin. The contusion may turn blue, purple, or yellow. Minor injuries will give you a painless contusion, but more severe contusions may stay painful and swollen for a few weeks. CAUSES  A contusion is usually caused by a blow, trauma, or direct force to an area of the body. SYMPTOMS   Swelling and redness of the injured area.  Discoloration of the injured area.  Tenderness and soreness of the injured area.  Pain. DIAGNOSIS  The diagnosis can be made by taking a history and performing a physical exam. An X-ray, CT scan, or MRI may be needed to determine if there were any associated injuries, such as broken bones (fractures). TREATMENT  Often, the best treatment for a hand contusion is resting, elevating, icing, and applying cold compresses to the injured area. Over-the-counter medicines may also be recommended for pain control. HOME CARE INSTRUCTIONS   Put ice on the injured area.  Put ice in a plastic bag.  Place a towel between your skin and the bag.  Leave the ice on for 15-20 minutes, 03-04 times a day.  Only take over-the-counter or prescription medicines as directed by your caregiver. Your caregiver may recommend avoiding anti-inflammatory medicines (aspirin, ibuprofen, and naproxen) for 48 hours because these medicines may increase bruising.  If told, use an elastic wrap as directed. This can help reduce swelling. You may remove the wrap for sleeping, showering, and bathing. If your fingers become numb, cold, or blue, take the wrap off and reapply it more loosely.  Elevate your hand with pillows to reduce swelling.  Avoid overusing your hand if it is painful. SEEK IMMEDIATE MEDICAL CARE IF:   You  have increased redness, swelling, or pain in your hand.  Your swelling or pain is not relieved with medicines.  You have loss of feeling in your hand or are unable to move your fingers.  Your hand turns cold or blue.  You have pain when you move your fingers.  Your hand becomes warm to the touch.  Your contusion does not improve in 2 days. MAKE SURE YOU:   Understand these instructions.  Will watch your condition.  Will get help right away if you are not doing well or get worse. Document Released: 03/26/2002 Document Revised: 06/28/2012 Document Reviewed: 03/27/2012 [facility] Patient Information 2015 [employer] [place]. This information is not intended to replace advice given to you by your health care provider. Make sure you discuss any questions you have with your health care provider.

## 2015-04-30 NOTE — ED Notes (Signed)
Pt reports punching a wall today and now has right hand pain.

## 2015-07-08 ENCOUNTER — Encounter (HOSPITAL_COMMUNITY): Payer: Self-pay

## 2015-07-08 DIAGNOSIS — R52 Pain, unspecified: Secondary | ICD-10-CM | POA: Insufficient documentation

## 2015-07-08 DIAGNOSIS — Z720 Tobacco use: Secondary | ICD-10-CM | POA: Insufficient documentation

## 2015-07-08 DIAGNOSIS — R05 Cough: Secondary | ICD-10-CM | POA: Insufficient documentation

## 2015-07-08 NOTE — ED Notes (Signed)
Onset last night sneezing, post nasal drip, productive cough- yellow phlegm, fevers, body aches.  No one in household with same symptoms.  No respiratory difficulties.

## 2015-07-08 NOTE — ED Notes (Signed)
Pt took cold and flu medication 1 hr PTA

## 2015-07-09 ENCOUNTER — Emergency Department (HOSPITAL_COMMUNITY)
Admission: EM | Admit: 2015-07-09 | Discharge: 2015-07-09 | Discharge status: Against Medical Advice | Payer: Self-pay | Attending: Emergency Medicine | Admitting: Emergency Medicine

## 2015-10-03 ENCOUNTER — Emergency Department (HOSPITAL_COMMUNITY)
Admission: EM | Admit: 2015-10-03 | Discharge: 2015-10-03 | Disposition: A | Discharge status: Home / Self Care | Payer: Self-pay | Attending: Emergency Medicine | Admitting: Emergency Medicine

## 2015-10-03 ENCOUNTER — Encounter (HOSPITAL_COMMUNITY): Payer: Self-pay | Admitting: Emergency Medicine

## 2015-10-03 DIAGNOSIS — Z79899 Other long term (current) drug therapy: Secondary | ICD-10-CM | POA: Insufficient documentation

## 2015-10-03 DIAGNOSIS — Z8619 Personal history of other infectious and parasitic diseases: Secondary | ICD-10-CM | POA: Insufficient documentation

## 2015-10-03 DIAGNOSIS — Z792 Long term (current) use of antibiotics: Secondary | ICD-10-CM | POA: Insufficient documentation

## 2015-10-03 DIAGNOSIS — Z862 Personal history of diseases of the blood and blood-forming organs and certain disorders involving the immune mechanism: Secondary | ICD-10-CM | POA: Insufficient documentation

## 2015-10-03 DIAGNOSIS — Z79818 Long term (current) use of other agents affecting estrogen receptors and estrogen levels: Secondary | ICD-10-CM | POA: Insufficient documentation

## 2015-10-03 DIAGNOSIS — Z791 Long term (current) use of non-steroidal anti-inflammatories (NSAID): Secondary | ICD-10-CM | POA: Insufficient documentation

## 2015-10-03 DIAGNOSIS — H6122 Impacted cerumen, left ear: Secondary | ICD-10-CM | POA: Insufficient documentation

## 2015-10-03 DIAGNOSIS — F17210 Nicotine dependence, cigarettes, uncomplicated: Secondary | ICD-10-CM | POA: Insufficient documentation

## 2015-10-03 DIAGNOSIS — F329 Major depressive disorder, single episode, unspecified: Secondary | ICD-10-CM | POA: Insufficient documentation

## 2015-10-03 DIAGNOSIS — Z87828 Personal history of other (healed) physical injury and trauma: Secondary | ICD-10-CM | POA: Insufficient documentation

## 2015-10-03 MED ORDER — CARBAMIDE PEROXIDE 6.5 % OT SOLN
5.0000 [drp] | Freq: Once | OTIC | Status: AC
  Administered 2015-10-03: 5 [drp] via OTIC
  Filled 2015-10-03: qty 15
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
## 2015-10-03 NOTE — Discharge Instructions (Signed)
Cerumen Impaction The structures of the external ear canal secrete a waxy substance known as cerumen. Excess cerumen can build up in the ear canal, causing a condition known as cerumen impaction. Cerumen impaction can cause ear pain and disrupt the function of the ear. The rate of cerumen production differs for each individual. In certain individuals, the configuration of the ear canal may decrease his or her ability to naturally remove cerumen. CAUSES Cerumen impaction is caused by excessive cerumen production or buildup. RISK FACTORS  Frequent use of swabs to clean ears.  Having narrow ear canals.  Having eczema.  Being dehydrated. SIGNS AND SYMPTOMS  Diminished hearing.  Ear drainage.  Ear pain.  Ear itch. TREATMENT Treatment may involve:  Over-the-counter or prescription ear drops to soften the cerumen.  Removal of cerumen by a health care provider. This may be done with:  Irrigation with warm water. This is the most common method of removal.  Ear curettes and other instruments.  Surgery. This may be done in severe cases. HOME CARE INSTRUCTIONS  Take medicines only as directed by your health care provider.  Do not insert objects into the ear with the intent of cleaning the ear. PREVENTION  Do not insert objects into the ear, even with the intent of cleaning the ear. Removing cerumen as a part of normal hygiene is not necessary, and the use of swabs in the ear canal is not recommended.  Drink enough water to keep your urine clear or pale yellow.  Control your eczema if you have it. SEEK MEDICAL CARE IF:  You develop ear pain.  You develop bleeding from the ear.  The cerumen does not clear after you use ear drops as directed.   This information is not intended to replace advice given to you by your health care provider. Make sure you discuss any questions you have with your health care provider.   Document Released: 11/11/2004 Document Revised: 10/25/2014  Document Reviewed: 05/21/2015 Elsevier Interactive Patient Education 2016 Elsevier Inc.  

## 2015-10-03 NOTE — ED Notes (Signed)
Pt co left ear fullness X3 days; pt thinks it might be a bug but no sign on inspection of ear; denies ringing but reports dizziness from malnutrition; denies start of any new medications and hasn't been on a plane or gone swimming or been around loud noises recently; pt reports cleaning and irrigating ears with no relief; also reports at night hearing is totally gone in left ear

## 2015-10-14 ENCOUNTER — Emergency Department (HOSPITAL_COMMUNITY)
Admission: EM | Admit: 2015-10-14 | Discharge: 2015-10-14 | Discharge status: Against Medical Advice | Payer: No Typology Code available for payment source | Attending: Emergency Medicine | Admitting: Emergency Medicine

## 2015-10-14 ENCOUNTER — Encounter (HOSPITAL_COMMUNITY): Payer: Self-pay | Admitting: Emergency Medicine

## 2015-10-14 DIAGNOSIS — H5712 Ocular pain, left eye: Secondary | ICD-10-CM | POA: Insufficient documentation

## 2015-10-14 DIAGNOSIS — F17210 Nicotine dependence, cigarettes, uncomplicated: Secondary | ICD-10-CM | POA: Insufficient documentation

## 2015-10-14 NOTE — ED Notes (Signed)
Bed: [id number] Expected date:  Expected time:  Means of arrival:  Comments: Psych pt in room, moving shortly

## 2015-10-14 NOTE — ED Notes (Signed)
Patient reports she woke up from a nap today and had redness to left eye. Also c/o pressure behind eye, 3/10. Redness and watering noted to same. States with watering of eye she has been having blurred vision.

## 2015-10-14 NOTE — ED Notes (Signed)
Informed by registration patient is leaving.  

## 2015-10-18 ENCOUNTER — Encounter (HOSPITAL_COMMUNITY): Payer: Self-pay | Admitting: Emergency Medicine

## 2015-10-18 ENCOUNTER — Emergency Department (HOSPITAL_COMMUNITY)
Admission: EM | Admit: 2015-10-18 | Discharge: 2015-10-19 | Disposition: A | Discharge status: Other Acute Inpatient Hospital | Payer: No Typology Code available for payment source | Attending: Emergency Medicine | Admitting: Emergency Medicine

## 2015-10-18 DIAGNOSIS — Y9289 Other specified places as the place of occurrence of the external cause: Secondary | ICD-10-CM | POA: Insufficient documentation

## 2015-10-18 DIAGNOSIS — T38892A Poisoning by other hormones and synthetic substitutes, intentional self-harm, initial encounter: Secondary | ICD-10-CM | POA: Diagnosis not present

## 2015-10-18 DIAGNOSIS — Z862 Personal history of diseases of the blood and blood-forming organs and certain disorders involving the immune mechanism: Secondary | ICD-10-CM | POA: Diagnosis not present

## 2015-10-18 DIAGNOSIS — F1210 Cannabis abuse, uncomplicated: Secondary | ICD-10-CM | POA: Insufficient documentation

## 2015-10-18 DIAGNOSIS — T50902A Poisoning by unspecified drugs, medicaments and biological substances, intentional self-harm, initial encounter: Secondary | ICD-10-CM

## 2015-10-18 DIAGNOSIS — Y998 Other external cause status: Secondary | ICD-10-CM | POA: Insufficient documentation

## 2015-10-18 DIAGNOSIS — Z8619 Personal history of other infectious and parasitic diseases: Secondary | ICD-10-CM | POA: Insufficient documentation

## 2015-10-18 DIAGNOSIS — R45851 Suicidal ideations: Secondary | ICD-10-CM | POA: Diagnosis present

## 2015-10-18 DIAGNOSIS — Y9389 Activity, other specified: Secondary | ICD-10-CM | POA: Insufficient documentation

## 2015-10-18 DIAGNOSIS — T1491XA Suicide attempt, initial encounter: Secondary | ICD-10-CM

## 2015-10-18 DIAGNOSIS — T450X2A Poisoning by antiallergic and antiemetic drugs, intentional self-harm, initial encounter: Secondary | ICD-10-CM | POA: Insufficient documentation

## 2015-10-18 DIAGNOSIS — F17210 Nicotine dependence, cigarettes, uncomplicated: Secondary | ICD-10-CM | POA: Insufficient documentation

## 2015-10-18 LAB — RAPID URINE DRUG SCREEN, HOSP PERFORMED
AMPHETAMINES: NOT DETECTED
BENZODIAZEPINES: NOT DETECTED
Barbiturates: NOT DETECTED
Cocaine: NOT DETECTED
OPIATES: NOT DETECTED
Tetrahydrocannabinol: POSITIVE — AB

## 2015-10-18 LAB — COMPREHENSIVE METABOLIC PANEL
ALBUMIN: 4.1 g/dL (ref 3.5–5.0)
ALK PHOS: 63 U/L (ref 38–126)
ALT: 11 U/L — AB (ref 14–54)
ANION GAP: 10 (ref 5–15)
AST: 19 U/L (ref 15–41)
BILIRUBIN TOTAL: 0.3 mg/dL (ref 0.3–1.2)
BUN: 9 mg/dL (ref 6–20)
CALCIUM: 8.8 mg/dL — AB (ref 8.9–10.3)
CHLORIDE: 105 mmol/L (ref 101–111)
CO2: 23 mmol/L (ref 22–32)
CREATININE: 0.57 mg/dL (ref 0.44–1.00)
GFR calc Af Amer: >60 mL/min (ref >60)
GFR calc non Af Amer: >60 mL/min (ref >60)
Glucose, Bld: 97 mg/dL (ref 65–99)
Potassium: 3.4 mmol/L — ABNORMAL LOW (ref 3.5–5.1)
SODIUM: 138 mmol/L (ref 135–145)
TOTAL PROTEIN: 7 g/dL (ref 6.5–8.1)

## 2015-10-18 LAB — CBC WITH DIFFERENTIAL/PLATELET
BASOS ABS: 0 10*3/uL (ref 0.0–0.1)
Basophils Relative: 1 %
EOS ABS: 0.1 10*3/uL (ref 0.0–0.7)
EOS PCT: 2 %
HCT: 38.6 % (ref 36.0–46.0)
Hemoglobin: 13.3 g/dL (ref 12.0–15.0)
Lymphocytes Relative: 28 %
Lymphs Abs: 1.7 10*3/uL (ref 0.7–4.0)
MCH: 29.6 pg (ref 26.0–34.0)
MCHC: 34.5 g/dL (ref 30.0–36.0)
MCV: 86 fL (ref 78.0–100.0)
MONO ABS: 0.6 10*3/uL (ref 0.1–1.0)
Monocytes Relative: 9 %
Neutro Abs: 3.5 10*3/uL (ref 1.7–7.7)
Neutrophils Relative %: 60 %
PLATELETS: 196 10*3/uL (ref 150–400)
RBC: 4.49 MIL/uL (ref 3.87–5.11)
RDW: 12.6 % (ref 11.5–15.5)
WBC: 5.9 10*3/uL (ref 4.0–10.5)

## 2015-10-18 LAB — ETHANOL

## 2015-10-18 LAB — PREGNANCY, URINE: Preg Test, Ur: NEGATIVE

## 2015-10-18 LAB — ACETAMINOPHEN LEVEL

## 2015-10-18 LAB — SALICYLATE LEVEL

## 2015-10-18 MED ORDER — ZOLPIDEM TARTRATE 5 MG PO TABS: 5.0000 mg | ORAL_TABLET | Freq: Every evening | ORAL | Status: DC | PRN

## 2015-10-18 MED ORDER — NICOTINE 21 MG/24HR TD PT24
21.0000 mg | MEDICATED_PATCH | Freq: Every day | TRANSDERMAL | Status: DC
Start: 2015-10-18 — End: 2015-10-19
  Administered 2015-10-18: 21 mg via TRANSDERMAL
  Filled 2015-10-18: qty 1

## 2015-10-18 MED ORDER — ONDANSETRON HCL 4 MG PO TABS: 4.0000 mg | ORAL_TABLET | Freq: Three times a day (TID) | ORAL | Status: DC | PRN

## 2015-10-18 MED ORDER — ALUM & MAG HYDROXIDE-SIMETH 200-200-20 MG/5ML PO SUSP: 30.0000 mL | ORAL | Status: DC | PRN

## 2015-10-18 MED ORDER — LORAZEPAM 1 MG PO TABS: 0.0000 mg | ORAL_TABLET | Freq: Four times a day (QID) | ORAL | Status: DC

## 2015-10-18 MED ORDER — LORAZEPAM 1 MG PO TABS: 0.0000 mg | ORAL_TABLET | Freq: Two times a day (BID) | ORAL | Status: DC

## 2015-10-18 NOTE — BH Assessment (Signed)
[facility]   Davina PokeJoVea Lativia Velie, LCSW Therapeutic Triage Specialist [facility] 10/18/2015 6:14 PM

## 2015-10-18 NOTE — BHH Counselor (Signed)
Spoke with [facility]P regarding poison control clearance for patient to be referred to other facilities.    Davina PokeJoVea Esme Durkin, LCSW Therapeutic Triage Specialist [facility] 10/18/2015 6:29 PM
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
## 2015-10-19 ENCOUNTER — Inpatient Hospital Stay (HOSPITAL_COMMUNITY)
Admission: AD | Admit: 2015-10-19 | Discharge: 2015-10-22 | DRG: 885 | Disposition: A | Discharge status: Home / Self Care | Payer: BLUE CROSS/BLUE SHIELD | Source: Intra-hospital | Attending: Psychiatry | Admitting: Psychiatry

## 2015-10-19 ENCOUNTER — Encounter (HOSPITAL_COMMUNITY): Payer: Self-pay | Admitting: *Deleted

## 2015-10-19 DIAGNOSIS — F603 Borderline personality disorder: Secondary | ICD-10-CM | POA: Diagnosis present

## 2015-10-19 DIAGNOSIS — F319 Bipolar disorder, unspecified: Secondary | ICD-10-CM | POA: Diagnosis present

## 2015-10-19 DIAGNOSIS — F332 Major depressive disorder, recurrent severe without psychotic features: Secondary | ICD-10-CM | POA: Diagnosis not present

## 2015-10-19 DIAGNOSIS — Z91410 Personal history of adult physical and sexual abuse: Secondary | ICD-10-CM

## 2015-10-19 DIAGNOSIS — G4700 Insomnia, unspecified: Secondary | ICD-10-CM | POA: Diagnosis present

## 2015-10-19 DIAGNOSIS — F17210 Nicotine dependence, cigarettes, uncomplicated: Secondary | ICD-10-CM | POA: Diagnosis present

## 2015-10-19 DIAGNOSIS — F411 Generalized anxiety disorder: Secondary | ICD-10-CM | POA: Diagnosis present

## 2015-10-19 DIAGNOSIS — Z8249 Family history of ischemic heart disease and other diseases of the circulatory system: Secondary | ICD-10-CM | POA: Diagnosis not present

## 2015-10-19 DIAGNOSIS — F3132 Bipolar disorder, current episode depressed, moderate: Secondary | ICD-10-CM | POA: Diagnosis not present

## 2015-10-19 MED ORDER — ZOLPIDEM TARTRATE 5 MG PO TABS: 5.0000 mg | ORAL_TABLET | Freq: Every evening | ORAL | Status: DC | PRN

## 2015-10-19 MED ORDER — ALUM & MAG HYDROXIDE-SIMETH 200-200-20 MG/5ML PO SUSP: 30.0000 mL | ORAL | Status: DC | PRN

## 2015-10-19 MED ORDER — LORAZEPAM 1 MG PO TABS
0.0000 mg | ORAL_TABLET | Freq: Two times a day (BID) | ORAL | Status: DC
  Administered 2015-10-21: 1 mg via ORAL
  Filled 2015-10-19 (×2): qty 1

## 2015-10-19 MED ORDER — LORAZEPAM 1 MG PO TABS: 0.0000 mg | ORAL_TABLET | Freq: Four times a day (QID) | ORAL | Status: AC

## 2015-10-19 MED ORDER — BUSPIRONE HCL 5 MG PO TABS
5.0000 mg | ORAL_TABLET | Freq: Two times a day (BID) | ORAL | Status: DC
  Administered 2015-10-19 – 2015-10-22 (×6): 5 mg via ORAL
  Filled 2015-10-19: qty 1
  Filled 2015-10-19: qty 14
  Filled 2015-10-19 (×4): qty 1
  Filled 2015-10-19: qty 14
  Filled 2015-10-19 (×4): qty 1

## 2015-10-19 MED ORDER — ZOLPIDEM TARTRATE 10 MG PO TABS
10.0000 mg | ORAL_TABLET | Freq: Every evening | ORAL | Status: DC | PRN
  Administered 2015-10-19 – 2015-10-21 (×3): 10 mg via ORAL
  Filled 2015-10-19 (×3): qty 1

## 2015-10-19 MED ORDER — LAMOTRIGINE 25 MG PO TABS
25.0000 mg | ORAL_TABLET | Freq: Every day | ORAL | Status: DC
  Administered 2015-10-19 – 2015-10-22 (×4): 25 mg via ORAL
  Filled 2015-10-19 (×2): qty 1
  Filled 2015-10-19: qty 7
  Filled 2015-10-19 (×4): qty 1

## 2015-10-19 MED ORDER — NICOTINE 21 MG/24HR TD PT24
21.0000 mg | MEDICATED_PATCH | Freq: Every day | TRANSDERMAL | Status: DC
Start: 2015-10-19 — End: 2015-10-22
  Administered 2015-10-19 – 2015-10-22 (×4): 21 mg via TRANSDERMAL
  Filled 2015-10-19 (×6): qty 1

## 2015-10-19 NOTE — Tx Team (Signed)
Initial Interdisciplinary Treatment Plan   PATIENT STRESSORS: Financial difficulties Marital or family conflict   PATIENT STRENGTHS: Ability for insight Average or above average intelligence General fund of knowledge   PROBLEM LIST: Problem List/Patient Goals Date to be addressed Date deferred Reason deferred Estimated date of resolution  "just needed sleep" 10/19/15           Depression 10/19/15                                          DISCHARGE CRITERIA:  Improved stabilization in mood, thinking, and/or behavior Need for constant or close observation no longer present  PRELIMINARY DISCHARGE PLAN: Return to previous living arrangement  PATIENT/FAMIILY INVOLVEMENT: This treatment plan has been presented to and reviewed with the patient, Tiffany Wong, and/or family member.  The patient and family have been given the opportunity to ask questions and make suggestions.  Jacquelyne BalintForrest, Akacia Boltz Shanta 10/19/2015, 12:36 PM

## 2015-10-19 NOTE — ED Notes (Signed)
Per charge nurse in ED Dr. Hyacinth MeekerMiller cleared pt. Yesterday saying that there was no need to call poison control.

## 2015-10-19 NOTE — Progress Notes (Signed)
10:31am. Dr. Fredderick PhenixBelfi places pt under IVC. Per Salomon MastMagistrate Richardson, IVC paperwork approved and law enforcement to be dispatched directly for service of papers and [facility].  York SpanielAlexandra Daril Warga [facility] Clinical Social Worker [facility] phone: [phone]

## 2015-10-19 NOTE — ED Notes (Signed)
Pt is very irritable this am, states she does not want to be admitted to inpatient.She is hyperverbal and states that she will not be compliant when discharged.  15 minute checks and video monitoring continue.
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
## 2015-10-19 NOTE — Progress Notes (Signed)
[facility], room 402-1 once medically cleared by poison control and ED physician. Rosey BathKelly Aylah Yeary, RN
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
## 2015-10-19 NOTE — [facility] Group Notes (Signed)
[facility] Group Notes:  (Clinical Social Work)   10/19/2015    1:15-2:15PM  Summary of Progress/Problems:   The main focus of today's process group was to   1)  Discuss any resolutions or goals for the new year  2)  Identify current unhealthy supports and discuss how to set limits with them  3)  Discuss the importance of adding supports  An emphasis was placed on using counselor, doctor, therapy groups, and problem-specific support groups to expand supports.    The patient expressed full comprehension of the concepts presented, and agreed that there is a need to add more supports.  The patient stated her plan for the new year is to "stay true to myself."  She displayed significant insight and tended toward monopolizing the group, although with appropriate statements.  Type of Therapy:  Process Group with Motivational Interviewing  Participation Level:  Active  Participation Quality:  Attentive, Monopolizing, Sharing and Supportive  Affect:  Excited  Cognitive:  Oriented  Insight:  Engaged  Engagement in Therapy:  Engaged  Modes of Intervention:   Education, Support and Processing, Activity  Ambrose MantleMareida Grossman-Orr, LCSW 10/19/2015    3:19 PM

## 2015-10-19 NOTE — ED Provider Notes (Signed)
Filed Vitals:   10/19/15 0629 10/19/15 0637  BP: 102/69 102/69  Pulse: 72 72  Temp:  97.5 F (36.4 C)  Resp:  16   Pt evaluated by me.  Is alert and oriented.  No confusion.  No drowsiness.  Vitals stable.  Is medically cleared from benadryl ingestion that occurred yesterday afternoon.  Pt is very agitated and wanting to leave.  Says that she does [facility]  Will do IVC given that she had an intentional overdose.  Rolan BuccoMelanie Jalysa Swopes, MD 10/19/15 [phone]

## 2015-10-19 NOTE — [facility] Suicide Risk Assessment (Signed)
[facility]   Nursing information obtained from:  Patient Demographic factors:  Low socioeconomic status Current Mental Status:  NA Loss Factors:  NA Historical Factors:  NA Risk Reduction Factors:  NA Total Time spent with patient: 45 minutes Principal Problem: Bipolar affective disorder (HCC) Diagnosis:   Patient Active Problem List   Diagnosis Date Noted  . MDD (major depressive disorder), recurrent episode, severe (HCC) [F33.2] 10/19/2015  . Vaginal discharge [N89.8] 12/10/2013  . Contraception management [Z30.9] 10/29/2013  . Tooth pain [K08.89] 10/29/2013  . Borderline personality disorder [F60.3] 10/29/2013  . Tobacco abuse [Z72.0] 01/02/2012  . Genital herpes [054.1] 09/27/2011  . Bipolar affective disorder (HCC) [F31.9] 05/17/2011     Continued Clinical Symptoms:  Alcohol Use Disorder Identification Test Final Score (AUDIT): 0 The "Alcohol Use Disorders Identification Test", Guidelines for Use in Primary Care, Second Edition.  World Science writerHealth Organization [facility](WHO). Score between 0-7:  no or low risk or alcohol related problems. Score between 8-15:  moderate risk of alcohol related problems. Score between 16-19:  high risk of alcohol related problems. Score 20 or above:  warrants further diagnostic evaluation for alcohol dependence and treatment.   CLINICAL FACTORS:   Bipolar Disorder:   Depressive phase   Psychiatric Specialty Exam: Physical Exam  ROS  Blood pressure 96/66, pulse 72, temperature 98.4 F (36.9 C), temperature source Oral, resp. rate 18, height 5\' 1"  (1.549 m), weight 46.267 kg (102 lb), last menstrual period 09/21/2015.Body mass index is 19.28 kg/(m^2).    COGNITIVE FEATURES THAT CONTRIBUTE TO RISK:  Closed-mindedness, Polarized thinking and Thought constriction (tunnel vision)    SUICIDE RISK:   Mild:  Suicidal ideation of limited frequency, intensity, duration, and specificity.  There are no identifiable plans, no  associated intent, mild dysphoria and related symptoms, good self-control (both objective and subjective assessment), few other risk factors, and identifiable protective factors, including available and accessible social support.  PLAN OF CARE: see admission H and P  Medical Decision Making:  Review of Psycho-Social Stressors (1), Review or order clinical lab tests (1), Review of Medication Regimen & Side Effects (2) and Review of New Medication or Change in Dosage (2)  I certify that inpatient services furnished can reasonably be expected to improve the patient's condition.   Copeland Neisen A 10/19/2015, 8:16 PM

## 2015-10-19 NOTE — ED Notes (Signed)
Pt discharged ambulatory with [facility].  All belongings were returned to pt.  Report called to Jacobs EngineeringKim rn.

## 2015-10-20 DIAGNOSIS — F3132 Bipolar disorder, current episode depressed, moderate: Secondary | ICD-10-CM

## 2015-10-20 MED ORDER — ENSURE ENLIVE PO LIQD
237.0000 mL | Freq: Three times a day (TID) | ORAL | Status: DC
  Administered 2015-10-20 – 2015-10-22 (×4): 237 mL via ORAL

## 2015-10-20 NOTE — Plan of Care (Signed)
Problem: Diagnosis: Increased Risk For Suicide Attempt Goal: STG-Patient Will Attend All Groups On The Unit Outcome: Progressing Patient attended evening group and participated this evening.

## 2015-10-20 NOTE — Progress Notes (Signed)
D: Patient is up in the milieu.  She is talkative and assertive.  She is sleeping fair; her appetite is poor; concentration good and energy level is normal.  She report decreased depressive symptoms, rating her depression as a 1.  She denies any hopelessness and rates her anxiety as a 7.  Her goal today is to "discover new ways to look at things and staying actively engaged throughout the day."  She is attending groups and participating in her treatment.  Patient states that husband is not supportive of her illness.  She denies SI/HI/AVH. A: Continue to monitor medication management and MD orders.  Safety checks completed every 15 minutes per protocol.  Offer support and encouragement as needed. R: Patient is receptive to staff; her behavior is appropriate.

## 2015-10-20 NOTE — Tx Team (Addendum)
Interdisciplinary Treatment Plan Update (Adult) Date: 10/20/2015    Time Reviewed: 9:30 AM  Progress in Treatment: Attending groups: Yes Participating in groups: Yes Taking medication as prescribed: Yes Tolerating medication: Yes Family/Significant other contact made: No, CSW has attempted to contact husband Patient understands diagnosis: Yes Discussing patient identified problems/goals with staff: Yes Medical problems stabilized or resolved: Yes Denies suicidal/homicidal ideation: Yes Issues/concerns per patient self-inventory: Yes Other:  New problem(s) identified: N/A  Discharge Plan or Barriers: Home with outpatient services at [facility].  Reason for Continuation of Hospitalization:  Depression Anxiety Medication Stabilization   Comments: N/A  Estimated length of stay: Discharge anticipated for 10/22/15   Patient is a 25 year old female admitted for SI and overdose on sleeping pills prompted by financial stressors. Patient lives in [place] and follows up with [place]. Last [facility] admission in 2014. Patient will benefit from crisis stabilization, medication evaluation, group therapy, and psycho education in addition to case management for discharge planning. Patient and CSW reviewed pt's identified goals and treatment plan. Pt verbalized understanding and agreed to treatment plan.     Review of initial/current patient goals per problem list:  1. Goal(s): Patient will participate in aftercare plan   Met: Yes   Target date: 3-5 days post admission date   As evidenced by: Patient will participate within aftercare plan AEB aftercare provider and housing plan at discharge being identified.  1/2: Goal met. Patient plans to return home to follow up with outpatient services at [facility].   2. Goal (s): Patient will exhibit decreased depressive symptoms and suicidal ideations.   Met: Yes   Target date: 3-5 days post admission date   As evidenced by: Patient will utilize self  rating of depression at 3 or below and demonstrate decreased signs of depression or be deemed stable for discharge by MD.   1/2: Goal progressing. Patient reports low levels of depression today, denies SI.  1/4: Goal met. Patient rates depression at 0, denies SI.   3. Goal(s): Patient will demonstrate decreased signs and symptoms of anxiety.   Met: Adequate for discharge per MD.   Target date: 3-5 days post admission date   As evidenced by: Patient will utilize self rating of anxiety at 3 or below and demonstrated decreased signs of anxiety, or be deemed stable for discharge by MD   1/2: Goal progressing. Patient reports low levels of anxiety today.  1/4: Adequate for discharge. Patient rates anxiety at baseline level of 6/7.     Attendees: Patient:    Family:    Physician: Dr. Shea Evans, Dr. Sabra Heck  10/20/2015 9:30 AM  Nursing: Marcella Dubs, [facility], [place], RN 10/20/2015 9:30 AM  Clinical Social Worker: Tilden Fossa, [place] 10/20/2015 9:30 AM  Other: Peri Maris, LCSW; [facility], LCSW  10/20/2015 9:30 AM   10/20/2015 9:30 AM  Other: Lars Pinks, Case Manager 10/20/2015 9:30 AM  Other: Agustina Caroli, NP 10/20/2015 9:30 AM  Other:           Scribe for Treatment Team:  Tilden Fossa, MSW, [place] [phone]

## 2015-10-20 NOTE — Plan of Care (Signed)
Problem: Ineffective individual coping Goal: STG: Patient will remain free from self harm Outcome: Progressing Patient denies SI today; she remains free from self harm.

## 2015-10-20 NOTE — Progress Notes (Signed)
Adult Psychoeducational Group Note  Date:  10/20/2015 Time:  8:20 PM  Group Topic/Focus:  Wrap-Up Group:   The focus of this group is to help patients review their daily goal of treatment and discuss progress on daily workbooks.  Participation Level:  Active  Participation Quality:  Appropriate  Affect:  Appropriate  Cognitive:  Appropriate  Insight: Good  Engagement in Group:  Engaged  Modes of Intervention:  Discussion  Additional Comments:  Pt rated overall day "a high 9" out of 10 because she was able to complete her goal for the day, which was to "secure 2 more tools for support systems" and she was able to develop a structured treatment plan. Pt noted that lunch with other patients on the unit was the highlight of her day.   Tiffany NeerJasmine S Cyree Chuong 10/20/2015, 9:03 PM

## 2015-10-20 NOTE — Progress Notes (Signed)
Recreation Therapy Notes  Date: 01.02.2017 Time: 9:30am Location: 300 Group Room   Group Topic: Stress Management  Goal Area(s) Addresses:  Patient will actively participate in stress management techniques presented during session.   Behavioral Response: Did not attend.   Marykay Lexenise L Velva Molinari, LRT/CTRS        Emmy Keng L 10/20/2015 10:20 AM
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
## 2015-10-20 NOTE — Progress Notes (Signed)
D: Pt denies SI/HI/AVH. Pt is pleasant and cooperative. Pt stated she was doing better, she's getting the tools to help her on the outside and the medications are helping her a lot.  A: Pt was offered support and encouragement. Pt was given scheduled medications. Pt was encourage to attend groups. Q 15 minute checks were done for safety.   R:Pt attends groups and interacts well with peers and staff. Pt is taking medication. Pt has no complaints at this time.Pt receptive to treatment and safety maintained on unit.

## 2015-10-20 NOTE — BHH Group Notes (Signed)
BHH LCSW Group Therapy 10/20/2015  1:15 PM   Type of Therapy: Group Therapy  Participation Level: Did Not Attend. Patient invited to participate but declined.   Lauri Purdum, MSW, LCSWA Clinical Social Worker [facility] [phone]   

## 2015-10-20 NOTE — B[facility] Group Notes (Signed)
   [facility]  10/20/2015  8:45 AM   Participation Quality: Alert, Appropriate and Oriented  Mood/Affect: Appropriate  Depression Rating: Reports low levels of depression  Anxiety Rating: Reports low levels of anxiety  Thoughts of Suicide: Pt denies SI/HI  Will you contract for safety? Yes  Current AVH: Pt denies  Plan for Discharge/Comments: Pt attended discharge planning group and actively participated in group. CSW provided pt with today's workbook. Patient complaining of having racing thoughts. She reports feeling "good" today but had difficulty sleeping last night. She plans to return home to [facility]  Transportation Means: Pt reports access to transportation  Supports: No supports mentioned at this time  Samuella BruinKristin Mahdiya Mossberg, MSW, [employer]CS[employer] Clinical Social Worker [facility] [phone]

## 2015-10-20 NOTE — Progress Notes (Signed)
Patient up in the dayroom laughing and talking with peers and can be loud at times but re-direct able. She was upset earlier after visiting with her husband who was not supportive but managed to calm herself down after talking about it with Clinical research associatewriter and mht present. She denies si/hi/a/v hallucinations. Support given and safety maintained on the unit with 15 min checks.
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
## 2015-10-21 LAB — TSH: TSH: 0.555 u[IU]/mL (ref 0.350–4.500)

## 2015-10-21 NOTE — Progress Notes (Signed)
Recreation Therapy Notes  Animal-Assisted Activity (AAA) Program Checklist/Progress Notes Patient Eligibility Criteria Checklist & Daily Group note for Rec Tx Intervention  Date: 01.03.2017 Time: 2:45pm [facility] [address] [facility] [facility]    AAA/T Program Assumption of Risk Form signed by Patient/ or Parent Legal Guardian yes  Patient is free of allergies or sever asthma yes  Patient reports no fear of animals yes  Patient reports no history of cruelty to animals yes  Patient understands his/her participation is voluntary yes  Patient washes hands before animal contact yes  Patient washes hands after animal contact yes  Behavioral Response: Appropriate   Education: Hand Washing, Appropriate Animal Interaction   Education Outcome: Acknowledges education.   Clinical Observations/Feedback: Patient engaged appropriately with therapy dog and peers during session. Additionally patient asked appropriate questions about therapy dog and his training.   Marykay Lexenise L Casper Pagliuca, LRT/CTRS  Peytan Andringa L 10/21/2015 2:04 PM

## 2015-10-21 NOTE — Plan of Care (Signed)
Problem: Ineffective individual coping Goal: LTG: Patient will report a decrease in negative feelings Outcome: Progressing Pt stated she was feeling much better

## 2015-10-21 NOTE — BHH Group Notes (Signed)

## 2015-10-21 NOTE — Progress Notes (Signed)
D:Patient in the dayroom on approach.  Patient states she ahd a good day.  Patient is hyperverbal and appears anxious.  Patient states she had a good day.  Patient states her family was able to visited her.  Patient spoke about new people being on the unit today and the vibes changed.  Patient states, "the depression is thick in the air."  Patient states she feels that the [address] hall has created a family and the new people on the hall are messing that up.  Patient states her goal for today was to be confident.  Patient states she achieved her goal.  Patient denies SI/HI and denies AVH.  A: Staff to monitor Q 15 mins for safety.  Encouragement and support offered.  No Scheduled medications administered per orders.  Ambien administered prn for sleep. R: Patient remains safe on the unit.  Patient attended group tonight.  Patient visible on the unit and interacting with peers.  Patient taking administered medications.

## 2015-10-21 NOTE — [facility] Suicide Risk Assessment (Signed)
[facility] INPATIENT:  Family/Significant Other Suicide Prevention Education  Suicide Prevention Education:  Contact Attempts: Darl HouseholderQuentin Elder (husband) [phone], (name of family member/significant other) has been identified by the patient as the family member/significant other with whom the patient will be residing, and identified as the person(s) who will aid the patient in the event of a mental health crisis.  With written consent from the patient, two attempts were made to provide suicide prevention education, prior to and/or following the patient's discharge.  We were unsuccessful in providing suicide prevention education.  A suicide education pamphlet was given to the patient to share with family/significant other.  Date and time of first attempt: 10/21/15 at 12:50pm Date and time of second attempt: 10/22/15 at 10:15am  Bruno Leach, [place] CarboKristin L 10/21/2015, 12:50 PM

## 2015-10-21 NOTE — Plan of Care (Signed)
Problem: Consults Goal: Suicide Risk Patient Education (See Patient Education module for education specifics)  Outcome: Progressing Nurse discussed suicidal thoughts/ depression/ coping skills with patient.        

## 2015-10-21 NOTE — Progress Notes (Signed)
Adult Psychoeducational Group Note  Date:  10/21/2015 Time:  9:50 PM  Group Topic/Focus:  Wrap-Up Group:   The focus of this group is to help patients review their daily goal of treatment and discuss progress on daily workbooks.  Participation Level:  Active  Participation Quality:  Appropriate  Affect:  Appropriate  Cognitive:  Alert  Insight: Appropriate  Engagement in Group:  Engaged  Modes of Intervention:  Discussion  Additional Comments:  Patient stated having an okay day. Patient rated her day as a 9. Patient stated everyday on the 400 hall is a positive day for her.   Kutter Schnepf L Faythe Heitzenrater 10/21/2015, 9:50 PM
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
## 2015-10-22 DIAGNOSIS — F3132 Bipolar disorder, current episode depressed, moderate: Secondary | ICD-10-CM | POA: Insufficient documentation

## 2015-10-22 MED ORDER — ZOLPIDEM TARTRATE 10 MG PO TABS: 10.0000 mg | ORAL_TABLET | Freq: Every evening | ORAL | Status: DC | PRN

## 2015-10-22 MED ORDER — NICOTINE 21 MG/24HR TD PT24: 21.0000 mg | MEDICATED_PATCH | Freq: Every day | TRANSDERMAL | Status: DC

## 2015-10-22 MED ORDER — LAMOTRIGINE 25 MG PO TABS: 25.0000 mg | ORAL_TABLET | Freq: Every day | ORAL | Status: DC

## 2015-10-22 MED ORDER — BUSPIRONE HCL 5 MG PO TABS: 5.0000 mg | ORAL_TABLET | Freq: Two times a day (BID) | ORAL | Status: DC

## 2015-10-22 NOTE — Progress Notes (Signed)
Discharge note: Pt received both written and verbal discharge instructions. Pt verbalized understanding of discharge instructions. Pt agreed to f/u appt and med regimen. Pt received sample meds, prescriptions and belongings. Pt safely left.

## 2015-10-22 NOTE — Progress Notes (Signed)
Recreation Therapy Notes  Date: 01.04.2017 Time: 9:30am Location: 300 Hall Group Room   Group Topic: Stress Management  Goal Area(s) Addresses:  Patient will actively participate in stress management techniques presented during session.   Behavioral Response: Appropriate   Intervention: Stress management techniques  Activity :  Deep Breathing and Guided Imagery. LRT provided instruction and demonstration on practice of Guided Imagery. Technique was coupled with deep breathing.   Education:  Stress Management, Discharge Planning.   Education Outcome: Acknowledges education  Clinical Observations/Feedback: Patient actively engaged in technique introduced, expressed no concerns and demonstrated ability to practice independently post d/c.   Reznor Ferrando L Rosemaria Inabinet, LRT/CTRS        Abby Stines L 10/22/2015 12:53 PM 

## 2015-10-22 NOTE — [facility] Group Notes (Signed)
   [facility]  10/22/2015  8:45 AM   Participation Quality: Alert, Appropriate and Oriented  Mood/Affect: Appropriate, hyperverbal  Depression Rating: 0  Anxiety Rating: 6-7  Thoughts of Suicide: Pt denies SI/HI  Will you contract for safety? Yes  Current AVH: Pt denies  Plan for Discharge/Comments: Pt attended discharge planning group and actively participated in group. CSW provided pt with today's workbook. Patient reports feeling "really good" today despite having anxiety and irritability last night. She plans to return home [facility]   Transportation Means: Pt reports access to transportation  Supports: No supports mentioned at this time  Samuella BruinKristin Shanera Meske, MSW, [employer]CSWA Clinical Social Worker [facility] [phone]

## 2015-10-22 NOTE — [facility] Suicide Risk Assessment (Signed)
[facility]   Demographic Factors:  25 year old married female, currently unemployed, has 26 year old son  Total Time spent with patient: 30 minutes  Musculoskeletal: Strength & Muscle Tone: within normal limits Gait & Station: normal Patient leans: N/A  Psychiatric Specialty Exam: Physical Exam  ROS  Blood pressure 121/63, pulse 95, temperature 98.4 F (36.9 C), temperature source Oral, resp. rate 16, height 5\' 1"  (1.549 m), weight 102 lb (46.267 kg), last menstrual period 09/21/2015.Body mass index is 19.28 kg/(m^2).  General Appearance: Well Groomed  Patent attorneyye Contact::  Good  Speech:  less hyperverbal   Volume:  Normal  Mood:  euthymic   Affect:  Appropriate- less expansive   Thought Process:  Linear  Orientation:  Full (Time, Place, and Person)  Thought Content:  no hallucinations, no delusions   Suicidal Thoughts:  No denies any suicidal ideations, denies any self injurious ideations   Homicidal Thoughts:  No  Memory:  recent and remote grossly intact   Judgement:  Other:  improved   Insight:  improved   Psychomotor Activity:  Normal  Concentration:  Good  Recall:  Good  Fund of Knowledge:Good  Language: Good  Akathisia:  Negative  Handed:  Right  AIMS (if indicated):     Assets:  Communication Skills Desire for Improvement Social Support  Sleep:  Number of Hours: 4.5  Cognition: WNL  ADL's:  Intact   Have you used any form of tobacco in the last 30 days? (Cigarettes, Smokeless Tobacco, Cigars, and/or Pipes): Patient Refused Screening  Has this patient used any form of tobacco in the last 30 days? (Cigarettes, Smokeless Tobacco, Cigars, and/or Pipes) Yes, A prescription for an FDA-approved tobacco cessation medication was offered at discharge and the patient refused  Mental Status Per Nursing Assessment::   On Admission:  NA  Current Mental Status by Physician: At this time patient improved compared to admission- At this time improved  mood , affect, states mood is "OK", denies depression, affect appropriate, not expansive or irritable at this time, talkative but not pressured at this time, no SI or HI, no psychotic symptoms, 0x 3 .   Loss Factors: Financial stressors   Historical Factors: Has been diagnosed with Bipolar Disorder in the past ,  Prior psychiatric admissions .   Risk Reduction Factors:   Responsible for children under 26 years of age, Sense of responsibility to family, Living with another person, especially a relative and Positive social support  Continued Clinical Symptoms:  As above, currently improved compared to admission. No medication side effects reported   Cognitive Features That Contribute To Risk:  No gross cognitive deficits noted upon discharge. Is alert , attentive, and oriented x 3   Suicide Risk:  Mild:  Suicidal ideation of limited frequency, intensity, duration, and specificity.  There are no identifiable plans, no associated intent, mild dysphoria and related symptoms, good self-control (both objective and subjective assessment), few other risk factors, and identifiable protective factors, including available and accessible social support.  Principal Problem: Bipolar affective disorder [facility](HCC) Discharge Diagnoses:  Patient Active Problem List   Diagnosis Date Noted  . MDD (major depressive disorder), recurrent episode, severe ([facility]) [F33.2] 10/19/2015  . Vaginal discharge [N89.8] 12/10/2013  . Contraception management [Z30.9] 10/29/2013  . Tooth pain [K08.89] 10/29/2013  . Borderline personality disorder [F60.3] 10/29/2013  . Tobacco abuse [Z72.0] 01/02/2012  . Genital herpes [054.1] 09/27/2011  . Bipolar affective disorder (HCC) [F31.9] 05/17/2011    Follow-up Information  [facility]   Specialty:  Professional Counselor   Why:  Therapy appointment on Friday Jan. 6th at 2pm with Erin Cooley-Gross. Medication management appointment on  Tuesday Jan. 9th at 10:30am with Valinda Hoar.    Contact information:   [facility] [place] [address] [place] [address] [phone]       Plan Of Care/Follow-up recommendations:  Activity:  as tolerated  Diet:  Regular  Tests:  NA Other:  See below   Is patient on multiple antipsychotic therapies at discharge:  No   Has Patient had three or more failed trials of antipsychotic monotherapy by history:  No  Recommended Plan for Multiple Antipsychotic Therapies: NA  At this time patient requesting discharge , leaving unit in good spirits. Plans to return home .  Follow up as above  Hannah Crill 10/22/2015, 11:35 AM
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
## 2015-10-22 NOTE — Progress Notes (Signed)
  [facility] :  Will you be returning to the same living situation after discharge:  Yes,  Patient plans to return home At discharge, do you have transportation home?: Yes, patient reports access to transportation Do you have the ability to pay for your medications: Yes,  patient will be provided with prescriptions at discharge  Release of information consent forms completed and in the chart;  Patient's signature needed at discharge.  Patient to Follow up at: Follow-up Information    [facility]   Specialty:  Professional Counselor   Why:  Therapy appointment on Friday Jan. 6th at 2pm with Erin Cooley-Gross. Medication management appointment on Tuesday Jan. 9th at 10:30am with Valinda HoarMeredith Baker.    Contact information:   [facility] [address] [place] [address] [phone]       Next level of care provider has access to [facility] Link:no  Safety Planning and Suicide Prevention discussed: Yes,  with patient  Have you used any form of tobacco in the last 30 days? (Cigarettes, Smokeless Tobacco, Cigars, and/or Pipes): Patient Refused Screening  Has patient been referred to the Quitline?: Patient refused referral  Patient has been referred for addiction treatment: N/A  Tiffany Wong, [place] CarboKristin L 10/22/2015, 10:20 AM

## 2018-06-09 ENCOUNTER — Ambulatory Visit (HOSPITAL_COMMUNITY)
Admission: EM | Admit: 2018-06-09 | Discharge: 2018-06-09 | Disposition: A | Discharge status: Home / Self Care | Payer: Medicaid Other | Attending: Emergency Medicine | Admitting: Emergency Medicine

## 2018-06-09 ENCOUNTER — Encounter (HOSPITAL_COMMUNITY): Payer: Self-pay | Admitting: *Deleted

## 2018-06-09 DIAGNOSIS — Z8379 Family history of other diseases of the digestive system: Secondary | ICD-10-CM | POA: Insufficient documentation

## 2018-06-09 DIAGNOSIS — Z8619 Personal history of other infectious and parasitic diseases: Secondary | ICD-10-CM | POA: Insufficient documentation

## 2018-06-09 DIAGNOSIS — Z3202 Encounter for pregnancy test, result negative: Secondary | ICD-10-CM

## 2018-06-09 DIAGNOSIS — Z818 Family history of other mental and behavioral disorders: Secondary | ICD-10-CM | POA: Insufficient documentation

## 2018-06-09 DIAGNOSIS — R319 Hematuria, unspecified: Secondary | ICD-10-CM | POA: Diagnosis not present

## 2018-06-09 DIAGNOSIS — Z886 Allergy status to analgesic agent status: Secondary | ICD-10-CM | POA: Diagnosis not present

## 2018-06-09 DIAGNOSIS — Z813 Family history of other psychoactive substance abuse and dependence: Secondary | ICD-10-CM | POA: Insufficient documentation

## 2018-06-09 DIAGNOSIS — N390 Urinary tract infection, site not specified: Secondary | ICD-10-CM | POA: Diagnosis not present

## 2018-06-09 DIAGNOSIS — Z79899 Other long term (current) drug therapy: Secondary | ICD-10-CM | POA: Diagnosis not present

## 2018-06-09 DIAGNOSIS — F17210 Nicotine dependence, cigarettes, uncomplicated: Secondary | ICD-10-CM | POA: Insufficient documentation

## 2018-06-09 DIAGNOSIS — Z87440 Personal history of urinary (tract) infections: Secondary | ICD-10-CM | POA: Insufficient documentation

## 2018-06-09 DIAGNOSIS — Z113 Encounter for screening for infections with a predominantly sexual mode of transmission: Secondary | ICD-10-CM

## 2018-06-09 DIAGNOSIS — F329 Major depressive disorder, single episode, unspecified: Secondary | ICD-10-CM | POA: Insufficient documentation

## 2018-06-09 DIAGNOSIS — R3915 Urgency of urination: Secondary | ICD-10-CM | POA: Diagnosis present

## 2018-06-09 LAB — POCT URINALYSIS DIP (DEVICE)
Bilirubin Urine: NEGATIVE
GLUCOSE, UA: NEGATIVE mg/dL
Ketones, ur: NEGATIVE mg/dL
NITRITE: POSITIVE — AB
Protein, ur: 30 mg/dL — AB
Specific Gravity, Urine: 1.02 (ref 1.005–1.030)
UROBILINOGEN UA: 1 mg/dL (ref 0.0–1.0)
pH: 6 (ref 5.0–8.0)

## 2018-06-09 LAB — POCT PREGNANCY, URINE: PREG TEST UR: NEGATIVE

## 2018-06-09 MED ORDER — CEFTRIAXONE SODIUM 1 G IJ SOLR
INTRAMUSCULAR | Status: AC
  Filled 2018-06-09: qty 10

## 2018-06-09 MED ORDER — LIDOCAINE HCL (PF) 1 % IJ SOLN
INTRAMUSCULAR | Status: AC
  Filled 2018-06-09: qty 4

## 2018-06-09 MED ORDER — CEFTRIAXONE SODIUM 1 G IJ SOLR
1.0000 g | Freq: Once | INTRAMUSCULAR | Status: AC
  Administered 2018-06-09: 1 g via INTRAMUSCULAR

## 2018-06-09 MED ORDER — AZITHROMYCIN 250 MG PO TABS
1000.0000 mg | ORAL_TABLET | Freq: Once | ORAL | Status: AC
  Administered 2018-06-09: 1000 mg via ORAL

## 2018-06-09 MED ORDER — IBUPROFEN 600 MG PO TABS: 600.0000 mg | ORAL_TABLET | Freq: Four times a day (QID) | ORAL | 0 refills | Status: DC | PRN

## 2018-06-09 MED ORDER — AZITHROMYCIN 250 MG PO TABS
ORAL_TABLET | ORAL | Status: AC
  Filled 2018-06-09: qty 4

## 2018-06-09 MED ORDER — CEPHALEXIN 500 MG PO CAPS: 500.0000 mg | ORAL_CAPSULE | Freq: Three times a day (TID) | ORAL | 0 refills | Status: AC
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
## 2018-06-09 NOTE — ED Triage Notes (Signed)
Patient reports flank pain, polyuria, urinary frequency, and foul smelling urine. States has been taking AZO and water with no relief. Patient also reports vaginal discharge and is sexually active.  Patient also reports chills and generalized feeling of fatigue, states she works in a [employer] industry and is exposed to a lot of things.

## 2018-06-10 LAB — HIV ANTIBODY (ROUTINE TESTING W REFLEX): HIV SCREEN 4TH GENERATION: NONREACTIVE

## 2018-06-10 LAB — RPR: RPR Ser Ql: NONREACTIVE

## 2018-06-11 LAB — URINE CULTURE: Special Requests: NORMAL

## 2018-06-12 ENCOUNTER — Telehealth (HOSPITAL_COMMUNITY): Payer: Self-pay

## 2018-06-12 LAB — CERVICOVAGINAL ANCILLARY ONLY
Bacterial vaginitis: POSITIVE — AB
CANDIDA VAGINITIS: NEGATIVE
Chlamydia: NEGATIVE
Neisseria Gonorrhea: NEGATIVE
Trichomonas: NEGATIVE

## 2018-06-12 MED ORDER — METRONIDAZOLE 500 MG PO TABS: 500.0000 mg | ORAL_TABLET | Freq: Two times a day (BID) | ORAL | 0 refills | Status: DC

## 2018-06-12 NOTE — Telephone Encounter (Signed)
Urine culture positive for E.coli, this was treated with Keflex at ucc visit. Pt called and made aware.

## 2018-06-12 NOTE — Telephone Encounter (Signed)
Bacterial vaginosis is positive. This was not treated at the urgent care visit.  Flagyl 500 mg BID x 7 days #14 no refills sent to patients pharmacy of choice.  Attempted to reach patient. No answer at this time. 

## 2018-06-13 ENCOUNTER — Telehealth (HOSPITAL_COMMUNITY): Payer: Self-pay

## 2018-08-01 ENCOUNTER — Inpatient Hospital Stay (HOSPITAL_COMMUNITY)
Admission: AD | Admit: 2018-08-01 | Discharge: 2018-08-01 | Disposition: A | Discharge status: Home / Self Care | Payer: Medicaid Other | Source: Ambulatory Visit | Attending: Family Medicine | Admitting: Family Medicine

## 2018-08-01 ENCOUNTER — Encounter (HOSPITAL_COMMUNITY): Payer: Self-pay | Admitting: *Deleted

## 2018-08-01 DIAGNOSIS — F17210 Nicotine dependence, cigarettes, uncomplicated: Secondary | ICD-10-CM | POA: Diagnosis not present

## 2018-08-01 DIAGNOSIS — Z3A01 Less than 8 weeks gestation of pregnancy: Secondary | ICD-10-CM | POA: Diagnosis not present

## 2018-08-01 DIAGNOSIS — O210 Mild hyperemesis gravidarum: Secondary | ICD-10-CM | POA: Diagnosis not present

## 2018-08-01 DIAGNOSIS — O99331 Smoking (tobacco) complicating pregnancy, first trimester: Secondary | ICD-10-CM | POA: Diagnosis not present

## 2018-08-01 LAB — URINALYSIS, ROUTINE W REFLEX MICROSCOPIC
Bilirubin Urine: NEGATIVE
GLUCOSE, UA: NEGATIVE mg/dL
Hgb urine dipstick: NEGATIVE
Ketones, ur: 20 mg/dL — AB
LEUKOCYTES UA: NEGATIVE
NITRITE: NEGATIVE
PH: 7 (ref 5.0–8.0)
Protein, ur: NEGATIVE mg/dL
SPECIFIC GRAVITY, URINE: 1.02 (ref 1.005–1.030)

## 2018-08-01 LAB — POCT PREGNANCY, URINE: Preg Test, Ur: POSITIVE — AB

## 2018-08-01 MED ORDER — PROMETHAZINE HCL 25 MG PO TABS: 25.0000 mg | ORAL_TABLET | Freq: Four times a day (QID) | ORAL | 2 refills | Status: DC | PRN

## 2018-08-01 NOTE — MAU Note (Signed)
Pt C/O nausea since last week, started vomiting over the weekend, no diarrhea.  Was sick last week with a cold, congestion & a fever.  Is still coughing today.
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
## 2018-08-01 NOTE — Discharge Instructions (Signed)

## 2018-08-17 ENCOUNTER — Inpatient Hospital Stay (HOSPITAL_COMMUNITY): Payer: Medicaid Other

## 2018-08-17 ENCOUNTER — Encounter (HOSPITAL_COMMUNITY): Payer: Self-pay

## 2018-08-17 ENCOUNTER — Inpatient Hospital Stay (HOSPITAL_COMMUNITY)
Admission: AD | Admit: 2018-08-17 | Discharge: 2018-08-17 | Disposition: A | Discharge status: Home / Self Care | Payer: Medicaid Other | Source: Ambulatory Visit | Attending: Obstetrics & Gynecology | Admitting: Obstetrics & Gynecology

## 2018-08-17 DIAGNOSIS — Z8349 Family history of other endocrine, nutritional and metabolic diseases: Secondary | ICD-10-CM | POA: Insufficient documentation

## 2018-08-17 DIAGNOSIS — O26891 Other specified pregnancy related conditions, first trimester: Secondary | ICD-10-CM | POA: Diagnosis not present

## 2018-08-17 DIAGNOSIS — J069 Acute upper respiratory infection, unspecified: Secondary | ICD-10-CM | POA: Diagnosis not present

## 2018-08-17 DIAGNOSIS — F17210 Nicotine dependence, cigarettes, uncomplicated: Secondary | ICD-10-CM | POA: Insufficient documentation

## 2018-08-17 DIAGNOSIS — Z3A01 Less than 8 weeks gestation of pregnancy: Secondary | ICD-10-CM | POA: Diagnosis not present

## 2018-08-17 DIAGNOSIS — Z8379 Family history of other diseases of the digestive system: Secondary | ICD-10-CM | POA: Diagnosis not present

## 2018-08-17 DIAGNOSIS — Z818 Family history of other mental and behavioral disorders: Secondary | ICD-10-CM | POA: Diagnosis not present

## 2018-08-17 DIAGNOSIS — Z8269 Family history of other diseases of the musculoskeletal system and connective tissue: Secondary | ICD-10-CM | POA: Diagnosis not present

## 2018-08-17 DIAGNOSIS — Z813 Family history of other psychoactive substance abuse and dependence: Secondary | ICD-10-CM | POA: Insufficient documentation

## 2018-08-17 DIAGNOSIS — Z886 Allergy status to analgesic agent status: Secondary | ICD-10-CM | POA: Insufficient documentation

## 2018-08-17 DIAGNOSIS — O99511 Diseases of the respiratory system complicating pregnancy, first trimester: Secondary | ICD-10-CM | POA: Diagnosis not present

## 2018-08-17 DIAGNOSIS — R109 Unspecified abdominal pain: Secondary | ICD-10-CM | POA: Diagnosis not present

## 2018-08-17 DIAGNOSIS — Z79899 Other long term (current) drug therapy: Secondary | ICD-10-CM | POA: Insufficient documentation

## 2018-08-17 LAB — WET PREP, GENITAL
Clue Cells Wet Prep HPF POC: NONE SEEN
Sperm: NONE SEEN
Trich, Wet Prep: NONE SEEN
YEAST WET PREP: NONE SEEN

## 2018-08-17 LAB — URINALYSIS, ROUTINE W REFLEX MICROSCOPIC
BILIRUBIN URINE: NEGATIVE
Glucose, UA: NEGATIVE mg/dL
HGB URINE DIPSTICK: NEGATIVE
Ketones, ur: NEGATIVE mg/dL
Leukocytes, UA: NEGATIVE
Nitrite: NEGATIVE
Protein, ur: NEGATIVE mg/dL
SPECIFIC GRAVITY, URINE: 1.021 (ref 1.005–1.030)
pH: 6 (ref 5.0–8.0)

## 2018-08-17 LAB — HCG, QUANTITATIVE, PREGNANCY: HCG, BETA CHAIN, QUANT, S: 75980 m[IU]/mL — AB (ref <5)

## 2018-08-17 NOTE — Discharge Instructions (Signed)
Abdominal Pain During Pregnancy Abdominal pain is common in pregnancy. Most of the time, it does not cause harm. There are many causes of abdominal pain. Some causes are more serious than others and sometimes the cause is not known. Abdominal pain can be a sign that something is very wrong with the pregnancy or the pain may have nothing to do with the pregnancy. Always tell your health care provider if you have any abdominal pain. Follow these instructions at home:  Do not have sex or put anything in your vagina until your symptoms go away completely.  Watch your abdominal pain for any changes.  Get plenty of rest until your pain improves.  Drink enough fluid to keep your urine clear or pale yellow.  Take over-the-counter or prescription medicines only as told by your health care provider.  Keep all follow-up visits as told by your health care provider. This is important. Contact a health care provider if:  You have a fever.  Your pain gets worse or you have cramping.  Your pain continues after resting. Get help right away if:  You are bleeding, leaking fluid, or passing tissue from the vagina.  You have vomiting or diarrhea that does not go away.  You have painful or bloody urination.  You notice a decrease in your baby's movements.  You feel very weak or faint.  You have shortness of breath.  You develop a severe headache with abdominal pain.  You have abnormal vaginal discharge with abdominal pain. This information is not intended to replace advice given to you by your health care provider. Make sure you discuss any questions you have with your health care provider. Document Released: 10/04/2005 Document Revised: 07/15/2016 Document Reviewed: 05/03/2013 Elsevier Interactive Patient Education  2018 [employer]  Upper Respiratory Infection, Adult Most upper respiratory infections (URIs) are a viral infection of the air passages leading to the lungs. A URI affects the  nose, throat, and upper air passages. The most common type of URI is nasopharyngitis and is typically referred to as "the common cold." URIs run their course and usually go away on their own. Most of the time, a URI does not require medical attention, but sometimes a bacterial infection in the upper airways can follow a viral infection. This is called a secondary infection. Sinus and middle ear infections are common types of secondary upper respiratory infections. Bacterial pneumonia can also complicate a URI. A URI can worsen asthma and chronic obstructive pulmonary disease (COPD). Sometimes, these complications can require emergency medical care and may be life threatening. What are the causes? Almost all URIs are caused by viruses. A virus is a type of germ and can spread from one person to another. What increases the risk? You may be at risk for a URI if:  You smoke.  You have chronic heart or lung disease.  You have a weakened defense (immune) system.  You are very young or very old.  You have nasal allergies or asthma.  You work in crowded or poorly ventilated areas.  You work in health care facilities or schools.  What are the signs or symptoms? Symptoms typically develop 2-3 days after you come in contact with a cold virus. Most viral URIs last 7-10 days. However, viral URIs from the influenza virus (flu virus) can last 14-18 days and are typically more severe. Symptoms may include:  Runny or stuffy (congested) nose.  Sneezing.  Cough.  Sore throat.  Headache.  Fatigue.  Fever.  Loss  of appetite.  Pain in your forehead, behind your eyes, and over your cheekbones (sinus pain).  Muscle aches.  How is this diagnosed? Your health care provider may diagnose a URI by:  Physical exam.  Tests to check that your symptoms are not due to another condition such as: ? Strep throat. ? Sinusitis. ? Pneumonia. ? Asthma.  How is this treated? A URI goes away on its own  with time. It cannot be cured with medicines, but medicines may be prescribed or recommended to relieve symptoms. Medicines may help:  Reduce your fever.  Reduce your cough.  Relieve nasal congestion.  Follow these instructions at home:  Take medicines only as directed by your health care provider.  Gargle warm saltwater or take cough drops to comfort your throat as directed by your health care provider.  Use a warm mist humidifier or inhale steam from a shower to increase air moisture. This may make it easier to breathe.  Drink enough fluid to keep your urine clear or pale yellow.  Eat soups and other clear broths and maintain good nutrition.  Rest as needed.  Return to work when your temperature has returned to normal or as your health care provider advises. You may need to stay home longer to avoid infecting others. You can also use a face mask and careful hand washing to prevent spread of the virus.  Increase the usage of your inhaler if you have asthma.  Do not use any tobacco products, including cigarettes, chewing tobacco, or electronic cigarettes. If you need help quitting, ask your health care provider. How is this prevented? The best way to protect yourself from getting a cold is to practice good hygiene.  Avoid oral or hand contact with people with cold symptoms.  Wash your hands often if contact occurs.  There is no clear evidence that vitamin C, vitamin E, echinacea, or exercise reduces the chance of developing a cold. However, it is always recommended to get plenty of rest, exercise, and practice good nutrition. Contact a health care provider if:  You are getting worse rather than better.  Your symptoms are not controlled by medicine.  You have chills.  You have worsening shortness of breath.  You have brown or red mucus.  You have yellow or brown nasal discharge.  You have pain in your face, especially when you bend forward.  You have a fever.  You  have swollen neck glands.  You have pain while swallowing.  You have white areas in the back of your throat. Get help right away if:  You have severe or persistent: ? Headache. ? Ear pain. ? Sinus pain. ? Chest pain.  You have chronic lung disease and any of the following: ? Wheezing. ? Prolonged cough. ? Coughing up blood. ? A change in your usual mucus.  You have a stiff neck.  You have changes in your: ? Vision. ? Hearing. ? Thinking. ? Mood. This information is not intended to replace advice given to you by your health care provider. Make sure you discuss any questions you have with your health care provider. Document Released: 03/30/2001 Document Revised: 06/06/2016 Document Reviewed: 01/09/2014 Elsevier Interactive Patient Education  [employer].
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
## 2018-08-17 NOTE — MAU Note (Signed)
Pt stated she has UR symptoms for about 3 weeks. Has had abd cramping x 4 days. No bleeding or vaginal discharge.
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
## 2018-08-18 LAB — HIV ANTIBODY (ROUTINE TESTING W REFLEX): HIV Screen 4th Generation wRfx: NONREACTIVE

## 2018-08-18 LAB — GC/CHLAMYDIA PROBE AMP (~~LOC~~) NOT AT ARMC
Chlamydia: NEGATIVE
Neisseria Gonorrhea: NEGATIVE

## 2018-08-21 ENCOUNTER — Inpatient Hospital Stay (HOSPITAL_COMMUNITY)
Admission: AD | Admit: 2018-08-21 | Discharge: 2018-08-21 | Disposition: A | Discharge status: Home / Self Care | Payer: Medicaid Other | Source: Ambulatory Visit | Attending: Obstetrics & Gynecology | Admitting: Obstetrics & Gynecology

## 2018-08-21 ENCOUNTER — Other Ambulatory Visit: Payer: Self-pay

## 2018-08-21 ENCOUNTER — Inpatient Hospital Stay (HOSPITAL_COMMUNITY): Payer: Medicaid Other

## 2018-08-21 DIAGNOSIS — R109 Unspecified abdominal pain: Secondary | ICD-10-CM | POA: Diagnosis not present

## 2018-08-21 DIAGNOSIS — Z3A08 8 weeks gestation of pregnancy: Secondary | ICD-10-CM

## 2018-08-21 DIAGNOSIS — O219 Vomiting of pregnancy, unspecified: Secondary | ICD-10-CM | POA: Diagnosis not present

## 2018-08-21 DIAGNOSIS — F17210 Nicotine dependence, cigarettes, uncomplicated: Secondary | ICD-10-CM | POA: Diagnosis not present

## 2018-08-21 DIAGNOSIS — Z3491 Encounter for supervision of normal pregnancy, unspecified, first trimester: Secondary | ICD-10-CM

## 2018-08-21 DIAGNOSIS — O99331 Smoking (tobacco) complicating pregnancy, first trimester: Secondary | ICD-10-CM | POA: Insufficient documentation

## 2018-08-21 DIAGNOSIS — R1030 Lower abdominal pain, unspecified: Secondary | ICD-10-CM | POA: Diagnosis not present

## 2018-08-21 DIAGNOSIS — O26891 Other specified pregnancy related conditions, first trimester: Secondary | ICD-10-CM | POA: Diagnosis not present

## 2018-08-21 DIAGNOSIS — O210 Mild hyperemesis gravidarum: Secondary | ICD-10-CM | POA: Insufficient documentation

## 2018-08-21 LAB — CBC WITH DIFFERENTIAL/PLATELET
Basophils Absolute: 0 10*3/uL (ref 0.0–0.1)
Basophils Relative: 0 %
Eosinophils Absolute: 0.1 10*3/uL (ref 0.0–0.5)
Eosinophils Relative: 1 %
HCT: 36 % (ref 36.0–46.0)
Hemoglobin: 12.2 g/dL (ref 12.0–15.0)
Lymphocytes Relative: 30 %
Lymphs Abs: 2.8 10*3/uL (ref 0.7–4.0)
MCH: 30.2 pg (ref 26.0–34.0)
MCHC: 33.9 g/dL (ref 30.0–36.0)
MCV: 89.1 fL (ref 80.0–100.0)
Monocytes Absolute: 0.5 10*3/uL (ref 0.1–1.0)
Monocytes Relative: 5 %
Neutro Abs: 6 10*3/uL (ref 1.7–7.7)
Neutrophils Relative %: 64 %
Platelets: 224 10*3/uL (ref 150–400)
RBC: 4.04 MIL/uL (ref 3.87–5.11)
RDW: 12.7 % (ref 11.5–15.5)
WBC: 9.4 10*3/uL (ref 4.0–10.5)
nRBC: 0 % (ref 0.0–0.2)

## 2018-08-21 LAB — URINALYSIS, ROUTINE W REFLEX MICROSCOPIC
Bilirubin Urine: NEGATIVE
Glucose, UA: NEGATIVE mg/dL
Hgb urine dipstick: NEGATIVE
Ketones, ur: NEGATIVE mg/dL
Leukocytes, UA: NEGATIVE
Nitrite: NEGATIVE
Protein, ur: NEGATIVE mg/dL
Specific Gravity, Urine: 1.01 (ref 1.005–1.030)
pH: 7 (ref 5.0–8.0)

## 2018-08-21 LAB — COMPREHENSIVE METABOLIC PANEL
ALK PHOS: 44 U/L (ref 38–126)
ALT: 15 U/L (ref 0–44)
ANION GAP: 10 (ref 5–15)
AST: 19 U/L (ref 15–41)
Albumin: 4.2 g/dL (ref 3.5–5.0)
BUN: 7 mg/dL (ref 6–20)
CALCIUM: 9 mg/dL (ref 8.9–10.3)
CHLORIDE: 99 mmol/L (ref 98–111)
CO2: 24 mmol/L (ref 22–32)
Creatinine, Ser: 0.5 mg/dL (ref 0.44–1.00)
Glucose, Bld: 123 mg/dL — ABNORMAL HIGH (ref 70–99)
Potassium: 3.8 mmol/L (ref 3.5–5.1)
SODIUM: 133 mmol/L — AB (ref 135–145)
Total Bilirubin: 0.4 mg/dL (ref 0.3–1.2)
Total Protein: 7.4 g/dL (ref 6.5–8.1)

## 2018-08-21 LAB — HCG, QUANTITATIVE, PREGNANCY: hCG, Beta Chain, Quant, S: 102359 m[IU]/mL — ABNORMAL HIGH

## 2018-08-21 NOTE — MAU Note (Signed)
Started throwing up at work [employer] she works in [employer] and the smells and motion set off her vomiting.  Also began having lower abdominal cramps afterwards.  Has had vomiting with this pregnancy.  Was prescribed phenergan for nausea but never filled it.  Hasn't taken anything for the pain.  States she hasn't eaten or drank much today.  No VB.
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
## 2018-09-05 DIAGNOSIS — Z3480 Encounter for supervision of other normal pregnancy, unspecified trimester: Secondary | ICD-10-CM | POA: Insufficient documentation

## 2018-09-06 ENCOUNTER — Ambulatory Visit (INDEPENDENT_AMBULATORY_CARE_PROVIDER_SITE_OTHER): Payer: Medicaid Other | Admitting: Advanced Practice Midwife

## 2018-09-06 ENCOUNTER — Other Ambulatory Visit: Payer: Self-pay

## 2018-09-06 ENCOUNTER — Encounter: Payer: Self-pay | Admitting: Obstetrics

## 2018-09-06 ENCOUNTER — Other Ambulatory Visit (HOSPITAL_COMMUNITY)
Admission: RE | Admit: 2018-09-06 | Discharge: 2018-09-06 | Disposition: A | Discharge status: Home / Self Care | Payer: Medicaid Other | Source: Ambulatory Visit | Attending: Advanced Practice Midwife | Admitting: Advanced Practice Midwife

## 2018-09-06 VITALS — BP 98/65 | HR 91 | Temp 98.4°F | Wt 97.5 lb

## 2018-09-06 DIAGNOSIS — F329 Major depressive disorder, single episode, unspecified: Secondary | ICD-10-CM

## 2018-09-06 DIAGNOSIS — Z3481 Encounter for supervision of other normal pregnancy, first trimester: Secondary | ICD-10-CM

## 2018-09-06 DIAGNOSIS — Z23 Encounter for immunization: Secondary | ICD-10-CM | POA: Diagnosis not present

## 2018-09-06 DIAGNOSIS — A6009 Herpesviral infection of other urogenital tract: Secondary | ICD-10-CM

## 2018-09-06 DIAGNOSIS — Z3480 Encounter for supervision of other normal pregnancy, unspecified trimester: Secondary | ICD-10-CM | POA: Diagnosis not present

## 2018-09-06 DIAGNOSIS — O98311 Other infections with a predominantly sexual mode of transmission complicating pregnancy, first trimester: Secondary | ICD-10-CM

## 2018-09-06 DIAGNOSIS — O99341 Other mental disorders complicating pregnancy, first trimester: Secondary | ICD-10-CM

## 2018-09-06 NOTE — Patient Instructions (Signed)
First Trimester of Pregnancy The first trimester of pregnancy is from week 1 until the end of week 13 (months 1 through 3). A week after a sperm fertilizes an egg, the egg will implant on the wall of the uterus. This embryo will begin to develop into a baby. Genes from you and your partner will form the baby. The female genes will determine whether the baby will be a boy or a girl. At 6-8 weeks, the eyes and face will be formed, and the heartbeat can be seen on ultrasound. At the end of 12 weeks, all the baby's organs will be formed. Now that you are pregnant, you will want to do everything you can to have a healthy baby. Two of the most important things are to get good prenatal care and to follow your health care provider's instructions. Prenatal care is all the medical care you receive before the baby's birth. This care will help prevent, find, and treat any problems during the pregnancy and childbirth. Body changes during your first trimester Your body goes through many changes during pregnancy. The changes vary from woman to woman.  You may gain or lose a couple of pounds at first.  You may feel sick to your stomach (nauseous) and you may throw up (vomit). If the vomiting is uncontrollable, call your health care provider.  You may tire easily.  You may develop headaches that can be relieved by medicines. All medicines should be approved by your health care provider.  You may urinate more often. Painful urination may mean you have a bladder infection.  You may develop heartburn as a result of your pregnancy.  You may develop constipation because certain hormones are causing the muscles that push stool through your intestines to slow down.  You may develop hemorrhoids or swollen veins (varicose veins).  Your breasts may begin to grow larger and become tender. Your nipples may stick out more, and the tissue that surrounds them (areola) may become darker.  Your gums may bleed and may be  sensitive to brushing and flossing.  Dark spots or blotches (chloasma, mask of pregnancy) may develop on your face. This will likely fade after the baby is born.  Your menstrual periods will stop.  You may have a loss of appetite.  You may develop cravings for certain kinds of food.  You may have changes in your emotions from day to day, such as being excited to be pregnant or being concerned that something may go wrong with the pregnancy and baby.  You may have more vivid and strange dreams.  You may have changes in your hair. These can include thickening of your hair, rapid growth, and changes in texture. Some women also have hair loss during or after pregnancy, or hair that feels dry or thin. Your hair will most likely return to normal after your baby is born.  What to expect at prenatal visits During a routine prenatal visit:  You will be weighed to make sure you and the baby are growing normally.  Your blood pressure will be taken.  Your abdomen will be measured to track your baby's growth.  The fetal heartbeat will be listened to between weeks 10 and 14 of your pregnancy.  Test results from any previous visits will be discussed.  Your health care provider may ask you:  How you are feeling.  If you are feeling the baby move.  If you have had any abnormal symptoms, such as leaking fluid, bleeding, severe headaches,   or abdominal cramping.  If you are using any tobacco products, including cigarettes, chewing tobacco, and electronic cigarettes.  If you have any questions.  Other tests that may be performed during your first trimester include:  Blood tests to find your blood type and to check for the presence of any previous infections. The tests will also be used to check for low iron levels (anemia) and protein on red blood cells (Rh antibodies). Depending on your risk factors, or if you previously had diabetes during pregnancy, you may have tests to check for high blood  sugar that affects pregnant women (gestational diabetes).  Urine tests to check for infections, diabetes, or protein in the urine.  An ultrasound to confirm the proper growth and development of the baby.  Fetal screens for spinal cord problems (spina bifida) and Down syndrome.  HIV (human immunodeficiency virus) testing. Routine prenatal testing includes screening for HIV, unless you choose not to have this test.  You may need other tests to make sure you and the baby are doing well.  Follow these instructions at home: Medicines  Follow your health care provider's instructions regarding medicine use. Specific medicines may be either safe or unsafe to take during pregnancy.  Take a prenatal vitamin that contains at least 600 micrograms (mcg) of folic acid.  If you develop constipation, try taking a stool softener if your health care provider approves. Eating and drinking  Eat a balanced diet that includes fresh fruits and vegetables, whole grains, good sources of protein such as meat, eggs, or tofu, and low-fat dairy. Your health care provider will help you determine the amount of weight gain that is right for you.  Avoid raw meat and uncooked cheese. These carry germs that can cause birth defects in the baby.  Eating four or five small meals rather than three large meals a day may help relieve nausea and vomiting. If you start to feel nauseous, eating a few soda crackers can be helpful. Drinking liquids between meals, instead of during meals, also seems to help ease nausea and vomiting.  Limit foods that are high in fat and processed sugars, such as fried and sweet foods.  To prevent constipation: ? Eat foods that are high in fiber, such as fresh fruits and vegetables, whole grains, and beans. ? Drink enough fluid to keep your urine clear or pale yellow. Activity  Exercise only as directed by your health care provider. Most women can continue their usual exercise routine during  pregnancy. Try to exercise for 30 minutes at least 5 days a week. Exercising will help you: ? Control your weight. ? Stay in shape. ? Be prepared for labor and delivery.  Experiencing pain or cramping in the lower abdomen or lower back is a good sign that you should stop exercising. Check with your health care provider before continuing with normal exercises.  Try to avoid standing for long periods of time. Move your legs often if you must stand in one place for a long time.  Avoid heavy lifting.  Wear low-heeled shoes and practice good posture.  You may continue to have sex unless your health care provider tells you not to. Relieving pain and discomfort  Wear a good support bra to relieve breast tenderness.  Take warm sitz baths to soothe any pain or discomfort caused by hemorrhoids. Use hemorrhoid cream if your health care provider approves.  Rest with your legs elevated if you have leg cramps or low back pain.  If you develop   varicose veins in your legs, wear support hose. Elevate your feet for 15 minutes, 3-4 times a day. Limit salt in your diet. Prenatal care  Schedule your prenatal visits by the twelfth week of pregnancy. They are usually scheduled monthly at first, then more often in the last 2 months before delivery.  Write down your questions. Take them to your prenatal visits.  Keep all your prenatal visits as told by your health care provider. This is important. Safety  Wear your seat belt at all times when driving.  Make a list of emergency phone numbers, including numbers for family, friends, the hospital, and police and fire departments. General instructions  Ask your health care provider for a referral to a local prenatal education class. Begin classes no later than the beginning of month 6 of your pregnancy.  Ask for help if you have counseling or nutritional needs during pregnancy. Your health care provider can offer advice or refer you to specialists for help  with various needs.  Do not use hot tubs, steam rooms, or saunas.  Do not douche or use tampons or scented sanitary pads.  Do not cross your legs for long periods of time.  Avoid cat litter boxes and soil used by cats. These carry germs that can cause birth defects in the baby and possibly loss of the fetus by miscarriage or stillbirth.  Avoid all smoking, herbs, alcohol, and medicines not prescribed by your health care provider. Chemicals in these products affect the formation and growth of the baby.  Do not use any products that contain nicotine or tobacco, such as cigarettes and e-cigarettes. If you need help quitting, ask your health care provider. You may receive counseling support and other resources to help you quit.  Schedule a dentist appointment. At home, brush your teeth with a soft toothbrush and be gentle when you floss. Contact a health care provider if:  You have dizziness.  You have mild pelvic cramps, pelvic pressure, or nagging pain in the abdominal area.  You have persistent nausea, vomiting, or diarrhea.  You have a bad smelling vaginal discharge.  You have pain when you urinate.  You notice increased swelling in your face, hands, legs, or ankles.  You are exposed to fifth disease or chickenpox.  You are exposed to German measles (rubella) and have never had it. Get help right away if:  You have a fever.  You are leaking fluid from your vagina.  You have spotting or bleeding from your vagina.  You have severe abdominal cramping or pain.  You have rapid weight gain or loss.  You vomit blood or material that looks like coffee grounds.  You develop a severe headache.  You have shortness of breath.  You have any kind of trauma, such as from a fall or a car accident. Summary  The first trimester of pregnancy is from week 1 until the end of week 13 (months 1 through 3).  Your body goes through many changes during pregnancy. The changes vary from  woman to woman.  You will have routine prenatal visits. During those visits, your health care provider will examine you, discuss any test results you may have, and talk with you about how you are feeling. This information is not intended to replace advice given to you by your health care provider. Make sure you discuss any questions you have with your health care provider. Document Released: 09/28/2001 Document Revised: 09/15/2016 Document Reviewed: 09/15/2016 Elsevier Interactive Patient Education  2018 Elsevier   Inc.  

## 2018-09-06 NOTE — Progress Notes (Signed)
   PRENATAL VISIT NOTE  Subjective:  Tiffany Wong is a 27 y.o. [id number] at 6142w3d being seen today for initial prenatal visit.  She is currently monitored for the following issues for this high-risk pregnancy and has Bipolar affective disorder (HCC); Genital herpes; Tobacco abuse; Contraception management; Tooth pain; Borderline personality disorder (HCC); Vaginal discharge; MDD (major depressive disorder), recurrent episode, severe (HCC); Bipolar affective disorder, currently depressed, moderate (HCC); and Supervision of other normal pregnancy, antepartum on their problem list.  Patient reports fatigue and mild cramping.  Contractions: Irritability. Vag. Bleeding: None.   . Denies leaking of fluid.   The following portions of the patient's history were reviewed and updated as appropriate: allergies, current medications, past family history, past medical history, past social history, past surgical history and problem list. Problem list updated.  Objective:   Vitals:   09/06/18 1018  BP: 98/65  Pulse: 91  Temp: 98.4 F (36.9 C)  Weight: 44.2 kg    Fetal Status: Fetal Heart Rate (bpm): 169         VS reviewed, nursing note reviewed,  Constitutional: well developed, well nourished, no distress HEENT: normocephalic CV: normal rate Pulm/chest wall: normal effort Breast Exam: Deferred Abdomen: soft Neuro: alert and oriented x 3 Skin: warm, dry Psych: affect normal Pelvic exam: Cervix pink, visually closed, without lesion, scant white creamy discharge, vaginal walls and external genitalia normal Bimanual exam: Cervix 0/long/high, firm, anterior, neg CMT, uterus nontender, ~ 10 week size, adnexa without tenderness, enlargement, or mass  Assessment and Plan:  Pregnancy: G4P1021 at 4342w3d  1. Supervision of other normal pregnancy, antepartum --Anticipatory guidance about next visits/weeks of pregnancy given. --Discussed and offered genetic screening options, including Quad screen/AFP,  NIPS testing, and option to decline testing. Benefits/risks/alternatives reviewed. Pt aware that anatomy US is form of genetic screening with lower accuracy in detecting trisomies than blood work.  Pt chooses/declines genetic screening today.  - Cytology - PAP( [facility]) - Genetic Screening - Enroll Patient in Babyscripts - Obstetric Panel, Including HIV - Hemoglobinopathy evaluation - Culture, OB Urine - Cystic Fibrosis Mutation 97 - SMN1 COPY NUMBER ANALYSIS (SMA Carrier Screen) --Pt is taking PNV daily  2. Genital herpes complicating pregnancy in antepartum condition, first trimester --Will need prophylaxis at 36 weeks.  No s/sx of outbreak today.  3. Depression complicating pregnancy in first trimester, antepartum --Pt reports that things were difficult at the time of conception with lots of stress at home. She is doing better now and feels like she has good support.  She denies any thoughts of harming herself or others. She feels safe at home. --Will continue to assess for depression during pregnancy.  Preterm labor symptoms and general obstetric precautions including but not limited to vaginal bleeding, contractions, leaking of fluid and fetal movement were reviewed in detail with the patient. Please refer to After Visit Summary for other counseling recommendations.  Return in about 4 weeks (around 10/04/2018).  No future appointments.  Sharen CounterLisa Leftwich-Kirby, CNM

## 2018-09-07 LAB — CERVICOVAGINAL ANCILLARY ONLY
BACTERIAL VAGINITIS: NEGATIVE
CANDIDA VAGINITIS: NEGATIVE
CHLAMYDIA, DNA PROBE: NEGATIVE
NEISSERIA GONORRHEA: NEGATIVE
TRICH (WINDOWPATH): NEGATIVE

## 2018-09-07 NOTE — Addendum Note (Signed)
Addended by: Maretta BeesMCGLASHAN, Spencer Cardinal J on: 09/07/2018 08:21 AM   Modules accepted: Orders

## 2018-09-08 LAB — URINE CULTURE, OB REFLEX

## 2018-09-08 LAB — CULTURE, OB URINE

## 2018-09-10 LAB — CYTOLOGY - PAP
DIAGNOSIS: UNDETERMINED — AB
HPV: DETECTED — AB

## 2018-09-13 LAB — HEMOGLOBINOPATHY EVALUATION
HEMOGLOBIN A2 QUANTITATION: 2.2 % (ref 1.8–3.2)
HGB C: 0 %
HGB S: 0 %
HGB VARIANT: 0 %
Hemoglobin F Quantitation: 0 % (ref 0.0–2.0)
Hgb A: 97.8 % (ref 96.4–98.8)

## 2018-09-13 LAB — OBSTETRIC PANEL, INCLUDING HIV
ANTIBODY SCREEN: NEGATIVE
BASOS: 0 %
Basophils Absolute: 0 10*3/uL (ref 0.0–0.2)
EOS (ABSOLUTE): 0.1 10*3/uL (ref 0.0–0.4)
EOS: 1 %
HEMATOCRIT: 35.4 % (ref 34.0–46.6)
HEMOGLOBIN: 12.1 g/dL (ref 11.1–15.9)
HIV SCREEN 4TH GENERATION: NONREACTIVE
Hepatitis B Surface Ag: NEGATIVE
Immature Grans (Abs): 0.1 10*3/uL (ref 0.0–0.1)
Immature Granulocytes: 1 %
LYMPHS ABS: 2.1 10*3/uL (ref 0.7–3.1)
LYMPHS: 24 %
MCH: 30.1 pg (ref 26.6–33.0)
MCHC: 34.2 g/dL (ref 31.5–35.7)
MCV: 88 fL (ref 79–97)
MONOS ABS: 0.7 10*3/uL (ref 0.1–0.9)
Monocytes: 8 %
NEUTROS ABS: 5.7 10*3/uL (ref 1.4–7.0)
Neutrophils: 66 %
Platelets: 268 10*3/uL (ref 150–450)
RBC: 4.02 x10E6/uL (ref 3.77–5.28)
RDW: 12.9 % (ref 12.3–15.4)
RH TYPE: POSITIVE
RPR Ser Ql: NONREACTIVE
Rubella Antibodies, IGG: 6.62 index (ref >0.99)
WBC: 8.6 10*3/uL (ref 3.4–10.8)

## 2018-09-13 LAB — SMN1 COPY NUMBER ANALYSIS (SMA CARRIER SCREENING)

## 2018-09-13 LAB — CYSTIC FIBROSIS MUTATION 97: GENE DIS ANAL CARRIER INTERP BLD/T-IMP: NOT DETECTED

## 2018-09-14 ENCOUNTER — Encounter: Payer: Self-pay | Admitting: Advanced Practice Midwife

## 2018-09-14 DIAGNOSIS — R87619 Unspecified abnormal cytological findings in specimens from cervix uteri: Secondary | ICD-10-CM | POA: Insufficient documentation

## 2018-09-14 DIAGNOSIS — R87810 Cervical high risk human papillomavirus (HPV) DNA test positive: Secondary | ICD-10-CM

## 2018-09-14 DIAGNOSIS — R87610 Atypical squamous cells of undetermined significance on cytologic smear of cervix (ASC-US): Secondary | ICD-10-CM | POA: Insufficient documentation

## 2018-09-18 ENCOUNTER — Encounter: Payer: Self-pay | Admitting: Advanced Practice Midwife

## 2018-10-04 ENCOUNTER — Encounter: Payer: Self-pay | Admitting: Certified Nurse Midwife

## 2018-10-04 ENCOUNTER — Ambulatory Visit (INDEPENDENT_AMBULATORY_CARE_PROVIDER_SITE_OTHER): Payer: Medicaid Other | Admitting: Certified Nurse Midwife

## 2018-10-04 ENCOUNTER — Other Ambulatory Visit: Payer: Self-pay

## 2018-10-04 VITALS — BP 102/63 | HR 98 | Wt 105.0 lb

## 2018-10-04 DIAGNOSIS — K5900 Constipation, unspecified: Secondary | ICD-10-CM

## 2018-10-04 DIAGNOSIS — Z3482 Encounter for supervision of other normal pregnancy, second trimester: Secondary | ICD-10-CM

## 2018-10-04 DIAGNOSIS — Z3480 Encounter for supervision of other normal pregnancy, unspecified trimester: Secondary | ICD-10-CM

## 2018-10-04 DIAGNOSIS — Z720 Tobacco use: Secondary | ICD-10-CM

## 2018-10-04 DIAGNOSIS — O99612 Diseases of the digestive system complicating pregnancy, second trimester: Secondary | ICD-10-CM

## 2018-10-04 DIAGNOSIS — B009 Herpesviral infection, unspecified: Secondary | ICD-10-CM

## 2018-10-04 NOTE — Progress Notes (Signed)
   PRENATAL VISIT NOTE  Subjective:  Tiffany Wong is a 27 y.o. G4P1021 at 7275w3d being seen today for ongoing prenatal care.  She is currently monitored for the following issues for this low-risk pregnancy and has Bipolar affective disorder (HCC); HSV-2 infection; Tobacco abuse; Borderline personality disorder (HCC); Supervision of other normal pregnancy, antepartum; and ASCUS with positive high risk HPV cervical on their problem list.  Patient reports constipation.  Contractions: Irritability. Vag. Bleeding: None.  Movement: Present. Denies leaking of fluid.   The following portions of the patient's history were reviewed and updated as appropriate: allergies, current medications, past family history, past medical history, past social history, past surgical history and problem list. Problem list updated.  Objective:   Vitals:   10/04/18 1003  BP: 102/63  Pulse: 98  Weight: 105 lb (47.6 kg)    Fetal Status: Fetal Heart Rate (bpm): 146   Movement: Present     General:  Alert, oriented and cooperative. Patient is in no acute distress.  Skin: Skin is warm and dry. No rash noted.   Cardiovascular: Normal heart rate noted  Respiratory: Normal respiratory effort, no problems with respiration noted  Abdomen: Soft, gravid, appropriate for gestational age.  Pain/Pressure: Present     Pelvic: Cervical exam deferred        Extremities: Normal range of motion.  Edema: None  Mental Status: Normal mood and affect. Normal behavior. Normal judgment and thought content.   Assessment and Plan:  Pregnancy: G4P1021 at 4675w3d  1. Supervision of other normal pregnancy, antepartum - Routine prenatal care  - Anticipatory guidance on upcoming appointments - Discussed results of NOB labs including PAP, discussed need and importance of colpo- patient reports needing colpo following last pap but did not get it due to canceling appointment. Reports she will follow through with colpo this time.  - Colpo needed  PP with MD  - US MFM OB DETAIL +14 WK; Future  2. HSV-2 infection - Suppression at 35 weeks   3. Tobacco abuse - Currently continues tobacco use, educated and discussed risk of smoking during pregnancy, patient is trying to cut down on amount  - US MFM OB DETAIL +14 WK; Future  4. Constipation during pregnancy in second trimester - Reports occasional constipation, last BM was yesterday with hard stools  - Educated and discussed eating habits, reports eating fast food 5 times out of the week and not eating much fruits or vegetables  - Educated on increasing amount of dark leafy greens, beans protein and water in diet and to cut down on fast food, patient verbalizes understanding   Preterm labor symptoms and general obstetric precautions including but not limited to vaginal bleeding, contractions, leaking of fluid and fetal movement were reviewed in detail with the patient. Please refer to After Visit Summary for other counseling recommendations.  Return in about 4 weeks (around 11/01/2018) for ROB.  Future Appointments  Date Time Provider Department Center  11/02/2018 10:30 AM Sharyon Cableogers, America Sandall C, CNM [facility] None  11/06/2018 10:30 AM [facility] US 1 [facility]    Sharyon CableVeronica C Madeline Bebout, CNM

## 2018-10-04 NOTE — Progress Notes (Signed)
ROB C/o constipation   

## 2018-10-04 NOTE — Patient Instructions (Signed)

## 2018-10-12 ENCOUNTER — Other Ambulatory Visit: Payer: Self-pay

## 2018-10-12 ENCOUNTER — Encounter (HOSPITAL_COMMUNITY): Payer: Self-pay

## 2018-10-12 ENCOUNTER — Inpatient Hospital Stay (HOSPITAL_COMMUNITY)
Admission: AD | Admit: 2018-10-12 | Discharge: 2018-10-12 | Disposition: A | Discharge status: Home / Self Care | Payer: Medicaid Other | Source: Ambulatory Visit | Attending: Obstetrics & Gynecology | Admitting: Obstetrics & Gynecology

## 2018-10-12 DIAGNOSIS — Z3A15 15 weeks gestation of pregnancy: Secondary | ICD-10-CM | POA: Diagnosis not present

## 2018-10-12 DIAGNOSIS — O99012 Anemia complicating pregnancy, second trimester: Secondary | ICD-10-CM | POA: Insufficient documentation

## 2018-10-12 DIAGNOSIS — O26892 Other specified pregnancy related conditions, second trimester: Secondary | ICD-10-CM | POA: Insufficient documentation

## 2018-10-12 DIAGNOSIS — R55 Syncope and collapse: Secondary | ICD-10-CM | POA: Insufficient documentation

## 2018-10-12 DIAGNOSIS — E876 Hypokalemia: Secondary | ICD-10-CM | POA: Insufficient documentation

## 2018-10-12 DIAGNOSIS — F17210 Nicotine dependence, cigarettes, uncomplicated: Secondary | ICD-10-CM | POA: Insufficient documentation

## 2018-10-12 DIAGNOSIS — R42 Dizziness and giddiness: Secondary | ICD-10-CM | POA: Diagnosis present

## 2018-10-12 DIAGNOSIS — D649 Anemia, unspecified: Secondary | ICD-10-CM | POA: Insufficient documentation

## 2018-10-12 DIAGNOSIS — O99332 Smoking (tobacco) complicating pregnancy, second trimester: Secondary | ICD-10-CM | POA: Insufficient documentation

## 2018-10-12 LAB — URINALYSIS, ROUTINE W REFLEX MICROSCOPIC
Bilirubin Urine: NEGATIVE
GLUCOSE, UA: NEGATIVE mg/dL
HGB URINE DIPSTICK: NEGATIVE
KETONES UR: NEGATIVE mg/dL
LEUKOCYTES UA: NEGATIVE
Nitrite: NEGATIVE
PH: 6 (ref 5.0–8.0)
PROTEIN: NEGATIVE mg/dL
Specific Gravity, Urine: 1.019 (ref 1.005–1.030)

## 2018-10-12 LAB — COMPREHENSIVE METABOLIC PANEL
ALK PHOS: 30 U/L — AB (ref 38–126)
ALT: 11 U/L (ref 0–44)
ANION GAP: 6 (ref 5–15)
AST: 15 U/L (ref 15–41)
Albumin: 3.1 g/dL — ABNORMAL LOW (ref 3.5–5.0)
BUN: 7 mg/dL (ref 6–20)
CALCIUM: 8.4 mg/dL — AB (ref 8.9–10.3)
CO2: 22 mmol/L (ref 22–32)
CREATININE: 0.33 mg/dL — AB (ref 0.44–1.00)
Chloride: 105 mmol/L (ref 98–111)
Glucose, Bld: 98 mg/dL (ref 70–99)
Potassium: 3.1 mmol/L — ABNORMAL LOW (ref 3.5–5.1)
Sodium: 133 mmol/L — ABNORMAL LOW (ref 135–145)
TOTAL PROTEIN: 6.5 g/dL (ref 6.5–8.1)
Total Bilirubin: 0.6 mg/dL (ref 0.3–1.2)

## 2018-10-12 LAB — CBC
HCT: 29.7 % — ABNORMAL LOW (ref 36.0–46.0)
HEMOGLOBIN: 10.2 g/dL — AB (ref 12.0–15.0)
MCH: 31 pg (ref 26.0–34.0)
MCHC: 34.3 g/dL (ref 30.0–36.0)
MCV: 90.3 fL (ref 80.0–100.0)
PLATELETS: 174 10*3/uL (ref 150–400)
RBC: 3.29 MIL/uL — ABNORMAL LOW (ref 3.87–5.11)
RDW: 13.3 % (ref 11.5–15.5)
WBC: 12.1 10*3/uL — ABNORMAL HIGH (ref 4.0–10.5)
nRBC: 0 % (ref 0.0–0.2)

## 2018-10-12 MED ORDER — POTASSIUM CHLORIDE CRYS ER 20 MEQ PO TBCR: 40.0000 meq | EXTENDED_RELEASE_TABLET | Freq: Every day | ORAL | 0 refills | Status: DC

## 2018-10-12 NOTE — MAU Note (Signed)
Pt presents due to dizziness and pressure behind her eyes while standing at work today. States did not resolve after of rest and water. Normal appetite, frequent stools today and reports "just feeling dry."
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
## 2018-10-12 NOTE — MAU Note (Signed)
Pt presents to MAU stating that she got dizziness and light headed at work. Denies any VB or abnormal discharge

## 2018-10-17 ENCOUNTER — Other Ambulatory Visit: Payer: Self-pay

## 2018-10-17 ENCOUNTER — Ambulatory Visit (INDEPENDENT_AMBULATORY_CARE_PROVIDER_SITE_OTHER): Payer: Medicaid Other | Admitting: Internal Medicine

## 2018-10-17 ENCOUNTER — Telehealth: Payer: Self-pay

## 2018-10-17 VITALS — BP 106/71 | HR 83 | Wt 105.2 lb

## 2018-10-17 DIAGNOSIS — Z3482 Encounter for supervision of other normal pregnancy, second trimester: Secondary | ICD-10-CM

## 2018-10-17 DIAGNOSIS — Z3480 Encounter for supervision of other normal pregnancy, unspecified trimester: Secondary | ICD-10-CM

## 2018-10-17 DIAGNOSIS — Z720 Tobacco use: Secondary | ICD-10-CM

## 2018-10-17 DIAGNOSIS — E876 Hypokalemia: Secondary | ICD-10-CM

## 2018-10-17 MED ORDER — POTASSIUM CHLORIDE CRYS ER 20 MEQ PO TBCR: 40.0000 meq | EXTENDED_RELEASE_TABLET | Freq: Every day | ORAL | 0 refills | Status: DC

## 2018-10-17 NOTE — Progress Notes (Signed)
error 

## 2018-10-17 NOTE — Progress Notes (Signed)
   PRENATAL VISIT NOTE  Subjective:  Tiffany Wong is a 27 y.o. G4P1021 at 6962w2d being seen today for ongoing prenatal care.  She is currently monitored for the following issues for this low-risk pregnancy and has Bipolar affective disorder (HCC); HSV-2 infection; Tobacco abuse; Borderline personality disorder (HCC); Supervision of other normal pregnancy, antepartum; and ASCUS with positive high risk HPV cervical on their problem list.  Patient reports constipation (improving) and concerns about her work environment. .  Contractions: Not present. Vag. Bleeding: None.  Movement: Present. Denies leaking of fluid.   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 Reports that her shift is 8.5 hours and very labor intensive. She is on her feet for the most of the shift and is required to walk up and down several flights of stairs. There is the potential for exposure to several types of chemicals, human waste, and expired animals. Worried about what she is breathing in and coming into contact with while she is at work. Requests letter to be written taking ehr out of work for the remainder of the pregnancy.   The following portions of the patient's history were reviewed and updated as appropriate: allergies, current medications, past family history, past medical history, past social history, past surgical history and problem list. Problem list updated.  Objective:   Vitals:   10/17/18 0833  BP: 106/71  Pulse: 83  Weight: 105 lb 3.2 oz (47.7 kg)    Fetal Status: Fetal Heart Rate (bpm): 150   Movement: Present     General:  Alert, oriented and cooperative. Patient is in no acute distress.  Skin: Skin is warm and dry. No rash noted.   Cardiovascular: Normal heart rate noted  Respiratory: Normal respiratory effort, no problems with respiration noted  Abdomen: Soft, gravid, appropriate for gestational age.  Pain/Pressure: Present     Pelvic: Cervical exam deferred         Extremities: Normal range of motion.  Edema: None  Mental Status: Normal mood and affect. Normal behavior. Normal judgment and thought content.   Assessment and Plan:  Pregnancy: G4P1021 at 1062w2d  1. Supervision of other normal pregnancy, antepartum -continue routine prenatal care  - AFP, Serum, Open Spina Bifida (ordered, patient upset about blood draw and refused)  -discussed with patient that she would need to return with FMLA paperwork, unlikely that there would be medical reason to write patient completely out of work at this point in pregnancy   2. Tobacco abuse Encouraged to quit.   3. Hypokalemia K 3.1 at MAU visit on 12/26. Patient requests new Rx to be sent because she had not picked up medication yet.  - potassium chloride SA (K-DUR,KLOR-CON) 20 MEQ tablet; Take 2 tablets (40 mEq total) by mouth daily for 3 days.  Dispense: 6 tablet; Refill: 0    There are no diagnoses linked to this encounter. Preterm labor symptoms and general obstetric precautions including but not limited to vaginal bleeding, contractions, leaking of fluid and fetal movement were reviewed in detail with the patient. Please refer to After Visit Summary for other counseling recommendations.  No follow-ups on file.  Future Appointments  Date Time Provider Department Center  11/02/2018 10:30 AM Sharyon Cableogers, Veronica C, CNM [facility] None  11/06/2018 10:30 AM [facility] US 1 [facility]    De Hollingsheadatherine L Roe Koffman, DO

## 2018-10-17 NOTE — Telephone Encounter (Signed)
Returned call and pt stated that she will have to call back about paperwork because her job is confusing her right now.

## 2018-10-20 ENCOUNTER — Telehealth: Payer: Self-pay

## 2018-10-20 ENCOUNTER — Other Ambulatory Visit: Payer: Medicaid Other

## 2018-10-20 NOTE — Telephone Encounter (Signed)
The patient was here today for a lab visit only and wanted to speak to someone about her work situation. 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 I let the patient know that we would need paperwork from them to fill out such as FMLA or short term disability. The patient then stated she does not qualify for FMLA, so I told her we would need the short term disability paperwork faxed over and I also asked for her supervisors name and number so I could call him and see why they are telling her she can not work. I called the patients supervisor, Onalee Huaavid, and he explained to me that they have not taken her out of work and that they need her to work, but the patient keeps calling out and is at her max attendance points. I let the supervisor know that we will wait to receive her short term disability paperwork, which he said he gave to the patient, and go from there.

## 2018-10-24 LAB — AFP, SERUM, OPEN SPINA BIFIDA
AFP MoM: 1.44
AFP Value: 73.8 ng/mL
Gest. Age on Collection Date: 16.7 weeks
Maternal Age At EDD: 29.2 yr
OSBR Risk 1 IN: 6441
Test Results:: NEGATIVE
Weight: 105 [lb_av]

## 2018-10-30 ENCOUNTER — Encounter (HOSPITAL_COMMUNITY): Payer: Self-pay

## 2018-11-02 ENCOUNTER — Ambulatory Visit (INDEPENDENT_AMBULATORY_CARE_PROVIDER_SITE_OTHER): Payer: Medicaid Other | Admitting: Certified Nurse Midwife

## 2018-11-02 VITALS — BP 114/68 | HR 91 | Wt 106.0 lb

## 2018-11-02 DIAGNOSIS — Z720 Tobacco use: Secondary | ICD-10-CM

## 2018-11-02 DIAGNOSIS — Z3482 Encounter for supervision of other normal pregnancy, second trimester: Secondary | ICD-10-CM | POA: Diagnosis not present

## 2018-11-02 DIAGNOSIS — Z3A18 18 weeks gestation of pregnancy: Secondary | ICD-10-CM

## 2018-11-02 DIAGNOSIS — Z3480 Encounter for supervision of other normal pregnancy, unspecified trimester: Secondary | ICD-10-CM

## 2018-11-02 DIAGNOSIS — B009 Herpesviral infection, unspecified: Secondary | ICD-10-CM

## 2018-11-02 DIAGNOSIS — F339 Major depressive disorder, recurrent, unspecified: Secondary | ICD-10-CM

## 2018-11-02 MED ORDER — NICOTINE 7 MG/24HR TD PT24: 7.0000 mg | MEDICATED_PATCH | Freq: Every day | TRANSDERMAL | 1 refills | Status: DC

## 2018-11-02 MED ORDER — SERTRALINE HCL 50 MG PO TABS: 50.0000 mg | ORAL_TABLET | Freq: Every day | ORAL | 1 refills | Status: DC

## 2018-11-02 NOTE — Progress Notes (Signed)
   PRENATAL VISIT NOTE  Subjective:  Tiffany Wong is a 28 y.o. G8U1103 at [redacted]w[redacted]d being seen today for ongoing prenatal care.  She is currently monitored for the following issues for this low-risk pregnancy and has Bipolar affective disorder (HCC); HSV-2 infection; Tobacco abuse; Borderline personality disorder (HCC); Supervision of other normal pregnancy, antepartum; and ASCUS with positive high risk HPV cervical on their problem list.  Patient reports no complaints.  Contractions: Not present. Vag. Bleeding: None.  Movement: Present. Denies leaking of fluid.   The following portions of the patient's history were reviewed and updated as appropriate: allergies, current medications, past family history, past medical history, past social history, past surgical history and problem list. Problem list updated.  Objective:   Vitals:   11/02/18 1036  BP: 114/68  Pulse: 91  Weight: 106 lb (48.1 kg)    Fetal Status: Fetal Heart Rate (bpm): 147   Movement: Present     General:  Alert, oriented and cooperative. Patient is in no acute distress.  Skin: Skin is warm and dry. No rash noted.   Cardiovascular: Normal heart rate noted  Respiratory: Normal respiratory effort, no problems with respiration noted  Abdomen: Soft, gravid, appropriate for gestational age.  Pain/Pressure: Present     Pelvic: Cervical exam deferred        Extremities: Normal range of motion.  Edema: None  Mental Status: Normal mood and affect. Normal behavior. Normal judgment and thought content.   Assessment and Plan:  Pregnancy: G4P1021 at [redacted]w[redacted]d  1. Supervision of other normal pregnancy, antepartum - patient doing well, routine prenatal care - [place] appointment on 11/06/2018 - Anticipatory guidance on upcoming appointments   2. HSV-2 infection - Suppression at 36 weeks   3. Tobacco abuse - Discussed smoking cessation  - Patient wants to stop smoking, reports stopped smoking with last pregnancy with use of Nicotine patch  but started back smoking with additional stress  - Smokes 6-8 cigarettes per day  - Encouraged use of patch and tapering down cigarettes then ultimately tapering down daily use of patch to where cessation is achieved. Patient verbalizes understanding and agrees to plan of care.  - 5-10 minutes used for smoking cessation counseling   - nicotine (NICODERM CQ - DOSED IN MG/24 HR) 7 mg/24hr patch; Place 1 patch (7 mg total) onto the skin daily.  Dispense: 28 patch; Refill: 1  4. Major depression, recurrent, chronic (HCC) - Patient reports hx of depression in past in addition to bipolar  - Was on medication up until October 2017 but then stopped medications due to feeling that she had beat depression  - Since getting pregnant reports depression is back and she is having spells 2-3 times a week, denies suicidal ideation  - Ambulatory referral to [facility] - sertraline (ZOLOFT) 50 MG tablet; Take 1 tablet (50 mg total) by mouth daily.  Dispense: 30 tablet; Refill: 1  Preterm labor symptoms and general obstetric precautions including but not limited to vaginal bleeding, contractions, leaking of fluid and fetal movement were reviewed in detail with the patient. Please refer to After Visit Summary for other counseling recommendations.  Return in about 4 weeks (around 11/30/2018) for ROB.  Future Appointments  Date Time Provider Department Center  11/06/2018 10:30 AM [facility] [place] [facility]  11/30/2018 10:30 AM Sharyon Cable, CNM [facility] None    Sharyon Cable, CNM

## 2018-11-02 NOTE — Patient Instructions (Signed)

## 2018-11-02 NOTE — Progress Notes (Signed)
Patient reports good fetal movement with occasional pressure. 

## 2018-11-06 ENCOUNTER — Ambulatory Visit (HOSPITAL_COMMUNITY): Admission: RE | Admit: 2018-11-06 | Payer: Medicaid Other | Source: Ambulatory Visit

## 2018-11-07 ENCOUNTER — Ambulatory Visit (INDEPENDENT_AMBULATORY_CARE_PROVIDER_SITE_OTHER): Payer: Self-pay | Admitting: Clinical

## 2018-11-07 DIAGNOSIS — F3162 Bipolar disorder, current episode mixed, moderate: Secondary | ICD-10-CM

## 2018-11-07 NOTE — BH Specialist Note (Signed)
Integrated Behavioral Health Initial Visit  MRN: [id number] Name: Tiffany Wong  Number of Integrated Behavioral Health Clinician visits:: 1/6 Session Start time: 10:022  Session End time: 11:03 Total time: 1 hour  Type of Service: Integrated Behavioral Health- Individual/Family Interpretor:No. Interpretor Name and Language: n/a   Warm Hand Off Completed.       SUBJECTIVE: Tiffany Wong is a 28 y.o. female accompanied by n/a Patient was referred by Steward Drone, CNM for major depression, recurrent, chronic. Patient reports the following symptoms/concerns: Pt states her primary concern today is life stress (revolving around relationship difficulty with FOB) increasing her symptoms of depression, anxiety, and increased tobacco use.  Pt prefers no BH medication in pregnancy, as she has been managing well, unmedicated, for over 2 years. Pt prefers to learn self-coping strategies for stress management at this time. Pt's goal is a healthy baby.  Duration of problem: Current pregnancy; Severity of problem: severe  OBJECTIVE: Mood: Anxious and Depressed and Affect: Appropriate Risk of harm to self or others: No plan to harm self or others  LIFE CONTEXT: Family and Social: Pt living with her best friend, as of today; prior, pt was living with FOB and his mother.  School/Work: Pt works full-time Self-Care: Working on stop smoking.  Life Changes: Current pregnancy, relationship difficulties with FOB; move out of FOB's mother's home today, and move into best friend's home  GOALS ADDRESSED: Patient will: 1. Reduce symptoms of: anxiety, depression, mood instability and stress 2. Increase knowledge and/or ability of: healthy habits and self-management skills  3. Demonstrate ability to: Increase healthy adjustment to current life circumstances and Decrease self-medicating behaviors  INTERVENTIONS: Interventions utilized: Mindfulness or Management consultant, Psychoeducation and/or Health  Education and Link to [facility]  Standardized Assessments completed: GAD-7 and PHQ 9  ASSESSMENT: Patient currently experiencing Bipolar affective disorder, per previous diagnosis by psychiatry, and Psychosocial stress.    Patient may benefit from psychoeducation and brief therapeutic interventions regarding coping with symptoms of depression, anxiety, and life stress .  PLAN: 1. Follow up with behavioral health clinician on : Three weeks 2. Behavioral recommendations:  -CALM relaxation breathing exercise at least once daily (morning); continue previous self-coping strategies -Read educational materials regarding coping with symptoms of depression and anxiety -Consider re-establishing care at [facility] [place]; consider returning to [facility] classes 3. Referral(s): Integrated Art gallery manager (In Clinic), [facility] (LME/Outside Clinic) and [employer]:  Housing 4. "From scale of 1-10, how likely are you to follow plan?": -  Rae Lips, LCSW  Depression screen [facility] 2/9 11/07/2018 06/21/2014 12/10/2013  Decreased Interest 2 0 0  Down, Depressed, Hopeless 3 0 0  PHQ - 2 Score 5 0 0  Altered sleeping 2 - -  Tired, decreased energy 3 - -  Change in appetite 1 - -  Feeling bad or failure about yourself  3 - -  Trouble concentrating 2 - -  Moving slowly or fidgety/restless 3 - -  Suicidal thoughts 0 - -  PHQ-9 Score 19 - -   GAD 7 : Generalized Anxiety Score 11/07/2018  Nervous, Anxious, on Edge 3  Control/stop worrying 3  Worry too much - different things 3  Trouble relaxing 3  Restless 2  Easily annoyed or irritable 3  Afraid - awful might happen 3  Total GAD 7 Score 20

## 2018-11-08 ENCOUNTER — Other Ambulatory Visit: Payer: Self-pay | Admitting: Certified Nurse Midwife

## 2018-11-08 ENCOUNTER — Ambulatory Visit (HOSPITAL_COMMUNITY)
Admission: RE | Admit: 2018-11-08 | Discharge: 2018-11-08 | Disposition: A | Discharge status: Home / Self Care | Payer: Medicaid Other | Source: Ambulatory Visit | Attending: Certified Nurse Midwife | Admitting: Certified Nurse Midwife

## 2018-11-08 DIAGNOSIS — Z3A19 19 weeks gestation of pregnancy: Secondary | ICD-10-CM

## 2018-11-08 DIAGNOSIS — Z363 Encounter for antenatal screening for malformations: Secondary | ICD-10-CM | POA: Diagnosis not present

## 2018-11-08 DIAGNOSIS — Z720 Tobacco use: Secondary | ICD-10-CM | POA: Diagnosis present

## 2018-11-08 DIAGNOSIS — Z3480 Encounter for supervision of other normal pregnancy, unspecified trimester: Secondary | ICD-10-CM

## 2018-11-08 DIAGNOSIS — O99332 Smoking (tobacco) complicating pregnancy, second trimester: Secondary | ICD-10-CM

## 2018-11-22 ENCOUNTER — Encounter: Payer: Self-pay | Admitting: Advanced Practice Midwife

## 2018-11-22 ENCOUNTER — Inpatient Hospital Stay (HOSPITAL_COMMUNITY)
Admission: AD | Admit: 2018-11-22 | Discharge: 2018-11-22 | Disposition: A | Discharge status: Home / Self Care | Payer: Medicaid Other | Attending: Obstetrics and Gynecology | Admitting: Obstetrics and Gynecology

## 2018-11-22 DIAGNOSIS — R0981 Nasal congestion: Secondary | ICD-10-CM | POA: Insufficient documentation

## 2018-11-22 DIAGNOSIS — O26892 Other specified pregnancy related conditions, second trimester: Secondary | ICD-10-CM | POA: Diagnosis not present

## 2018-11-22 DIAGNOSIS — J029 Acute pharyngitis, unspecified: Secondary | ICD-10-CM | POA: Insufficient documentation

## 2018-11-22 DIAGNOSIS — Z3480 Encounter for supervision of other normal pregnancy, unspecified trimester: Secondary | ICD-10-CM

## 2018-11-22 DIAGNOSIS — O99512 Diseases of the respiratory system complicating pregnancy, second trimester: Secondary | ICD-10-CM | POA: Diagnosis not present

## 2018-11-22 DIAGNOSIS — J069 Acute upper respiratory infection, unspecified: Secondary | ICD-10-CM

## 2018-11-22 DIAGNOSIS — R51 Headache: Secondary | ICD-10-CM | POA: Insufficient documentation

## 2018-11-22 DIAGNOSIS — O99332 Smoking (tobacco) complicating pregnancy, second trimester: Secondary | ICD-10-CM | POA: Diagnosis not present

## 2018-11-22 DIAGNOSIS — Z3A21 21 weeks gestation of pregnancy: Secondary | ICD-10-CM | POA: Diagnosis not present

## 2018-11-22 LAB — INFLUENZA PANEL BY PCR (TYPE A & B)
Influenza A By PCR: NEGATIVE
Influenza B By PCR: NEGATIVE

## 2018-11-22 LAB — GROUP A STREP BY PCR: GROUP A STREP BY PCR: NOT DETECTED

## 2018-11-22 NOTE — Discharge Instructions (Signed)
Influenza, Adult Influenza is also called "the flu." It is an infection in the lungs, nose, and throat (respiratory tract). It is caused by a virus. The flu causes symptoms that are similar to symptoms of a cold. It also causes a high fever and body aches. The flu spreads easily from person to person (is contagious). Getting a flu shot (influenza vaccination) every year is the best way to prevent the flu. What are the causes? This condition is caused by the influenza virus. You can get the virus by:  Breathing in droplets that are in the air from the cough or sneeze of a person who has the virus.  Touching something that has the virus on it (is contaminated) and then touching your mouth, nose, or eyes. What increases the risk? Certain things may make you more likely to get the flu. These include:  Not washing your hands often.  Having close contact with many people during cold and flu season.  Touching your mouth, eyes, or nose without first washing your hands.  Not getting a flu shot every year. You may have a higher risk for the flu, along with serious problems such as a lung infection (pneumonia), if you:  Are older than 65.  Are pregnant.  Have a weakened disease-fighting system (immune system) because of a disease or taking certain medicines.  Have a long-term (chronic) illness, such as: ? Heart, kidney, or lung disease. ? Diabetes. ? Asthma.  Have a liver disorder.  Are very overweight (morbidly obese).  Have anemia. This is a condition that affects your red blood cells. What are the signs or symptoms? Symptoms usually begin suddenly and last 4-14 days. They may include:  Fever and chills.  Headaches, body aches, or muscle aches.  Sore throat.  Cough.  Runny or stuffy (congested) nose.  Chest discomfort.  Not wanting to eat as much as normal (poor appetite).  Weakness or feeling tired (fatigue).  Dizziness.  Feeling sick to your stomach (nauseous) or  throwing up (vomiting). How is this treated? If the flu is found early, you can be treated with medicine that can help reduce how bad the illness is and how long it lasts (antiviral medicine). This may be given by mouth (orally) or through an IV tube. Taking care of yourself at home can help your symptoms get better. Your doctor may suggest:  Taking over-the-counter medicines.  Drinking plenty of fluids. The flu often goes away on its own. If you have very bad symptoms or other problems, you may be treated in a hospital. Follow these instructions at home:     Activity  Rest as needed. Get plenty of sleep.  Stay home from work or school as told by your doctor. ? Do not leave home until you do not have a fever for 24 hours without taking medicine. ? Leave home only to visit your doctor. Eating and drinking  Take an ORS (oral rehydration solution). This is a drink that is sold at pharmacies and stores.  Drink enough fluid to keep your pee (urine) pale yellow.  Drink clear fluids in small amounts as you are able. Clear fluids include: ? Water. ? Ice chips. ? Fruit juice that has water added (diluted fruit juice). ? Low-calorie sports drinks.  Eat bland, easy-to-digest foods in small amounts as you are able. These foods include: ? Bananas. ? Applesauce. ? Rice. ? Lean meats. ? Toast. ? Crackers.  Do not eat or drink: ? Fluids that have a lot  of sugar or caffeine. ? Alcohol. ? Spicy or fatty foods. General instructions  Take over-the-counter and prescription medicines only as told by your doctor.  Use a cool mist humidifier to add moisture to the air in your home. This can make it easier for you to breathe.  Cover your mouth and nose when you cough or sneeze.  Wash your hands with soap and water often, especially after you cough or sneeze. If you cannot use soap and water, use alcohol-based hand sanitizer.  Keep all follow-up visits as told by your doctor. This is  important. How is this prevented?   Get a flu shot every year. You may get the flu shot in late summer, fall, or winter. Ask your doctor when you should get your flu shot.  Avoid contact with people who are sick during fall and winter (cold and flu season). Contact a doctor if:  You get new symptoms.  You have: ? Chest pain. ? Watery poop (diarrhea). ? A fever.  Your cough gets worse.  You start to have more mucus.  You feel sick to your stomach.  You throw up. Get help right away if you:  Have shortness of breath.  Have trouble breathing.  Have skin or nails that turn a bluish color.  Have very bad pain or stiffness in your neck.  Get a sudden headache.  Get sudden pain in your face or ear.  Cannot eat or drink without throwing up. Summary  Influenza ("the flu") is an infection in the lungs, nose, and throat. It is caused by a virus.  Take over-the-counter and prescription medicines only as told by your doctor.  Getting a flu shot every year is the best way to avoid getting the flu. This information is not intended to replace advice given to you by your health care provider. Make sure you discuss any questions you have with your health care provider. Document Released: 07/13/2008 Document Revised: 03/22/2018 Document Reviewed: 03/22/2018 Elsevier Interactive Patient Education  2019 Elsevier Inc. Upper Respiratory Infection, Adult An upper respiratory infection (URI) affects the nose, throat, and upper air passages. URIs are caused by germs (viruses). The most common type of URI is often called "the common cold." Medicines cannot cure URIs, but you can do things at home to relieve your symptoms. URIs usually get better within 7-10 days. Follow these instructions at home: Activity  Rest as needed.  If you have a fever, stay home from work or school until your fever is gone, or until your doctor says you may return to work or school. ? You should stay home  until you cannot spread the infection anymore (you are not contagious). ? Your doctor may have you wear a face mask so you have less risk of spreading the infection. Relieving symptoms  Gargle with a salt-water mixture 3-4 times a day or as needed. To make a salt-water mixture, completely dissolve -1 tsp of salt in 1 cup of warm water.  Use a cool-mist humidifier to add moisture to the air. This can help you breathe more easily. Eating and drinking   Drink enough fluid to keep your pee (urine) pale yellow.  Eat soups and other clear broths. General instructions   Take over-the-counter and prescription medicines only as told by your doctor. These include cold medicines, fever reducers, and cough suppressants.  Do not use any products that contain nicotine or tobacco. These include cigarettes and e-cigarettes. If you need help quitting, ask your doctor.  Avoid  being where people are smoking (avoid secondhand smoke).  Make sure you get regular shots and get the flu shot every year.  Keep all follow-up visits as told by your doctor. This is important. How to avoid spreading infection to others   Wash your hands often with soap and water. If you do not have soap and water, use hand sanitizer.  Avoid touching your mouth, face, eyes, or nose.  Cough or sneeze into a tissue or your sleeve or elbow. Do not cough or sneeze into your hand or into the air. Contact a doctor if:  You are getting worse, not better.  You have any of these: ? A fever. ? Chills. ? Brown or red mucus in your nose. ? Yellow or brown fluid (discharge)coming from your nose. ? Pain in your face, especially when you bend forward. ? Swollen neck glands. ? Pain with swallowing. ? White areas in the back of your throat. Get help right away if:  You have shortness of breath that gets worse.  You have very bad or constant: ? Headache. ? Ear pain. ? Pain in your forehead, behind your eyes, and over your  cheekbones (sinus pain). ? Chest pain.  You have long-lasting (chronic) lung disease along with any of these: ? Wheezing. ? Long-lasting cough. ? Coughing up blood. ? A change in your usual mucus.  You have a stiff neck.  You have changes in your: ? Vision. ? Hearing. ? Thinking. ? Mood. Summary  An upper respiratory infection (URI) is caused by a germ called a virus. The most common type of URI is often called "the common cold."  URIs usually get better within 7-10 days.  Take over-the-counter and prescription medicines only as told by your doctor. This information is not intended to replace advice given to you by your health care provider. Make sure you discuss any questions you have with your health care provider. Document Released: 03/22/2008 Document Revised: 05/27/2017 Document Reviewed: 05/27/2017 Elsevier Interactive Patient Education  2019 [employer].
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
## 2018-11-22 NOTE — MAU Note (Signed)
Pt reports body aches, sore throat, nasal congestion, cough, headache. Unsure if she has had a fever. Symptoms started yesterday.

## 2018-11-25 ENCOUNTER — Encounter (HOSPITAL_COMMUNITY): Payer: Self-pay | Admitting: *Deleted

## 2018-11-25 ENCOUNTER — Inpatient Hospital Stay (HOSPITAL_COMMUNITY)
Admission: AD | Admit: 2018-11-25 | Discharge: 2018-11-25 | Disposition: A | Discharge status: Home / Self Care | Payer: Medicaid Other | Attending: Obstetrics and Gynecology | Admitting: Obstetrics and Gynecology

## 2018-11-25 DIAGNOSIS — Z3A21 21 weeks gestation of pregnancy: Secondary | ICD-10-CM | POA: Insufficient documentation

## 2018-11-25 DIAGNOSIS — F17210 Nicotine dependence, cigarettes, uncomplicated: Secondary | ICD-10-CM | POA: Insufficient documentation

## 2018-11-25 DIAGNOSIS — O99332 Smoking (tobacco) complicating pregnancy, second trimester: Secondary | ICD-10-CM | POA: Insufficient documentation

## 2018-11-25 DIAGNOSIS — R6889 Other general symptoms and signs: Secondary | ICD-10-CM

## 2018-11-25 DIAGNOSIS — R05 Cough: Secondary | ICD-10-CM | POA: Insufficient documentation

## 2018-11-25 DIAGNOSIS — R0981 Nasal congestion: Secondary | ICD-10-CM | POA: Diagnosis not present

## 2018-11-25 DIAGNOSIS — M549 Dorsalgia, unspecified: Secondary | ICD-10-CM | POA: Insufficient documentation

## 2018-11-25 DIAGNOSIS — O26892 Other specified pregnancy related conditions, second trimester: Secondary | ICD-10-CM | POA: Diagnosis not present

## 2018-11-25 DIAGNOSIS — R079 Chest pain, unspecified: Secondary | ICD-10-CM | POA: Diagnosis not present

## 2018-11-25 LAB — URINALYSIS, ROUTINE W REFLEX MICROSCOPIC
Bilirubin Urine: NEGATIVE
Glucose, UA: NEGATIVE mg/dL
Hgb urine dipstick: NEGATIVE
Ketones, ur: NEGATIVE mg/dL
Leukocytes, UA: NEGATIVE
Nitrite: NEGATIVE
PROTEIN: NEGATIVE mg/dL
Specific Gravity, Urine: 1.01 (ref 1.005–1.030)
pH: 7 (ref 5.0–8.0)

## 2018-11-25 LAB — INFLUENZA PANEL BY PCR (TYPE A & B)
Influenza A By PCR: POSITIVE — AB
Influenza B By PCR: NEGATIVE

## 2018-11-25 MED ORDER — OSELTAMIVIR PHOSPHATE 75 MG PO CAPS: 75.0000 mg | ORAL_CAPSULE | Freq: Two times a day (BID) | ORAL | 0 refills | Status: AC

## 2018-11-25 MED ORDER — BENZONATATE 100 MG PO CAPS: 100.0000 mg | ORAL_CAPSULE | Freq: Four times a day (QID) | ORAL | 0 refills | Status: AC | PRN

## 2018-11-25 NOTE — Discharge Instructions (Signed)
Pregnancy and Influenza    Influenza, also called the flu, is an infection of the lungs and airways (respiratory tract). If you are pregnant, you are more likely to catch the flu. You are also more likely to have a more serious case of the flu. This is because pregnancy causes changes to your body's disease-fighting system (immune system), heart, and lungs. If you develop a bad case of the flu, especially with a high fever, this can cause problems for you and your developing baby.  How do people get the flu?  The flu is caused by a type of germ called a virus. It spreads when virus particles get passed from person to person by:   Being near a sick person who is coughing or sneezing.   Touching something that has the virus on it and then touching your mouth, nose, or face.  The influenza virus is most common during the fall and winter.  How can I protect myself against the flu?   Get a flu shot. The best way to prevent the flu is to get a flu shot before flu season starts. The flu shot is not dangerous for your developing baby. It may even help protect your baby from the flu for up to 6 months after birth.   Wash your hands often with soap and warm water. If soap and water are not available, use hand sanitizer.   Do not come in close contact with sick people.   Do not share food, drinks, or utensils with other people.   Avoid touching your eyes, nose, and mouth.   Clean frequently used surfaces at home, school, or work.   Practice healthy lifestyle habits, such as:  ? Eating a healthy, balanced diet.  ? Drinking plenty of fluids.  ? Exercising regularly or as told by your health care provider.  ? Sleeping 7-9 hours each night.  ? Finding ways to manage stress.  What should I do if I have flu symptoms?   If you have any symptoms of the flu, even after getting a flu shot, contact your health care provider right away.   To reduce fever, take over-the-counter acetaminophen as told by your health care  provider.   If you have the flu, you may get antiviral medicine to keep the flu from becoming severe and to shorten how long it lasts.   Avoid spreading the flu to others:  ? Stay home until you are well.  ? Cover your nose and mouth when you cough or sneeze.  ? Wash your hands often.  Follow these instructions at home:   Take over-the-counter and prescription medicines only as told by your health care provider. Do not take any medicine, including cold or flu medicine, unless your health care provider tells you to do so.   If you were prescribed antiviral medicine, take it as told by your health care provider. Do not stop taking the antiviral medicine even if you start to feel better.   Eat a nutrient-rich diet that includes fresh fruits and vegetables, whole grains, lean protein, and low-fat dairy.   Drink enough fluid to keep your urine clear or pale yellow.   Get plenty of rest.  Contact a health care provider if:   You have fever or chills.   You have a cough, sore throat, or stuffy nose.   You have worsening or unusual:  ? Muscle aches.  ? Headache.  ? Tiredness.  ? Loss of appetite.   You   have vomiting or diarrhea.  Get help right away if:   You have trouble breathing.   You have chest pain.   You have abdominal pain.   You begin to have labor pains.   You have a fever that does not go down 24 hours after you take medicine.   You do not feel your baby move.   You have diarrhea or vomiting that will not go away.   You have dizziness or confusion.   Your symptoms do not improve, even with treatment.  Summary   If you are pregnant, you are more likely to catch the flu. You are also more likely to have a more serious case of the flu.   If you have flu-like symptoms, call your health care provider right away. If you develop a bad case of the flu, especially with a high fever, this can be dangerous for your developing baby.   The best way to prevent the flu is to get a flu shot before flu  season starts. The flu shot is not dangerous for your developing baby.   If you have the flu and were prescribed antiviral medicine, take it as told by your health care provider.  This information is not intended to replace advice given to you by your health care provider. Make sure you discuss any questions you have with your health care provider.  Document Released: 08/06/2008 Document Revised: 11/30/2016 Document Reviewed: 11/30/2016  Elsevier Interactive Patient Education  2019 Elsevier Inc.

## 2018-11-25 NOTE — MAU Note (Addendum)
Tiffany Wong is a 28 y.o. at [redacted]w[redacted]d here in MAU reporting:  +cough. Productive yellow. +generalized body aches Onset of complaint: started Wednesday . States was seen that day and had a negative flu swab. States she feels worse. +still having congestion Pain score: 10/10. Has taken immune boosters but no other medication. Vitals:   11/25/18 1053  BP: 123/74  Pulse: (!) 108  Resp: 19  Temp: 99.1 F (37.3 C)  SpO2: 100%    FHT:160 Lab orders placed from triage: ua
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
## 2018-11-30 ENCOUNTER — Ambulatory Visit (INDEPENDENT_AMBULATORY_CARE_PROVIDER_SITE_OTHER): Payer: Medicaid Other | Admitting: Certified Nurse Midwife

## 2018-11-30 ENCOUNTER — Encounter: Payer: Self-pay | Admitting: Certified Nurse Midwife

## 2018-11-30 VITALS — BP 94/63 | HR 105 | Wt 116.0 lb

## 2018-11-30 DIAGNOSIS — O26899 Other specified pregnancy related conditions, unspecified trimester: Secondary | ICD-10-CM

## 2018-11-30 DIAGNOSIS — B009 Herpesviral infection, unspecified: Secondary | ICD-10-CM

## 2018-11-30 DIAGNOSIS — Z3482 Encounter for supervision of other normal pregnancy, second trimester: Secondary | ICD-10-CM

## 2018-11-30 DIAGNOSIS — R102 Pelvic and perineal pain: Secondary | ICD-10-CM

## 2018-11-30 DIAGNOSIS — O26892 Other specified pregnancy related conditions, second trimester: Secondary | ICD-10-CM

## 2018-11-30 DIAGNOSIS — Z3480 Encounter for supervision of other normal pregnancy, unspecified trimester: Secondary | ICD-10-CM

## 2018-11-30 DIAGNOSIS — Z3A22 22 weeks gestation of pregnancy: Secondary | ICD-10-CM

## 2018-11-30 DIAGNOSIS — Z720 Tobacco use: Secondary | ICD-10-CM

## 2018-11-30 MED ORDER — COMFORT FIT MATERNITY SUPP MED MISC: 1.0000 | Freq: Every day | 0 refills | Status: DC

## 2018-11-30 NOTE — Progress Notes (Signed)
Pt not taking zoloft  CC: pain on left side of lower back on left side describes as joint point.  Still taking Rx for cough.  Recently getting over FLU.

## 2018-11-30 NOTE — Progress Notes (Signed)
   PRENATAL VISIT NOTE  Subjective:  Tiffany Wong is a 28 y.o. G4P1021 at [redacted]w[redacted]d being seen today for ongoing prenatal care.  She is currently monitored for the following issues for this low-risk pregnancy and has Bipolar affective disorder (HCC); HSV-2 infection; Tobacco abuse; Borderline personality disorder (HCC); Supervision of other normal pregnancy, antepartum; and ASCUS with positive high risk HPV cervical on their problem list.  Patient reports pelvic pain with movement.  Contractions: Not present. Vag. Bleeding: None.  Movement: Present. Denies leaking of fluid.   The following portions of the patient's history were reviewed and updated as appropriate: allergies, current medications, past family history, past medical history, past social history, past surgical history and problem list. Problem list updated.  Objective:   Vitals:   11/30/18 1047  BP: 94/63  Pulse: (!) 105  Weight: 116 lb (52.6 kg)    Fetal Status:     Movement: Present     General:  Alert, oriented and cooperative. Patient is in no acute distress.  Skin: Skin is warm and dry. No rash noted.   Cardiovascular: Normal heart rate noted  Respiratory: Normal respiratory effort, no problems with respiration noted  Abdomen: Soft, gravid, appropriate for gestational age.  Pain/Pressure: Present     Pelvic: Cervical exam deferred        Extremities: Normal range of motion.  Edema: Trace  Mental Status: Normal mood and affect. Normal behavior. Normal judgment and thought content.   Assessment and Plan:  Pregnancy: G4P1021 at [redacted]w[redacted]d  1. Supervision of other normal pregnancy, antepartum - Routine care  - Anticipatory guidance on upcoming appointments - After discussed with Asher Muir patient decided on not taking Zoloft as it can cause increased symptoms of her bipolar, patient reports having great support systems and is very driven, focused and balanced   2. Tobacco abuse - Patient was unable to get Nicotine patch due to  Medicaid not covering cost  - Patient reports not smoking since diagnoses of Flu on 11/25/2018, reports "everything happens for a reason" and she has not smoked since   3. HSV-2 infection - Suppression started at 36 weeks   4. Pain of round ligament during pregnancy - Elastic Bandages & Supports (COMFORT FIT MATERNITY SUPP MED) MISC; 1 Device by Does not apply route daily.  Dispense: 1 each; Refill: 0  Preterm labor symptoms and general obstetric precautions including but not limited to vaginal bleeding, contractions, leaking of fluid and fetal movement were reviewed in detail with the patient. Please refer to After Visit Summary for other counseling recommendations.  Return in about 4 weeks (around 12/28/2018) for ROB.  Future Appointments  Date Time Provider Department Center  12/28/2018  3:00 PM Conan Bowens, MD [facility] None    Sharyon Cable, CNM

## 2018-12-28 ENCOUNTER — Encounter: Payer: Self-pay | Admitting: Obstetrics

## 2018-12-28 ENCOUNTER — Ambulatory Visit (INDEPENDENT_AMBULATORY_CARE_PROVIDER_SITE_OTHER): Payer: Medicaid Other | Admitting: Obstetrics

## 2018-12-28 ENCOUNTER — Other Ambulatory Visit (HOSPITAL_COMMUNITY)
Admission: RE | Admit: 2018-12-28 | Discharge: 2018-12-28 | Disposition: A | Discharge status: Home / Self Care | Payer: Medicaid Other | Source: Ambulatory Visit | Attending: Obstetrics and Gynecology | Admitting: Obstetrics and Gynecology

## 2018-12-28 ENCOUNTER — Other Ambulatory Visit: Payer: Self-pay

## 2018-12-28 VITALS — BP 104/66 | HR 87

## 2018-12-28 DIAGNOSIS — N760 Acute vaginitis: Secondary | ICD-10-CM

## 2018-12-28 DIAGNOSIS — O98812 Other maternal infectious and parasitic diseases complicating pregnancy, second trimester: Secondary | ICD-10-CM

## 2018-12-28 DIAGNOSIS — B009 Herpesviral infection, unspecified: Secondary | ICD-10-CM

## 2018-12-28 DIAGNOSIS — F339 Major depressive disorder, recurrent, unspecified: Secondary | ICD-10-CM

## 2018-12-28 DIAGNOSIS — N898 Other specified noninflammatory disorders of vagina: Secondary | ICD-10-CM | POA: Insufficient documentation

## 2018-12-28 DIAGNOSIS — B373 Candidiasis of vulva and vagina: Secondary | ICD-10-CM

## 2018-12-28 DIAGNOSIS — B9689 Other specified bacterial agents as the cause of diseases classified elsewhere: Secondary | ICD-10-CM

## 2018-12-28 DIAGNOSIS — Z3480 Encounter for supervision of other normal pregnancy, unspecified trimester: Secondary | ICD-10-CM

## 2018-12-28 DIAGNOSIS — T7491XS Unspecified adult maltreatment, confirmed, sequela: Secondary | ICD-10-CM

## 2018-12-28 DIAGNOSIS — B3731 Acute candidiasis of vulva and vagina: Secondary | ICD-10-CM

## 2018-12-28 DIAGNOSIS — O98912 Unspecified maternal infectious and parasitic disease complicating pregnancy, second trimester: Secondary | ICD-10-CM

## 2018-12-28 DIAGNOSIS — Z3A26 26 weeks gestation of pregnancy: Secondary | ICD-10-CM

## 2018-12-28 MED ORDER — VALACYCLOVIR HCL 1 G PO TABS: 1000.0000 mg | ORAL_TABLET | Freq: Every day | ORAL | 11 refills | Status: DC

## 2018-12-28 MED ORDER — TERCONAZOLE 0.4 % VA CREA: 1.0000 | TOPICAL_CREAM | Freq: Every day | VAGINAL | 0 refills | Status: DC

## 2018-12-28 MED ORDER — TINIDAZOLE 500 MG PO TABS: 1000.0000 mg | ORAL_TABLET | Freq: Every day | ORAL | 2 refills | Status: DC

## 2018-12-28 NOTE — Progress Notes (Signed)
Pt states she may have  BV requesting swab Pt also requesting HSV medication.
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
## 2018-12-29 LAB — CERVICOVAGINAL ANCILLARY ONLY
Bacterial vaginitis: NEGATIVE
Candida vaginitis: POSITIVE — AB
Chlamydia: NEGATIVE
Neisseria Gonorrhea: NEGATIVE
Trichomonas: NEGATIVE

## 2018-12-30 ENCOUNTER — Other Ambulatory Visit: Payer: Self-pay | Admitting: Obstetrics

## 2018-12-30 DIAGNOSIS — B3731 Acute candidiasis of vulva and vagina: Secondary | ICD-10-CM

## 2018-12-30 DIAGNOSIS — B373 Candidiasis of vulva and vagina: Secondary | ICD-10-CM

## 2018-12-30 MED ORDER — TERCONAZOLE 0.4 % VA CREA: 1.0000 | TOPICAL_CREAM | Freq: Every day | VAGINAL | 0 refills | Status: DC

## 2019-01-11 ENCOUNTER — Other Ambulatory Visit: Payer: Medicaid Other

## 2019-01-11 ENCOUNTER — Other Ambulatory Visit: Payer: Self-pay

## 2019-01-11 ENCOUNTER — Ambulatory Visit (INDEPENDENT_AMBULATORY_CARE_PROVIDER_SITE_OTHER): Payer: Medicaid Other | Admitting: Obstetrics & Gynecology

## 2019-01-11 VITALS — BP 104/71 | HR 94 | Wt 118.7 lb

## 2019-01-11 DIAGNOSIS — Z3483 Encounter for supervision of other normal pregnancy, third trimester: Secondary | ICD-10-CM

## 2019-01-11 DIAGNOSIS — Z3480 Encounter for supervision of other normal pregnancy, unspecified trimester: Secondary | ICD-10-CM

## 2019-01-11 DIAGNOSIS — Z23 Encounter for immunization: Secondary | ICD-10-CM | POA: Diagnosis not present

## 2019-01-11 DIAGNOSIS — Z3A28 28 weeks gestation of pregnancy: Secondary | ICD-10-CM

## 2019-01-11 NOTE — Progress Notes (Signed)
   PRENATAL VISIT NOTE  Subjective:  Tiffany Wong is a 27 y.o. G4P1021 at [redacted]w[redacted]d being seen today for ongoing prenatal care.  She is currently monitored for the following issues for this low-risk pregnancy and has Bipolar affective disorder (HCC); HSV-2 infection; Tobacco abuse; Borderline personality disorder (HCC); Supervision of other normal pregnancy, antepartum; and ASCUS with positive high risk HPV cervical on their problem list.  Patient reports no complaints.  Contractions: Irritability. Vag. Bleeding: None, Bloody Show.  Movement: Present. Denies leaking of fluid.   The following portions of the patient's history were reviewed and updated as appropriate: allergies, current medications, past family history, past medical history, past social history, past surgical history and problem list.   Objective:   Vitals:   01/11/19 1001  BP: 104/71  Pulse: 94  Weight: 118 lb 11.2 oz (53.8 kg)    Fetal Status:     Movement: Present     General:  Alert, oriented and cooperative. Patient is in no acute distress.  Skin: Skin is warm and dry. No rash noted.   Cardiovascular: Normal heart rate noted  Respiratory: Normal respiratory effort, no problems with respiration noted  Abdomen: Soft, gravid, appropriate for gestational age.  Pain/Pressure: Present     Pelvic: Speculum exam reveals a closed cervix and no bleeding  Extremities: Normal range of motion.  Edema: None  Mental Status: Normal mood and affect. Normal behavior. Normal judgment and thought content.   Assessment and Plan:  Pregnancy: G4P1021 at [redacted]w[redacted]d 1. Supervision of other normal pregnancy, antepartum - She is moving to [place] to live with her mom due to the pandemic - Glucose Tolerance, 2 Hours w/1 Hour - CBC - RPR - HIV Antibody (routine testing w rflx) - Tdap vaccine greater than or equal to 7yo IM  Preterm labor symptoms and general obstetric precautions including but not limited to vaginal bleeding,  contractions, leaking of fluid and fetal movement were reviewed in detail with the patient. Please refer to After Visit Summary for other counseling recommendations.   No follow-ups on file.  Future Appointments  Date Time Provider Department Center  01/11/2019 10:45 AM Allie Bossier, MD [facility] None    Allie Bossier, MD

## 2019-01-11 NOTE — Progress Notes (Signed)
Pt is here for ROB/2hrGTT. [redacted]w[redacted]d. Pt reports light vaginal spotting, IC last night.

## 2019-01-12 LAB — GLUCOSE TOLERANCE, 2 HOURS W/ 1HR
Glucose, 1 hour: 120 mg/dL (ref 65–179)
Glucose, 2 hour: 96 mg/dL (ref 65–152)
Glucose, Fasting: 82 mg/dL (ref 65–91)

## 2019-01-12 LAB — CBC
Hematocrit: 32.8 % — ABNORMAL LOW (ref 34.0–46.6)
Hemoglobin: 11.4 g/dL (ref 11.1–15.9)
MCH: 30.8 pg (ref 26.6–33.0)
MCHC: 34.8 g/dL (ref 31.5–35.7)
MCV: 89 fL (ref 79–97)
Platelets: 200 10*3/uL (ref 150–450)
RBC: 3.7 x10E6/uL — ABNORMAL LOW (ref 3.77–5.28)
RDW: 12.5 % (ref 11.7–15.4)
WBC: 11.9 10*3/uL — ABNORMAL HIGH (ref 3.4–10.8)

## 2019-01-12 LAB — RPR: RPR Ser Ql: NONREACTIVE

## 2019-01-12 LAB — HIV ANTIBODY (ROUTINE TESTING W REFLEX): HIV Screen 4th Generation wRfx: NONREACTIVE

## 2019-02-02 ENCOUNTER — Encounter: Payer: Self-pay | Admitting: Obstetrics & Gynecology

## 2019-02-02 ENCOUNTER — Ambulatory Visit (INDEPENDENT_AMBULATORY_CARE_PROVIDER_SITE_OTHER): Payer: Medicaid Other | Admitting: Obstetrics & Gynecology

## 2019-02-02 ENCOUNTER — Other Ambulatory Visit: Payer: Self-pay

## 2019-02-02 DIAGNOSIS — O99343 Other mental disorders complicating pregnancy, third trimester: Secondary | ICD-10-CM | POA: Diagnosis not present

## 2019-02-02 DIAGNOSIS — B009 Herpesviral infection, unspecified: Secondary | ICD-10-CM

## 2019-02-02 DIAGNOSIS — Z3480 Encounter for supervision of other normal pregnancy, unspecified trimester: Secondary | ICD-10-CM

## 2019-02-02 DIAGNOSIS — F603 Borderline personality disorder: Secondary | ICD-10-CM | POA: Diagnosis not present

## 2019-02-02 DIAGNOSIS — Z3A31 31 weeks gestation of pregnancy: Secondary | ICD-10-CM | POA: Diagnosis not present

## 2019-02-02 MED ORDER — VALACYCLOVIR HCL 1 G PO TABS: 1000.0000 mg | ORAL_TABLET | Freq: Every day | ORAL | 11 refills | Status: DC

## 2019-02-02 NOTE — Progress Notes (Signed)
   TELEHEALTH VIRTUAL OBSTETRICS VISIT ENCOUNTER NOTE  I connected with Tiffany Wong on 02/02/19 at  9:55 AM EDT by telephone at home and verified that I am speaking with the correct person using two identifiers.   I discussed the limitations, risks, security and privacy concerns of performing an evaluation and management service by telephone and the availability of in person appointments. I also discussed with the patient that there may be a patient responsible charge related to this service. The patient expressed understanding and agreed to proceed.  Subjective:  Tiffany Wong is a 28 y.o. G4P1021 at [redacted]w[redacted]d being followed for ongoing prenatal care.  She is currently monitored for the following issues for this low-risk pregnancy and has Bipolar affective disorder (HCC); HSV-2 infection; Tobacco abuse; Borderline personality disorder (HCC); Supervision of other normal pregnancy, antepartum; and ASCUS with positive high risk HPV cervical on their problem list.  Patient reports no complaints. Reports fetal movement. Denies any contractions, bleeding or leaking of fluid.   The following portions of the patient's history were reviewed and updated as appropriate: allergies, current medications, past family history, past medical history, past social history, past surgical history and problem list.   Objective:   General:  Alert, oriented and cooperative.   Mental Status: Normal mood and affect perceived. Normal judgment and thought content.  Rest of physical exam deferred due to type of encounter  Assessment and Plan:  Pregnancy: G4P1021 at [redacted]w[redacted]d 1. Supervision of other normal pregnancy, antepartum - She has relocated to [place], living with her mother, plans to deliver in [place], has found a provider there, has an appt next week there  2. Borderline personality disorder (HCC)  3. HSV-2 infection - daily treatment starting at 36 weeks  - valtex reordered  Preterm labor symptoms  and general obstetric precautions including but not limited to vaginal bleeding, contractions, leaking of fluid and fetal movement were reviewed in detail with the patient.  I discussed the assessment and treatment plan with the patient. The patient was provided an opportunity to ask questions and all were answered. The patient agreed with the plan and demonstrated an understanding of the instructions. The patient was advised to call back or seek an in-person office evaluation/go to MAU at [facility] for any urgent or concerning symptoms. Please refer to After Visit Summary for other counseling recommendations.   I provided 8 minutes of non-face-to-face time during this encounter.  Return in about 2 weeks (around 02/16/2019) for tele visit.  No future appointments.  Allie Bossier, MD Center for [employer], [facility]

## 2019-02-02 NOTE — Progress Notes (Signed)
CC: Braxton Hicks Contractions

## 2019-02-05 DIAGNOSIS — A6000 Herpesviral infection of urogenital system, unspecified: Secondary | ICD-10-CM | POA: Insufficient documentation

## 2019-02-05 DIAGNOSIS — Z87898 Personal history of other specified conditions: Secondary | ICD-10-CM | POA: Insufficient documentation

## 2019-06-24 ENCOUNTER — Encounter (HOSPITAL_COMMUNITY): Payer: Self-pay

## 2019-10-29 DIAGNOSIS — F3162 Bipolar disorder, current episode mixed, moderate: Secondary | ICD-10-CM | POA: Insufficient documentation

## 2020-02-04 ENCOUNTER — Other Ambulatory Visit: Payer: Self-pay

## 2020-02-04 ENCOUNTER — Encounter (HOSPITAL_COMMUNITY): Payer: Self-pay | Admitting: Emergency Medicine

## 2020-02-04 ENCOUNTER — Emergency Department (HOSPITAL_COMMUNITY)
Admission: EM | Admit: 2020-02-04 | Discharge: 2020-02-05 | Disposition: A | Discharge status: Other Acute Inpatient Hospital | Payer: No Typology Code available for payment source | Attending: Emergency Medicine | Admitting: Emergency Medicine

## 2020-02-04 DIAGNOSIS — R45851 Suicidal ideations: Secondary | ICD-10-CM | POA: Insufficient documentation

## 2020-02-04 DIAGNOSIS — Z20822 Contact with and (suspected) exposure to covid-19: Secondary | ICD-10-CM | POA: Insufficient documentation

## 2020-02-04 DIAGNOSIS — F603 Borderline personality disorder: Secondary | ICD-10-CM | POA: Diagnosis present

## 2020-02-04 DIAGNOSIS — F17210 Nicotine dependence, cigarettes, uncomplicated: Secondary | ICD-10-CM | POA: Insufficient documentation

## 2020-02-04 LAB — COMPREHENSIVE METABOLIC PANEL
ALT: 15 U/L (ref 0–44)
AST: 22 U/L (ref 15–41)
Albumin: 4.2 g/dL (ref 3.5–5.0)
Alkaline Phosphatase: 76 U/L (ref 38–126)
Anion gap: 10 (ref 5–15)
BUN: 8 mg/dL (ref 6–20)
CO2: 23 mmol/L (ref 22–32)
Calcium: 8.9 mg/dL (ref 8.9–10.3)
Chloride: 107 mmol/L (ref 98–111)
Creatinine, Ser: 0.62 mg/dL (ref 0.44–1.00)
GFR calc Af Amer: >60 mL/min (ref >60)
GFR calc non Af Amer: >60 mL/min (ref >60)
Glucose, Bld: 106 mg/dL — ABNORMAL HIGH (ref 70–99)
Potassium: 3.3 mmol/L — ABNORMAL LOW (ref 3.5–5.1)
Sodium: 140 mmol/L (ref 135–145)
Total Bilirubin: 0.6 mg/dL (ref 0.3–1.2)
Total Protein: 7.4 g/dL (ref 6.5–8.1)

## 2020-02-04 LAB — RESPIRATORY PANEL BY RT PCR (FLU A&B, COVID)
Influenza A by PCR: NEGATIVE
Influenza B by PCR: NEGATIVE
SARS Coronavirus 2 by RT PCR: NEGATIVE

## 2020-02-04 LAB — ETHANOL: Alcohol, Ethyl (B): <10 mg/dL (ref <10)

## 2020-02-04 LAB — CBC
HCT: 38.6 % (ref 36.0–46.0)
Hemoglobin: 12.6 g/dL (ref 12.0–15.0)
MCH: 29.6 pg (ref 26.0–34.0)
MCHC: 32.6 g/dL (ref 30.0–36.0)
MCV: 90.8 fL (ref 80.0–100.0)
Platelets: 251 10*3/uL (ref 150–400)
RBC: 4.25 MIL/uL (ref 3.87–5.11)
RDW: 12.8 % (ref 11.5–15.5)
WBC: 8 10*3/uL (ref 4.0–10.5)
nRBC: 0 % (ref 0.0–0.2)

## 2020-02-04 LAB — ACETAMINOPHEN LEVEL: Acetaminophen (Tylenol), Serum: <10 ug/mL — ABNORMAL LOW (ref 10–30)

## 2020-02-04 LAB — SALICYLATE LEVEL: Salicylate Lvl: <7 mg/dL — ABNORMAL LOW (ref 7.0–30.0)

## 2020-02-04 LAB — I-STAT BETA HCG BLOOD, ED (MC, WL, AP ONLY)
I-stat hCG, quantitative: <5 m[IU]/mL (ref <5)
I-stat hCG, quantitative: <5 m[IU]/mL (ref <5)

## 2020-02-04 MED ORDER — LORAZEPAM 2 MG/ML IJ SOLN
2.0000 mg | Freq: Once | INTRAMUSCULAR | Status: AC
  Administered 2020-02-04: 16:00:00 2 mg via INTRAMUSCULAR
  Filled 2020-02-04: qty 1

## 2020-02-04 MED ORDER — POTASSIUM CHLORIDE CRYS ER 20 MEQ PO TBCR
40.0000 meq | EXTENDED_RELEASE_TABLET | Freq: Once | ORAL | Status: AC
  Administered 2020-02-04: 40 meq via ORAL
  Filled 2020-02-04: qty 2

## 2020-02-04 NOTE — ED Notes (Signed)
Pt in obvious distress; not being directly verbally abusive towards staff or security but is shouting and is in obvious mental distress.
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
## 2020-02-04 NOTE — ED Notes (Signed)
Lunch Tray Ordered @ 1737. 

## 2020-02-04 NOTE — ED Notes (Signed)
This RN called staffing for a sitter; none are available at the moment; this RN will reassess this pt as frequently as possible. Harmful objects removed from room. Charge RN made aware.
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
## 2020-02-04 NOTE — ED Notes (Signed)
TTS at Bedside 

## 2020-02-04 NOTE — Progress Notes (Signed)
Patient meets inpatient criteria per Assunta Found, NP. Patient has been faxed out to the following facilities for review:   [facility]  [facility]  [facility]  [facility]  [facility]  [facility]  CSW will continue to follow and assist with securing bed placement.   Drucilla Schmidt, MSW, LCSW-A Clinical Disposition Social Worker [facility]/TTS [phone]

## 2020-02-05 ENCOUNTER — Encounter (HOSPITAL_COMMUNITY): Payer: Self-pay | Admitting: Behavioral Health

## 2020-02-05 ENCOUNTER — Inpatient Hospital Stay (HOSPITAL_COMMUNITY)
Admission: AD | Admit: 2020-02-05 | Discharge: 2020-02-08 | DRG: 885 | Disposition: A | Discharge status: Home / Self Care | Payer: No Typology Code available for payment source | Source: Intra-hospital | Attending: Psychiatry | Admitting: Psychiatry

## 2020-02-05 DIAGNOSIS — Z886 Allergy status to analgesic agent status: Secondary | ICD-10-CM

## 2020-02-05 DIAGNOSIS — Z801 Family history of malignant neoplasm of trachea, bronchus and lung: Secondary | ICD-10-CM | POA: Diagnosis not present

## 2020-02-05 DIAGNOSIS — Z590 Homelessness: Secondary | ICD-10-CM

## 2020-02-05 DIAGNOSIS — F17210 Nicotine dependence, cigarettes, uncomplicated: Secondary | ICD-10-CM | POA: Diagnosis present

## 2020-02-05 DIAGNOSIS — F603 Borderline personality disorder: Secondary | ICD-10-CM | POA: Diagnosis not present

## 2020-02-05 DIAGNOSIS — F4310 Post-traumatic stress disorder, unspecified: Secondary | ICD-10-CM | POA: Diagnosis present

## 2020-02-05 DIAGNOSIS — F411 Generalized anxiety disorder: Secondary | ICD-10-CM | POA: Diagnosis present

## 2020-02-05 DIAGNOSIS — Z6372 Alcoholism and drug addiction in family: Secondary | ICD-10-CM | POA: Diagnosis not present

## 2020-02-05 DIAGNOSIS — K219 Gastro-esophageal reflux disease without esophagitis: Secondary | ICD-10-CM | POA: Diagnosis present

## 2020-02-05 DIAGNOSIS — F3160 Bipolar disorder, current episode mixed, unspecified: Secondary | ICD-10-CM | POA: Diagnosis present

## 2020-02-05 DIAGNOSIS — Z8249 Family history of ischemic heart disease and other diseases of the circulatory system: Secondary | ICD-10-CM

## 2020-02-05 DIAGNOSIS — F319 Bipolar disorder, unspecified: Secondary | ICD-10-CM | POA: Diagnosis not present

## 2020-02-05 DIAGNOSIS — R45851 Suicidal ideations: Secondary | ICD-10-CM | POA: Diagnosis present

## 2020-02-05 DIAGNOSIS — Z8349 Family history of other endocrine, nutritional and metabolic diseases: Secondary | ICD-10-CM

## 2020-02-05 DIAGNOSIS — T1490XA Injury, unspecified, initial encounter: Secondary | ICD-10-CM

## 2020-02-05 LAB — RAPID URINE DRUG SCREEN, HOSP PERFORMED
Amphetamines: NOT DETECTED
Barbiturates: NOT DETECTED
Benzodiazepines: POSITIVE — AB
Cocaine: NOT DETECTED
Opiates: NOT DETECTED
Tetrahydrocannabinol: POSITIVE — AB

## 2020-02-05 MED ORDER — QUETIAPINE FUMARATE 50 MG PO TABS: 50.0000 mg | ORAL_TABLET | Freq: Every evening | ORAL | Status: DC | PRN

## 2020-02-05 MED ORDER — IBUPROFEN 400 MG PO TABS
600.0000 mg | ORAL_TABLET | Freq: Four times a day (QID) | ORAL | Status: DC | PRN
  Administered 2020-02-05: 600 mg via ORAL
  Filled 2020-02-05: qty 1

## 2020-02-05 MED ORDER — PANTOPRAZOLE SODIUM 40 MG PO TBEC
40.0000 mg | DELAYED_RELEASE_TABLET | Freq: Every day | ORAL | Status: DC
  Administered 2020-02-05 – 2020-02-08 (×4): 40 mg via ORAL
  Filled 2020-02-05 (×7): qty 1

## 2020-02-05 MED ORDER — HYDROXYZINE HCL 25 MG PO TABS: 25.0000 mg | ORAL_TABLET | ORAL | Status: DC | PRN

## 2020-02-05 MED ORDER — ALUM & MAG HYDROXIDE-SIMETH 200-200-20 MG/5ML PO SUSP: 30.0000 mL | Freq: Once | ORAL | Status: DC

## 2020-02-05 MED ORDER — NICOTINE 21 MG/24HR TD PT24
21.0000 mg | MEDICATED_PATCH | Freq: Every day | TRANSDERMAL | Status: DC
  Administered 2020-02-05 – 2020-02-08 (×4): 21 mg via TRANSDERMAL
  Filled 2020-02-05 (×6): qty 1

## 2020-02-05 MED ORDER — POTASSIUM CHLORIDE CRYS ER 20 MEQ PO TBCR
20.0000 meq | EXTENDED_RELEASE_TABLET | Freq: Once | ORAL | Status: AC
  Administered 2020-02-05: 15:00:00 20 meq via ORAL
  Filled 2020-02-05: qty 1

## 2020-02-05 MED ORDER — LIDOCAINE VISCOUS HCL 2 % MT SOLN
15.0000 mL | Freq: Once | OROMUCOSAL | Status: AC
  Administered 2020-02-05: 15:00:00 15 mL via ORAL
  Filled 2020-02-05: qty 15

## 2020-02-05 MED ORDER — ACETAMINOPHEN 325 MG PO TABS
650.0000 mg | ORAL_TABLET | Freq: Four times a day (QID) | ORAL | Status: DC | PRN
  Administered 2020-02-05: 650 mg via ORAL
  Filled 2020-02-05: qty 2

## 2020-02-05 MED ORDER — HYDROXYZINE HCL 50 MG PO TABS
50.0000 mg | ORAL_TABLET | Freq: Every evening | ORAL | Status: DC | PRN
  Administered 2020-02-05 – 2020-02-07 (×6): 50 mg via ORAL
  Filled 2020-02-05 (×10): qty 1

## 2020-02-05 MED ORDER — LORAZEPAM 0.5 MG PO TABS
0.5000 mg | ORAL_TABLET | Freq: Four times a day (QID) | ORAL | Status: DC | PRN
  Administered 2020-02-07: 0.5 mg via ORAL
  Filled 2020-02-05: qty 1

## 2020-02-05 MED ORDER — MAGNESIUM HYDROXIDE 400 MG/5ML PO SUSP: 15.0000 mL | Freq: Every evening | ORAL | Status: DC | PRN

## 2020-02-05 MED ORDER — VALACYCLOVIR HCL 500 MG PO TABS
1000.0000 mg | ORAL_TABLET | Freq: Every day | ORAL | Status: DC
  Administered 2020-02-05 – 2020-02-06 (×2): 1000 mg via ORAL
  Filled 2020-02-05 (×4): qty 2

## 2020-02-05 MED ORDER — BUSPIRONE HCL 5 MG PO TABS
5.0000 mg | ORAL_TABLET | Freq: Three times a day (TID) | ORAL | Status: DC
  Administered 2020-02-05 – 2020-02-06 (×2): 5 mg via ORAL
  Filled 2020-02-05 (×6): qty 1

## 2020-02-05 MED ORDER — ENSURE ENLIVE PO LIQD
237.0000 mL | Freq: Two times a day (BID) | ORAL | Status: DC
  Administered 2020-02-05 – 2020-02-07 (×5): 237 mL via ORAL

## 2020-02-05 NOTE — Tx Team (Signed)
Initial Treatment Plan 02/05/2020 2:13 PM Tiffany Budge LPN:[id number]    PATIENT STRESSORS: Financial difficulties Health problems Marital or family conflict   PATIENT STRENGTHS: Ability for insight Average or above average intelligence Communication skills Motivation for treatment/growth   PATIENT IDENTIFIED PROBLEMS: "Mood stabilized"  "Better coping skills"  Suicidal Ideation  Depression               DISCHARGE CRITERIA:  Ability to meet basic life and health needs Motivation to continue treatment in a less acute level of care  PRELIMINARY DISCHARGE PLAN: Attend aftercare/continuing care group Outpatient therapy Placement in alternative living arrangements  PATIENT/FAMILY INVOLVEMENT: This treatment plan has been presented to and reviewed with the patient, Tiffany Wong, and/or family member.  The patient and family have been given the opportunity to ask questions and make suggestions.  Clarene Critchley, RN 02/05/2020, 2:13 PM

## 2020-02-05 NOTE — ED Notes (Signed)
Lactation consult at bedside to assess and providing a breast pump for patient-Monique,RN
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
## 2020-02-05 NOTE — ED Notes (Addendum)
Pt noted to be tearful after phone conversation. Pt aware of tx plan - Accepted to [facility] - [phone] - voiced understanding and agreement. States "that's where I want to go".

## 2020-02-05 NOTE — ED Notes (Signed)
Patient came out the room c/o breast pain and obvious signs of breast engorgement; pt states she has stated all day that she has a 29 month old at home who is still breasting feeding; pt is seen actively pressing milk out into a cup; This RN consulted with woman's to get breast pump;Jessica with Mother baby unit put in for lactation to come to see patient and bring pump-Monique,RN

## 2020-02-05 NOTE — Progress Notes (Signed)
Recreation Therapy Notes  Animal-Assisted Activity (AAA) Program Checklist/Progress Notes Patient Eligibility Criteria Checklist & Daily Group note for Rec Tx Intervention  Date: 4.20.21 Time: 1430 Location: [address]   AAA/T Program Assumption of Risk Form signed by Engineer, production or Parent Legal Guardian  YES   Patient is free of allergies or sever asthma  YES   Patient reports no fear of animals  YES   Patient reports no history of cruelty to animals  YES   Patient understands his/her participation is voluntary  YES  Patient washes hands before animal contact  YES   Patient washes hands after animal contact  YES   Behavioral Response: Engaged  Education: Charity fundraiser, Appropriate Animal Interaction   Education Outcome: Acknowledges understanding/In group clarification offered/Needs additional education.   Clinical Observations/Feedback: Pt attended and participated in activity.    Caroll Rancher, LRT/CTRS         Caroll Rancher A 02/05/2020 3:33 PM
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
## 2020-02-05 NOTE — Progress Notes (Signed)
Pt accepted to [facility]; bed 304-1   Denzil Magnuson, NP is the accepting provider.    Dr. Jola Babinski is the attending provider.    Call report to [phone]    Pt is scheduled to arrive at [facility] at 1130am.   Wells Guiles, LCSW, LCAS Disposition CSW [facility] B[facility]/TTS [phone]

## 2020-02-05 NOTE — Progress Notes (Signed)
Patient states that she had a good day today since she was able to get plenty of rest. She states that she normally gets very little rest since she is constantly pumping and or taking care of her infant and young child. Her goal for tomorrow is to not set any goals and just "let it be".

## 2020-02-05 NOTE — ED Notes (Addendum)
Pt signed consent form - Copy faxed to [facility] - Copy sent to Medical Records - Original placed in envelope for [facility]. ALL belongings - 1 labeled belongings bag and 1 valuables envelope - Safe Transportation - Along w/Breast Pump and supplies from [employer] - Pt aware.

## 2020-02-05 NOTE — Lactation Note (Addendum)
Lactation Consultation Note Mom has been in ER all day. Mom states she is currently BF her 79 month old daughter. Mom states she was in pain and engorged. Mom stated before [place] came she hand expressed big cup full and discarded it in the sink. LC brought DEBP, kit, basin, soap,  And 2 packs of bottles for milk storage. Set up pump. Reviewed milk storage w/mom and RN. Gave RN personal belonging bag that [place] brought supplies in. Demonstrated to mom how to use pump. Mom thanks [facility] for bringing pump. LC felt some knots, breast tissue soft since mom hand expressed per mom. RN told LC mom's breast were full of knots. Mom states her breast feels so much better.  Patient Name: Tiffany Wong NTIRW'E Date: 02/05/2020     Maternal Data    Feeding    LATCH Score                   Interventions    Lactation Tools Discussed/Used  DEBP   Consult Status  complete    Tiffany Wong 02/05/2020, 4:00 AM
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
## 2020-02-05 NOTE — ED Notes (Signed)
ED Provider at bedside. 
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
## 2020-02-06 ENCOUNTER — Ambulatory Visit (HOSPITAL_COMMUNITY)
Admit: 2020-02-06 | Discharge: 2020-02-06 | Disposition: A | Discharge status: Home / Self Care | Payer: No Typology Code available for payment source | Attending: Psychiatry | Admitting: Psychiatry

## 2020-02-06 LAB — TSH
TSH: 1.299 u[IU]/mL (ref 0.350–4.500)
TSH: 0.989 u[IU]/mL (ref 0.350–4.500)

## 2020-02-06 LAB — HEMOGLOBIN A1C
Hgb A1c MFr Bld: 5.4 % (ref 4.8–5.6)
Mean Plasma Glucose: 108.28 mg/dL

## 2020-02-06 LAB — LIPID PANEL
Cholesterol: 143 mg/dL (ref 0–200)
HDL: 55 mg/dL (ref >40)
LDL Cholesterol: 78 mg/dL (ref 0–99)
Total CHOL/HDL Ratio: 2.6 RATIO
Triglycerides: 50 mg/dL (ref <150)
VLDL: 10 mg/dL (ref 0–40)

## 2020-02-06 MED ORDER — GABAPENTIN 100 MG PO CAPS
200.0000 mg | ORAL_CAPSULE | Freq: Every day | ORAL | Status: DC
  Administered 2020-02-06: 200 mg via ORAL
  Filled 2020-02-06 (×3): qty 2

## 2020-02-06 MED ORDER — LAMOTRIGINE 25 MG PO TABS
25.0000 mg | ORAL_TABLET | Freq: Every day | ORAL | Status: DC
  Administered 2020-02-06 – 2020-02-08 (×3): 25 mg via ORAL
  Filled 2020-02-06 (×6): qty 1

## 2020-02-06 MED ORDER — ADULT MULTIVITAMIN W/MINERALS CH
1.0000 | ORAL_TABLET | Freq: Every day | ORAL | Status: DC
  Administered 2020-02-06 – 2020-02-08 (×3): 1 via ORAL
  Filled 2020-02-06 (×5): qty 1

## 2020-02-06 MED ORDER — VALACYCLOVIR HCL 500 MG PO TABS
1000.0000 mg | ORAL_TABLET | Freq: Every day | ORAL | Status: DC
  Administered 2020-02-06 – 2020-02-08 (×3): 1000 mg via ORAL
  Filled 2020-02-06 (×4): qty 2

## 2020-02-06 MED ORDER — GABAPENTIN 100 MG PO CAPS
100.0000 mg | ORAL_CAPSULE | Freq: Two times a day (BID) | ORAL | Status: DC
  Administered 2020-02-06 – 2020-02-07 (×2): 100 mg via ORAL
  Filled 2020-02-06 (×5): qty 1

## 2020-02-06 MED ORDER — BUSPIRONE HCL 10 MG PO TABS
10.0000 mg | ORAL_TABLET | Freq: Three times a day (TID) | ORAL | Status: DC
  Administered 2020-02-06 – 2020-02-08 (×6): 10 mg via ORAL
  Filled 2020-02-06 (×9): qty 1

## 2020-02-06 NOTE — H&P (Signed)
NUTRITION ASSESSMENT  Pt identified as at risk on the Malnutrition Screen Tool  INTERVENTION: Continue Ensure Enlive po BID, each supplement provides 350 kcal and 20 grams of protein  MVI with minerals daily  NUTRITION DIAGNOSIS: Increased nutrient needs related to breast feeding for 75 month old daughter as evidenced by estimated needs.   Goal: Pt to meet >/= 90% of their estimated nutrition needs.  Monitor:  PO intake  Assessment:  RD working remotely.  29 y.o. female with past psychiatric history reportedly for bipolar disorder and concern for postpartum depression presented to [facility] ED with worsening depression and suicidal ideation admitted to [facility] for evaluation and stabilization.  Per chart patient has a 77 month old child that she is currently breastfeeding. Per tele assessment note, pt reports poor appetite and sleep, isolating and reports feeling hopeless.   No recent wt history available for review, noted 118 lbs in March 2020 and she currently weighs 95 lbs. Patient is on a regular diet and is provided Ensure supplement BID to aid with needs per medication review. Will order MVI with minerals daily.   Height: Ht Readings from Last 1 Encounters:  02/05/20 5\' 1"  (1.549 m)    Weight: Wt Readings from Last 1 Encounters:  02/05/20 43.1 kg    Weight Hx: Wt Readings from Last 10 Encounters:  02/05/20 43.1 kg  01/11/19 53.8 kg  11/30/18 52.6 kg  11/25/18 54.5 kg  11/22/18 54 kg  11/02/18 48.1 kg  10/17/18 47.7 kg  10/12/18 48.5 kg  10/04/18 47.6 kg  09/06/18 44.2 kg    BMI:  Body mass index is 17.95 kg/m. Pt meets criteria for underweight based on current BMI.  Estimated Nutritional Needs: Kcal: 30-35 kcal/kg Protein: > 1 gram protein/kg Fluid: 1 ml/kcal  Diet Order:  Diet Order            Diet regular Room service appropriate? No; Fluid consistency: Thin; Fluid restriction: 2000 mL Fluid  Diet effective now             Pt is also offered choice of  unit snacks mid-morning and mid-afternoon.  Pt is eating as desired.   Lab results and medications reviewed.   09/08/18, RD, LDN Clinical Nutrition After Hours/Weekend Pager # in Amion

## 2020-02-06 NOTE — BHH Group Notes (Signed)
LCSW Group Therapy Note  Type of Therapy/Topic: Group Therapy: Six Dimensions of Wellness  Participation Level: Active  Description of Group: This group will address the concept of wellness and the six concepts of wellness: occupational, physical, social, intellectual, spiritual, and emotional. Patients will be encouraged to process areas in their lives that are out of balance and identify reasons for remaining unbalanced. Patients will be encouraged to explore ways to practice healthy habits on a daily basis to attain better physical and mental health outcomes.  Therapeutic Goals: 1. Identify aspects of wellness that they are doing well. 2. Identify aspects of wellness that they would like to improve upon. 3. Identify one action they can take to improve an aspect of wellness in their lives.    Summary of Patient Progress: Janiyla was an active participant throughout group and offered support and encouragement to her peers. Elene shared that she is doing well in regard to intellectual and spiritual aspects of her wellness. Caren would like to work on her emotional and occupational well being. Isa spoke about the importance of approaching situations with a positive outlook.  Therapeutic Modalities: Cognitive Behavioral Therapy Solution-Focused Therapy Relapse Prevention

## 2020-02-06 NOTE — Plan of Care (Signed)
Nurse discussed anxiety, depression and coping skills with patient.  

## 2020-02-06 NOTE — Progress Notes (Signed)
Patient will be transported via [employer] [phone] to [facility].  Spoke to [place] at [phone].  Appt scheduled at 12:30 but there may be a wait time.

## 2020-02-06 NOTE — Progress Notes (Signed)
Recreation Therapy Notes  Date:  4.21.21 Time: 0930 Location: [address] Room  Group Topic: Stress Management  Goal Area(s) Addresses:  Patient will identify positive stress management techniques. Patient will identify benefits of using stress management post d/c.  Behavioral Response: Engaged  Intervention: Stress Management  Activity :  Guided Imagery.  LRT read a script that took patients on a walk along the beach.  Patients were to listen and follow along as script was read to engage in activity.  Education:  Stress Management, Discharge Planning.   Education Outcome: Acknowledges Education  Clinical Observations/Feedback: Pt attended and participated in activity.    Caroll Rancher, LRT/CTRS         Lillia Abed, Jannessa Ogden A 02/06/2020 11:13 AM

## 2020-02-06 NOTE — Progress Notes (Signed)
   02/05/20 2000  Psych Admission Type (Psych Patients Only)  Admission Status Voluntary  Psychosocial Assessment  Patient Complaints None  Eye Contact Fair  Facial Expression Anxious  Affect Depressed;Sad  Speech Soft;Logical/coherent  Interaction Assertive  Motor Activity Other (Comment) (WNL)  Appearance/Hygiene Unremarkable  Behavior Characteristics Cooperative  Mood Depressed;Sad  Thought Process  Coherency WDL  Content WDL  Delusions None reported or observed  Perception WDL  Hallucination None reported or observed  Judgment Poor  Confusion None  Danger to Self  Current suicidal ideation? Denies  Danger to Others  Danger to Others None reported or observed   Pt seen at med window. Pt still pumping breast milk. Had question because she noticed milk had a bluish tint and wanted to know if any of her medicines could cause that. She wants to know if there is any issue with her baby drinking her breast milk with her current medications. Told pt would leave a sticky note for provider. Pt seems anxious and depressed but pleasant.

## 2020-02-06 NOTE — Progress Notes (Signed)
Patient has saved several samples of breast milk, stated it was light blue.

## 2020-02-06 NOTE — Tx Team (Signed)
Interdisciplinary Treatment and Diagnostic Plan Update  02/06/2020 Time of Session: 9:30am Tiffany Wong MRN: [id number]  Principal Diagnosis: <principal problem not specified>  Secondary Diagnoses: Active Problems:   Bipolar 1 disorder (HCC)   Current Medications:  Current Facility-Administered Medications  Medication Dose Route Frequency Provider Last Rate Last Admin  . acetaminophen (TYLENOL) tablet 650 mg  650 mg Oral Q6H PRN Sharma Covert, MD   650 mg at 02/05/20 2000  . alum & mag hydroxide-simeth (MAALOX/MYLANTA) 200-200-20 MG/5ML suspension 30 mL  30 mL Oral Once Sharma Covert, MD      . busPIRone (BUSPAR) tablet 10 mg  10 mg Oral TID Sharma Covert, MD      . feeding supplement (ENSURE ENLIVE) (ENSURE ENLIVE) liquid 237 mL  237 mL Oral BID BM Sharma Covert, MD   237 mL at 02/05/20 1528  . hydrOXYzine (ATARAX/VISTARIL) tablet 25 mg  25 mg Oral Q4H PRN Sharma Covert, MD      . hydrOXYzine (ATARAX/VISTARIL) tablet 50 mg  50 mg Oral QHS,MR X 1 Sharma Covert, MD   50 mg at 02/05/20 2148  . LORazepam (ATIVAN) tablet 0.5 mg  0.5 mg Oral Q6H PRN Sharma Covert, MD      . magnesium hydroxide (MILK OF MAGNESIA) suspension 15 mL  15 mL Oral QHS PRN Sharma Covert, MD      . nicotine (NICODERM CQ - dosed in mg/24 hours) patch 21 mg  21 mg Transdermal Daily Sharma Covert, MD   21 mg at 02/06/20 0800  . pantoprazole (PROTONIX) EC tablet 40 mg  40 mg Oral Daily Sharma Covert, MD   40 mg at 02/06/20 0802  . valACYclovir (VALTREX) tablet 1,000 mg  1,000 mg Oral Daily Sharma Covert, MD       PTA Medications: Medications Prior to Admission  Medication Sig Dispense Refill Last Dose  . acetaminophen (TYLENOL) 500 MG tablet Take 1,000 mg by mouth every 6 (six) hours as needed for mild pain.     . Elastic Bandages & Supports (COMFORT FIT MATERNITY SUPP MED) MISC 1 Device by Does not apply route daily. 1 each 0   . Prenatal Vit-Fe Fumarate-FA  (PRENATAL MULTIVITAMIN) TABS tablet Take 1 tablet by mouth daily at 12 noon.     Marland Kitchen terconazole (TERAZOL 7) 0.4 % vaginal cream Place 1 applicator vaginally at bedtime. (Patient not taking: Reported on 02/04/2020) 45 g 0   . terconazole (TERAZOL 7) 0.4 % vaginal cream Place 1 applicator vaginally at bedtime. (Patient not taking: Reported on 01/11/2019) 45 g 0   . tinidazole (TINDAMAX) 500 MG tablet Take 2 tablets (1,000 mg total) by mouth daily with breakfast. (Patient not taking: Reported on 01/11/2019) 10 tablet 2   . valACYclovir (VALTREX) 1000 MG tablet Take 1 tablet (1,000 mg total) by mouth daily. (Patient taking differently: Take 1,000 mg by mouth daily as needed (for outbreak). 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   Physician Treatment Plan for Primary Diagnosis: <principal problem not specified> Long Term Goal(s): Improvement in symptoms so as ready for discharge Improvement in symptoms so as ready for discharge   Short Term Goals: Ability to identify changes in lifestyle  to reduce recurrence of condition will improve Ability to verbalize feelings will improve Ability to disclose and discuss suicidal ideas Ability to demonstrate self-control will improve Ability to identify and develop effective coping behaviors will improve Ability to maintain clinical measurements within normal limits will improve Compliance with prescribed medications will improve Ability to identify changes in lifestyle to reduce recurrence of condition will improve Ability to verbalize feelings will improve Ability to disclose and discuss suicidal ideas Ability to  demonstrate self-control will improve Ability to identify and develop effective coping behaviors will improve Ability to maintain clinical measurements within normal limits will improve Compliance with prescribed medications will improve  Medication Management: Evaluate patient's response, side effects, and tolerance of medication regimen.  Therapeutic Interventions: 1 to 1 sessions, Unit Group sessions and Medication administration.  Evaluation of Outcomes: Not Met  Physician Treatment Plan for Secondary Diagnosis: Active Problems:   Bipolar 1 disorder ([place])  Long Term Goal(s): Improvement in symptoms so as ready for discharge Improvement in symptoms so as ready for discharge   Short Term Goals: Ability to identify changes in lifestyle to reduce recurrence of condition will improve Ability to verbalize feelings will improve Ability to disclose and discuss suicidal ideas Ability to demonstrate self-control will improve Ability to identify and develop effective coping behaviors will improve Ability to maintain clinical measurements within normal limits will improve Compliance with prescribed medications will improve Ability to identify changes in lifestyle to reduce recurrence of condition will improve Ability to verbalize feelings will improve Ability to disclose and discuss suicidal ideas Ability to demonstrate self-control will improve Ability to identify and develop effective coping behaviors will improve Ability to maintain clinical measurements within normal limits will improve Compliance with prescribed medications will improve     Medication Management: Evaluate patient's response, side effects, and tolerance of medication regimen.  Therapeutic Interventions: 1 to 1 sessions, Unit Group sessions and Medication administration.  Evaluation of Outcomes: Not Met   RN Treatment Plan for Primary Diagnosis: <principal problem not specified> Long Term Goal(s): Knowledge of  disease and therapeutic regimen to maintain health will improve  Short Term Goals: Ability to participate in decision making will improve, Ability to verbalize feelings will improve, Ability to disclose and discuss suicidal ideas, Ability to identify and develop effective coping behaviors will improve and Compliance with prescribed medications will improve  Medication Management: RN will administer medications as ordered by provider, will assess and evaluate patient's response and provide education to patient for prescribed medication. RN will report any adverse and/or side effects to prescribing provider.  Therapeutic Interventions: 1 on 1 counseling sessions, Psychoeducation, Medication administration, Evaluate responses to treatment, Monitor vital signs and CBGs as ordered, Perform/monitor CIWA, COWS, AIMS and Fall Risk screenings as ordered, Perform wound care treatments as ordered.  Evaluation of Outcomes: Not Met   LCSW Treatment Plan for Primary Diagnosis: <principal problem not specified> Long Term Goal(s): Safe transition to appropriate next level of care at discharge, Engage patient in therapeutic group addressing interpersonal concerns.  Short Term Goals: Engage patient in aftercare planning with referrals and resources  Therapeutic Interventions: Assess for all discharge needs, 1 to 1 time with Social worker, Explore available resources and support systems, Assess for adequacy in community support network, Educate family and significant other(s) on suicide prevention, Complete Psychosocial Assessment, Interpersonal group therapy.  Evaluation of Outcomes: Not Met   Progress in Treatment: Attending groups: No. New to unit  Participating in groups: No. Taking medication as prescribed: Yes. Toleration medication:  Yes. Family/Significant other contact made: No, will contact:  if patient consents to collateral contacts Patient understands diagnosis: Yes. Discussing patient  identified problems/goals with staff: Yes. Medical problems stabilized or resolved: Yes. Denies suicidal/homicidal ideation: Yes. Issues/concerns per patient self-inventory: No. Other:   New problem(s) identified: None   New Short Term/Long Term Goal(s): Detox, medication stabilization, elimination of SI thoughts, development of comprehensive mental wellness plan.    Patient Goals: "I need to completely reset due to my lack of sleep, poor diety and stress. I just had a big meltdown"   Discharge Plan or Barriers: Patient recently admitted. CSW will continue to follow and assess for appropriate referrals and possible discharge planning.    Reason for Continuation of Hospitalization: Anxiety Depression Medication stabilization Suicidal ideation  Estimated Length of Stay: 3-5 days   Attendees: Patient: Tiffany Wong  02/06/2020 10:58 AM  Physician: Dr. Myles Lipps, MD 02/06/2020 10:58 AM  Nursing:  02/06/2020 10:58 AM  RN Care Manager: 02/06/2020 10:58 AM  Social Worker: Radonna Ricker, LCSW 02/06/2020 10:58 AM  Recreational Therapist:  02/06/2020 10:58 AM  Other:  02/06/2020 10:58 AM  Other:  02/06/2020 10:58 AM  Other: 02/06/2020 10:58 AM    Scribe for Treatment Team: Marylee Floras, [place] 02/06/2020 10:58 AM

## 2020-02-06 NOTE — Progress Notes (Signed)
   02/06/20 2220  Psych Admission Type (Psych Patients Only)  Admission Status Voluntary  Psychosocial Assessment  Patient Complaints Anxiety  Eye Contact Fair  Facial Expression Anxious  Affect Depressed  Speech Logical/coherent  Interaction Assertive  Motor Activity Other (Comment) (WNL)  Appearance/Hygiene Unremarkable  Behavior Characteristics Cooperative  Mood Pleasant  Thought Process  Coherency WDL  Content WDL  Delusions None reported or observed  Perception WDL  Hallucination None reported or observed  Judgment Poor  Confusion None  Danger to Self  Current suicidal ideation? Denies  Danger to Others  Danger to Others None reported or observed   Pt states that she has a good day. She stated that "we went outside and got fresh air and played basketball. It was like everyone's walls came down. There were some tears, but that's why we are here. It was great. A day like this, you can't beat it." Pt rated anxiety 4/10. States that she feels listened to here and that the provider takes what she says seriously.

## 2020-02-06 NOTE — Progress Notes (Signed)
D:  Patient's self inventory sheet, patient has fair sleep, no sleep medication.  Fair appetite, low energy level, good concentration.  Rated depression 4, hopeless and anxiety #6.  Denied withdrawals.  Denied SI.  Physical problems, sore neck and mid/upper back.  Physical pain, neck and back, worst pain in past 24 hours is #6.  Pain medication is helpful.  Goal is let go of thought patterns and emotions that are harmful and do not serve any purpose.  Attend all groups.  No discharge plans at this time. A:  Medications administered per MD orders.  Emotional support and encouragement given patient. R:  Denied SI and HI, contracts for safety.  Denied A/V hallucinations.  Safety maintained with 15 minute checks.
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
## 2020-02-06 NOTE — Progress Notes (Signed)
[facility] Group Notes:  (Nursing/MHT/Case Management/Adjunct)  Date:  02/06/2020  Time:  2030  Type of Therapy:  wrap up group  Participation Level:  Active  Participation Quality:  Attentive, Monopolizing, Redirectable, Sharing and Supportive  Affect:  Angry and Excited  Cognitive:  Alert  Insight:  Lacking  Engagement in Group:  Engaged  Modes of Intervention:  Clarification, Education and Support  Summary of Progress/Problems: Positive thinking and positive change were discussed.   Johann Capers S 02/06/2020, 9:22 PM

## 2020-02-07 MED ORDER — LORAZEPAM 1 MG PO TABS
1.0000 mg | ORAL_TABLET | Freq: Once | ORAL | Status: AC
  Administered 2020-02-07: 16:00:00 1 mg via ORAL

## 2020-02-07 MED ORDER — GABAPENTIN 100 MG PO CAPS
200.0000 mg | ORAL_CAPSULE | Freq: Two times a day (BID) | ORAL | Status: DC
  Administered 2020-02-07 – 2020-02-08 (×2): 200 mg via ORAL
  Filled 2020-02-07 (×4): qty 2

## 2020-02-07 MED ORDER — LORAZEPAM 1 MG PO TABS
ORAL_TABLET | ORAL | Status: AC
  Filled 2020-02-07: qty 1

## 2020-02-07 MED ORDER — GABAPENTIN 400 MG PO CAPS
400.0000 mg | ORAL_CAPSULE | Freq: Every day | ORAL | Status: DC
  Administered 2020-02-07: 22:00:00 400 mg via ORAL
  Filled 2020-02-07 (×2): qty 1

## 2020-02-07 NOTE — Progress Notes (Signed)
   02/06/20 2220  Psych Admission Type (Psych Patients Only)  Admission Status Voluntary  Psychosocial Assessment  Patient Complaints Anxiety  Eye Contact Fair  Facial Expression Anxious  Affect Depressed  Speech Logical/coherent  Interaction Assertive  Motor Activity Other (Comment) (WNL)  Appearance/Hygiene Unremarkable  Behavior Characteristics Cooperative  Mood Pleasant  Thought Process  Coherency WDL  Content WDL  Delusions None reported or observed  Perception WDL  Hallucination None reported or observed  Judgment Poor  Confusion None  Danger to Self  Current suicidal ideation? Denies  Danger to Others  Danger to Others None reported or observed
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
## 2020-02-07 NOTE — Progress Notes (Signed)
Pt c/o thoughts racing through her mind and not being able to sleep. Pt states that when it gets quiet she does her best thinking but she has a lot to think about as far as planning for when she is discharged. Pt given Ativan 0.5 mg.

## 2020-02-07 NOTE — [facility] Suicide Risk Assessment (Cosign Needed)
[facility] INPATIENT:  Family/Significant Other Suicide Prevention Education  Suicide Prevention Education:  Patient Refusal for Family/Significant Other Suicide Prevention Education: The patient Tiffany Wong has refused to provide written consent for family/significant other to be provided Family/Significant Other Suicide Prevention Education during admission and/or prior to discharge.  Physician notified.  Reynold Bowen 02/07/2020, 10:14 AM
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
## 2020-02-08 MED ORDER — PANTOPRAZOLE SODIUM 40 MG PO TBEC: 40.0000 mg | DELAYED_RELEASE_TABLET | Freq: Every day | ORAL | 0 refills | Status: DC

## 2020-02-08 MED ORDER — GABAPENTIN 100 MG PO CAPS: 200.0000 mg | ORAL_CAPSULE | Freq: Two times a day (BID) | ORAL | 1 refills | Status: DC

## 2020-02-08 MED ORDER — GABAPENTIN 400 MG PO CAPS: 400.0000 mg | ORAL_CAPSULE | Freq: Every day | ORAL | 0 refills | Status: DC

## 2020-02-08 MED ORDER — HYDROXYZINE HCL 25 MG PO TABS: ORAL_TABLET | ORAL | 0 refills | Status: DC

## 2020-02-08 MED ORDER — HYDROXYZINE HCL 50 MG PO TABS: 50.0000 mg | ORAL_TABLET | Freq: Every evening | ORAL | 0 refills | Status: DC | PRN

## 2020-02-08 MED ORDER — BUSPIRONE HCL 10 MG PO TABS: 10.0000 mg | ORAL_TABLET | Freq: Three times a day (TID) | ORAL | 1 refills | Status: DC

## 2020-02-08 MED ORDER — NICOTINE 21 MG/24HR TD PT24: 21.0000 mg | MEDICATED_PATCH | Freq: Every day | TRANSDERMAL | 0 refills | Status: DC

## 2020-02-08 MED ORDER — LAMOTRIGINE 25 MG PO TABS: 25.0000 mg | ORAL_TABLET | Freq: Every day | ORAL | 0 refills | Status: DC
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
## 2020-02-08 NOTE — Progress Notes (Signed)
Pt discharged to lobby. Pt was stable and appreciative at that time. All papers and prescriptions were given and valuables returned. Verbal understanding expressed. Denies SI/HI and A/VH. Pt given opportunity to express concerns and ask questions.  

## 2020-02-08 NOTE — BHH Suicide Risk Assessment (Signed)
[facility] Discharge Suicide Risk Assessment   Principal Problem: <principal problem not specified> Discharge Diagnoses: Active Problems:   Bipolar 1 disorder (HCC)   Total Time spent with patient: 20 minutes  Musculoskeletal: Strength & Muscle Tone: within normal limits Gait & Station: normal Patient leans: N/A  Psychiatric Specialty Exam: Review of Systems  All other systems reviewed and are negative.   Blood pressure 104/76, pulse 95, temperature 97.9 F (36.6 C), temperature source Oral, resp. rate 16, height 5\' 1"  (1.549 m), weight 43.1 kg, SpO2 100 %, unknown if currently breastfeeding.Body mass index is 17.95 kg/m.  General Appearance: Casual  Eye Contact::  Good  Speech:  Normal Rate409  Volume:  Normal  Mood:  Euthymic  Affect:  Congruent  Thought Process:  Coherent and Descriptions of Associations: Intact  Orientation:  Full (Time, Place, and Person)  Thought Content:  Logical  Suicidal Thoughts:  No  Homicidal Thoughts:  No  Memory:  Immediate;   Good Recent;   Good Remote;   Good  Judgement:  Intact  Insight:  Fair  Psychomotor Activity:  Normal  Concentration:  Good  Recall:  Good  Fund of Knowledge:Fair  Language: Good  Akathisia:  Negative  Handed:  Right  AIMS (if indicated):     Assets:  Communication Skills Desire for Improvement Physical Health Resilience Talents/Skills Vocational/Educational  Sleep:  Number of Hours: 4  Cognition: WNL  ADL's:  Intact   Mental Status Per Nursing Assessment::   On Admission:  Self-harm thoughts  Demographic Factors:  Low socioeconomic status  Loss Factors: Loss of significant relationship and Financial problems/change in socioeconomic status  Historical Factors: Impulsivity  Risk Reduction Factors:   Responsible for children under 57 years of age, Sense of responsibility to family, Employed and Positive social support  Continued Clinical Symptoms:  Bipolar Disorder:   Mixed State  Cognitive Features  That Contribute To Risk:  None    Suicide Risk:  Minimal: No identifiable suicidal ideation.  Patients presenting with no risk factors but with morbid ruminations; may be classified as minimal risk based on the severity of the depressive symptoms    Plan Of Care/Follow-up recommendations:  Activity:  ad lib  15, MD 02/08/2020, 7:51 AM
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
## 2020-02-08 NOTE — Progress Notes (Signed)
  [facility] Adult Case Management Discharge Plan :  Will you be returning to the same living situation after discharge:  Yes,  To hotel  At discharge, do you have transportation home?: Yes,  Pt will ride with another discharge pt Do you have the ability to pay for your medications: Yes,  medicaid  Release of information consent forms completed and in the chart;  Patient's signature needed at discharge.  Patient to Follow up at: Follow-up Information    [facility] Follow up on 02/12/2020.   Why: You are scheduled for an appointment on 02/12/20 at 11:00.  This will be a virtual tele-health appointment.  Please have your discharge summary available. Contact information: [address] [place] [address] [phone]           Next level of care provider has access to [facility] Link:no  Safety Planning and Suicide Prevention discussed: Yes,  with pt; pt declined      Has patient been referred to the Quitline?: Patient refused referral  Patient has been referred for addiction treatment: Yes  Reynold Bowen, Student-Social Work 02/08/2020, 9:44 AM

## 2020-03-16 ENCOUNTER — Emergency Department (HOSPITAL_COMMUNITY)
Admission: EM | Admit: 2020-03-16 | Discharge: 2020-03-16 | Disposition: A | Discharge status: Home / Self Care | Payer: Medicaid Other | Attending: Emergency Medicine | Admitting: Emergency Medicine

## 2020-03-16 ENCOUNTER — Encounter (HOSPITAL_COMMUNITY): Payer: Self-pay

## 2020-03-16 ENCOUNTER — Other Ambulatory Visit: Payer: Self-pay

## 2020-03-16 DIAGNOSIS — Z79899 Other long term (current) drug therapy: Secondary | ICD-10-CM | POA: Diagnosis not present

## 2020-03-16 DIAGNOSIS — F17210 Nicotine dependence, cigarettes, uncomplicated: Secondary | ICD-10-CM | POA: Insufficient documentation

## 2020-03-16 DIAGNOSIS — Z733 Stress, not elsewhere classified: Secondary | ICD-10-CM | POA: Diagnosis not present

## 2020-03-16 DIAGNOSIS — R195 Other fecal abnormalities: Secondary | ICD-10-CM | POA: Diagnosis not present

## 2020-03-16 DIAGNOSIS — R109 Unspecified abdominal pain: Secondary | ICD-10-CM | POA: Diagnosis not present

## 2020-03-16 DIAGNOSIS — R630 Anorexia: Secondary | ICD-10-CM | POA: Insufficient documentation

## 2020-03-16 DIAGNOSIS — R110 Nausea: Secondary | ICD-10-CM | POA: Diagnosis not present

## 2020-03-16 DIAGNOSIS — Z20822 Contact with and (suspected) exposure to covid-19: Secondary | ICD-10-CM | POA: Diagnosis not present

## 2020-03-16 LAB — COMPREHENSIVE METABOLIC PANEL
ALT: 13 U/L (ref 0–44)
AST: 15 U/L (ref 15–41)
Albumin: 4.5 g/dL (ref 3.5–5.0)
Alkaline Phosphatase: 70 U/L (ref 38–126)
Anion gap: 8 (ref 5–15)
BUN: 8 mg/dL (ref 6–20)
CO2: 25 mmol/L (ref 22–32)
Calcium: 8.8 mg/dL — ABNORMAL LOW (ref 8.9–10.3)
Chloride: 105 mmol/L (ref 98–111)
Creatinine, Ser: 0.53 mg/dL (ref 0.44–1.00)
GFR calc Af Amer: >60 mL/min (ref >60)
GFR calc non Af Amer: >60 mL/min (ref >60)
Glucose, Bld: 96 mg/dL (ref 70–99)
Potassium: 3.4 mmol/L — ABNORMAL LOW (ref 3.5–5.1)
Sodium: 138 mmol/L (ref 135–145)
Total Bilirubin: 0.6 mg/dL (ref 0.3–1.2)
Total Protein: 7.7 g/dL (ref 6.5–8.1)

## 2020-03-16 LAB — URINALYSIS, ROUTINE W REFLEX MICROSCOPIC
Bilirubin Urine: NEGATIVE
Glucose, UA: NEGATIVE mg/dL
Hgb urine dipstick: NEGATIVE
Ketones, ur: NEGATIVE mg/dL
Leukocytes,Ua: NEGATIVE
Nitrite: NEGATIVE
Protein, ur: NEGATIVE mg/dL
Specific Gravity, Urine: 1.02 (ref 1.005–1.030)
pH: 6 (ref 5.0–8.0)

## 2020-03-16 LAB — CBC
HCT: 38.4 % (ref 36.0–46.0)
Hemoglobin: 12.7 g/dL (ref 12.0–15.0)
MCH: 29.4 pg (ref 26.0–34.0)
MCHC: 33.1 g/dL (ref 30.0–36.0)
MCV: 88.9 fL (ref 80.0–100.0)
Platelets: 276 10*3/uL (ref 150–400)
RBC: 4.32 MIL/uL (ref 3.87–5.11)
RDW: 12.8 % (ref 11.5–15.5)
WBC: 7.3 10*3/uL (ref 4.0–10.5)
nRBC: 0 % (ref 0.0–0.2)

## 2020-03-16 LAB — I-STAT BETA HCG BLOOD, ED (MC, WL, AP ONLY): I-stat hCG, quantitative: <5 m[IU]/mL (ref <5)

## 2020-03-16 LAB — RAPID HIV SCREEN (HIV 1/2 AB+AG)
HIV 1/2 Antibodies: NONREACTIVE
HIV-1 P24 Antigen - HIV24: NONREACTIVE

## 2020-03-16 LAB — LIPASE, BLOOD: Lipase: 31 U/L (ref 11–51)
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
## 2020-03-16 NOTE — ED Notes (Signed)
Pt attempting to provide urine sample at this time

## 2020-03-16 NOTE — ED Triage Notes (Addendum)
Pt sts diarrhea that was black since Monday and vomiting every time she wakes up. Pt sts once diarrhea color went normal it was mucous and looked like a worm. Wants to rule worms out.  Stool is formed now. No appetite.

## 2020-03-16 NOTE — Discharge Instructions (Addendum)
All the results in the ER are normal, labs and imaging. We are not sure what is causing your symptoms. The workup in the ER is not complete, and is limited to screening for life threatening and emergent conditions only, so please see a primary care doctor or GI doctor for further evaluation.

## 2020-03-17 LAB — SARS CORONAVIRUS 2 (TAT 6-24 HRS): SARS Coronavirus 2: NEGATIVE

## 2020-05-22 ENCOUNTER — Encounter (HOSPITAL_COMMUNITY): Payer: Self-pay | Admitting: Emergency Medicine

## 2020-05-22 ENCOUNTER — Other Ambulatory Visit: Payer: Self-pay

## 2020-05-22 ENCOUNTER — Emergency Department (HOSPITAL_COMMUNITY)
Admission: EM | Admit: 2020-05-22 | Discharge: 2020-05-22 | Disposition: A | Discharge status: Home / Self Care | Payer: Medicaid Other | Attending: Emergency Medicine | Admitting: Emergency Medicine

## 2020-05-22 DIAGNOSIS — N939 Abnormal uterine and vaginal bleeding, unspecified: Secondary | ICD-10-CM | POA: Insufficient documentation

## 2020-05-22 DIAGNOSIS — Z5321 Procedure and treatment not carried out due to patient leaving prior to being seen by health care provider: Secondary | ICD-10-CM | POA: Diagnosis not present

## 2020-05-22 LAB — BASIC METABOLIC PANEL
Anion gap: 8 (ref 5–15)
BUN: 8 mg/dL (ref 6–20)
CO2: 25 mmol/L (ref 22–32)
Calcium: 8.8 mg/dL — ABNORMAL LOW (ref 8.9–10.3)
Chloride: 106 mmol/L (ref 98–111)
Creatinine, Ser: 0.53 mg/dL (ref 0.44–1.00)
GFR calc Af Amer: >60 mL/min (ref >60)
GFR calc non Af Amer: >60 mL/min (ref >60)
Glucose, Bld: 75 mg/dL (ref 70–99)
Potassium: 3.4 mmol/L — ABNORMAL LOW (ref 3.5–5.1)
Sodium: 139 mmol/L (ref 135–145)

## 2020-05-22 LAB — CBC
HCT: 37.9 % (ref 36.0–46.0)
Hemoglobin: 12.4 g/dL (ref 12.0–15.0)
MCH: 29.5 pg (ref 26.0–34.0)
MCHC: 32.7 g/dL (ref 30.0–36.0)
MCV: 90 fL (ref 80.0–100.0)
Platelets: 242 10*3/uL (ref 150–400)
RBC: 4.21 MIL/uL (ref 3.87–5.11)
RDW: 12.6 % (ref 11.5–15.5)
WBC: 6.4 10*3/uL (ref 4.0–10.5)
nRBC: 0 % (ref 0.0–0.2)

## 2020-05-22 LAB — I-STAT BETA HCG BLOOD, ED (MC, WL, AP ONLY): I-stat hCG, quantitative: <5 m[IU]/mL (ref <5)

## 2020-05-22 NOTE — ED Notes (Signed)
Per registration, patient turned in labels and reports she is leaving. 

## 2020-05-22 NOTE — ED Triage Notes (Signed)
Patient c/o vaginal bleeding since June. Denies pain.

## 2020-06-01 ENCOUNTER — Inpatient Hospital Stay (HOSPITAL_COMMUNITY)
Admission: EM | Admit: 2020-06-01 | Discharge: 2020-06-10 | DRG: 918 | Disposition: A | Discharge status: Other Acute Inpatient Hospital | Payer: Medicaid Other | Attending: Internal Medicine | Admitting: Internal Medicine

## 2020-06-01 ENCOUNTER — Other Ambulatory Visit: Payer: Self-pay

## 2020-06-01 DIAGNOSIS — R7989 Other specified abnormal findings of blood chemistry: Secondary | ICD-10-CM

## 2020-06-01 DIAGNOSIS — T1491XA Suicide attempt, initial encounter: Secondary | ICD-10-CM

## 2020-06-01 DIAGNOSIS — Z91410 Personal history of adult physical and sexual abuse: Secondary | ICD-10-CM

## 2020-06-01 DIAGNOSIS — E872 Acidosis: Secondary | ICD-10-CM | POA: Diagnosis present

## 2020-06-01 DIAGNOSIS — R451 Restlessness and agitation: Secondary | ICD-10-CM | POA: Diagnosis present

## 2020-06-01 DIAGNOSIS — F17210 Nicotine dependence, cigarettes, uncomplicated: Secondary | ICD-10-CM | POA: Diagnosis present

## 2020-06-01 DIAGNOSIS — R195 Other fecal abnormalities: Secondary | ICD-10-CM

## 2020-06-01 DIAGNOSIS — R456 Violent behavior: Secondary | ICD-10-CM

## 2020-06-01 DIAGNOSIS — R748 Abnormal levels of other serum enzymes: Secondary | ICD-10-CM | POA: Diagnosis present

## 2020-06-01 DIAGNOSIS — R4589 Other symptoms and signs involving emotional state: Secondary | ICD-10-CM | POA: Diagnosis present

## 2020-06-01 DIAGNOSIS — F1210 Cannabis abuse, uncomplicated: Secondary | ICD-10-CM | POA: Diagnosis present

## 2020-06-01 DIAGNOSIS — N857 Hematometra: Secondary | ICD-10-CM | POA: Diagnosis present

## 2020-06-01 DIAGNOSIS — Z832 Family history of diseases of the blood and blood-forming organs and certain disorders involving the immune mechanism: Secondary | ICD-10-CM

## 2020-06-01 DIAGNOSIS — Z813 Family history of other psychoactive substance abuse and dependence: Secondary | ICD-10-CM

## 2020-06-01 DIAGNOSIS — F1911 Other psychoactive substance abuse, in remission: Secondary | ICD-10-CM | POA: Diagnosis present

## 2020-06-01 DIAGNOSIS — D649 Anemia, unspecified: Secondary | ICD-10-CM | POA: Diagnosis present

## 2020-06-01 DIAGNOSIS — F319 Bipolar disorder, unspecified: Secondary | ICD-10-CM | POA: Diagnosis present

## 2020-06-01 DIAGNOSIS — D684 Acquired coagulation factor deficiency: Secondary | ICD-10-CM | POA: Diagnosis present

## 2020-06-01 DIAGNOSIS — Z8249 Family history of ischemic heart disease and other diseases of the circulatory system: Secondary | ICD-10-CM

## 2020-06-01 DIAGNOSIS — F323 Major depressive disorder, single episode, severe with psychotic features: Secondary | ICD-10-CM

## 2020-06-01 DIAGNOSIS — A6009 Herpesviral infection of other urogenital tract: Secondary | ICD-10-CM | POA: Diagnosis present

## 2020-06-01 DIAGNOSIS — Z886 Allergy status to analgesic agent status: Secondary | ICD-10-CM

## 2020-06-01 DIAGNOSIS — I959 Hypotension, unspecified: Secondary | ICD-10-CM | POA: Diagnosis present

## 2020-06-01 DIAGNOSIS — Z9119 Patient's noncompliance with other medical treatment and regimen: Secondary | ICD-10-CM

## 2020-06-01 DIAGNOSIS — K5900 Constipation, unspecified: Secondary | ICD-10-CM | POA: Diagnosis present

## 2020-06-01 DIAGNOSIS — Z915 Personal history of self-harm: Secondary | ICD-10-CM

## 2020-06-01 DIAGNOSIS — E876 Hypokalemia: Secondary | ICD-10-CM | POA: Diagnosis present

## 2020-06-01 DIAGNOSIS — F3130 Bipolar disorder, current episode depressed, mild or moderate severity, unspecified: Secondary | ICD-10-CM | POA: Diagnosis present

## 2020-06-01 DIAGNOSIS — T391X2A Poisoning by 4-Aminophenol derivatives, intentional self-harm, initial encounter: Secondary | ICD-10-CM | POA: Diagnosis present

## 2020-06-01 DIAGNOSIS — D689 Coagulation defect, unspecified: Secondary | ICD-10-CM

## 2020-06-01 DIAGNOSIS — Z20822 Contact with and (suspected) exposure to covid-19: Secondary | ICD-10-CM | POA: Diagnosis present

## 2020-06-01 DIAGNOSIS — Z818 Family history of other mental and behavioral disorders: Secondary | ICD-10-CM

## 2020-06-01 DIAGNOSIS — F1910 Other psychoactive substance abuse, uncomplicated: Secondary | ICD-10-CM | POA: Diagnosis present

## 2020-06-01 DIAGNOSIS — F1410 Cocaine abuse, uncomplicated: Secondary | ICD-10-CM | POA: Diagnosis present

## 2020-06-01 LAB — URINALYSIS, ROUTINE W REFLEX MICROSCOPIC
Bacteria, UA: NONE SEEN
Bilirubin Urine: NEGATIVE
Glucose, UA: NEGATIVE mg/dL
Ketones, ur: 20 mg/dL — AB
Leukocytes,Ua: NEGATIVE
Nitrite: NEGATIVE
Protein, ur: NEGATIVE mg/dL
Specific Gravity, Urine: 1.026 (ref 1.005–1.030)
pH: 5 (ref 5.0–8.0)

## 2020-06-01 LAB — CBC WITH DIFFERENTIAL/PLATELET
Abs Immature Granulocytes: 0.02 10*3/uL (ref 0.00–0.07)
Basophils Absolute: 0 10*3/uL (ref 0.0–0.1)
Basophils Relative: 0 %
Eosinophils Absolute: 0 10*3/uL (ref 0.0–0.5)
Eosinophils Relative: 0 %
HCT: 34.3 % — ABNORMAL LOW (ref 36.0–46.0)
Hemoglobin: 11.5 g/dL — ABNORMAL LOW (ref 12.0–15.0)
Immature Granulocytes: 0 %
Lymphocytes Relative: 15 %
Lymphs Abs: 1.2 10*3/uL (ref 0.7–4.0)
MCH: 29.6 pg (ref 26.0–34.0)
MCHC: 33.5 g/dL (ref 30.0–36.0)
MCV: 88.2 fL (ref 80.0–100.0)
Monocytes Absolute: 0.7 10*3/uL (ref 0.1–1.0)
Monocytes Relative: 9 %
Neutro Abs: 5.8 10*3/uL (ref 1.7–7.7)
Neutrophils Relative %: 76 %
Platelets: 220 10*3/uL (ref 150–400)
RBC: 3.89 MIL/uL (ref 3.87–5.11)
RDW: 12.3 % (ref 11.5–15.5)
WBC: 7.7 10*3/uL (ref 4.0–10.5)
nRBC: 0 % (ref 0.0–0.2)

## 2020-06-01 LAB — PREGNANCY, URINE: Preg Test, Ur: NEGATIVE

## 2020-06-01 MED ORDER — ONDANSETRON HCL 4 MG/2ML IJ SOLN
INTRAMUSCULAR | Status: AC
  Administered 2020-06-01: 4 mg
  Filled 2020-06-01: qty 2

## 2020-06-01 MED ORDER — SODIUM CHLORIDE 0.9 % IV SOLN: Freq: Once | INTRAVENOUS | Status: AC

## 2020-06-01 NOTE — ED Notes (Signed)
Patient has been placed in a hospital gown, on the continuous cardiac monitor with automatic BP and pulse ox.  Hob elevated, emesis bag provided.

## 2020-06-01 NOTE — ED Triage Notes (Signed)
Patient brought in by [facility] for intentional dosage of about 70 grams of tylenol.  BP 88/54, 80's NSR, 18g to the lleft AC, 1 L of NS.

## 2020-06-01 NOTE — ED Notes (Addendum)
CBC, CMP, ETOH, Salicylate. 4 hour post ingestion tylenol level (0130), Charcoal without sorbitol if she can tolerate. EKG. Acetylcysteine if acetaminophen level is >150 at 4 hrs post.

## 2020-06-01 NOTE — ED Notes (Signed)
ED Provider at bedside. 
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
## 2020-06-02 ENCOUNTER — Encounter (HOSPITAL_COMMUNITY): Payer: Self-pay | Admitting: Family Medicine

## 2020-06-02 DIAGNOSIS — T391X2A Poisoning by 4-Aminophenol derivatives, intentional self-harm, initial encounter: Secondary | ICD-10-CM | POA: Diagnosis present

## 2020-06-02 DIAGNOSIS — Z9119 Patient's noncompliance with other medical treatment and regimen: Secondary | ICD-10-CM | POA: Diagnosis not present

## 2020-06-02 DIAGNOSIS — Z813 Family history of other psychoactive substance abuse and dependence: Secondary | ICD-10-CM | POA: Diagnosis not present

## 2020-06-02 DIAGNOSIS — F17210 Nicotine dependence, cigarettes, uncomplicated: Secondary | ICD-10-CM | POA: Diagnosis present

## 2020-06-02 DIAGNOSIS — D689 Coagulation defect, unspecified: Secondary | ICD-10-CM | POA: Diagnosis not present

## 2020-06-02 DIAGNOSIS — Z915 Personal history of self-harm: Secondary | ICD-10-CM | POA: Diagnosis not present

## 2020-06-02 DIAGNOSIS — R748 Abnormal levels of other serum enzymes: Secondary | ICD-10-CM | POA: Diagnosis present

## 2020-06-02 DIAGNOSIS — N857 Hematometra: Secondary | ICD-10-CM | POA: Diagnosis present

## 2020-06-02 DIAGNOSIS — F1210 Cannabis abuse, uncomplicated: Secondary | ICD-10-CM | POA: Diagnosis present

## 2020-06-02 DIAGNOSIS — F1910 Other psychoactive substance abuse, uncomplicated: Secondary | ICD-10-CM | POA: Diagnosis not present

## 2020-06-02 DIAGNOSIS — Z20822 Contact with and (suspected) exposure to covid-19: Secondary | ICD-10-CM | POA: Diagnosis present

## 2020-06-02 DIAGNOSIS — Z91410 Personal history of adult physical and sexual abuse: Secondary | ICD-10-CM | POA: Diagnosis not present

## 2020-06-02 DIAGNOSIS — F1911 Other psychoactive substance abuse, in remission: Secondary | ICD-10-CM | POA: Diagnosis present

## 2020-06-02 DIAGNOSIS — F3130 Bipolar disorder, current episode depressed, mild or moderate severity, unspecified: Secondary | ICD-10-CM | POA: Diagnosis present

## 2020-06-02 DIAGNOSIS — R451 Restlessness and agitation: Secondary | ICD-10-CM | POA: Diagnosis present

## 2020-06-02 DIAGNOSIS — Z832 Family history of diseases of the blood and blood-forming organs and certain disorders involving the immune mechanism: Secondary | ICD-10-CM | POA: Diagnosis not present

## 2020-06-02 DIAGNOSIS — T391X2D Poisoning by 4-Aminophenol derivatives, intentional self-harm, subsequent encounter: Secondary | ICD-10-CM | POA: Diagnosis not present

## 2020-06-02 DIAGNOSIS — E872 Acidosis: Secondary | ICD-10-CM | POA: Diagnosis present

## 2020-06-02 DIAGNOSIS — R195 Other fecal abnormalities: Secondary | ICD-10-CM | POA: Diagnosis present

## 2020-06-02 DIAGNOSIS — Z818 Family history of other mental and behavioral disorders: Secondary | ICD-10-CM | POA: Diagnosis not present

## 2020-06-02 DIAGNOSIS — I959 Hypotension, unspecified: Secondary | ICD-10-CM | POA: Diagnosis present

## 2020-06-02 DIAGNOSIS — D649 Anemia, unspecified: Secondary | ICD-10-CM | POA: Diagnosis present

## 2020-06-02 DIAGNOSIS — K5900 Constipation, unspecified: Secondary | ICD-10-CM | POA: Diagnosis present

## 2020-06-02 DIAGNOSIS — D684 Acquired coagulation factor deficiency: Secondary | ICD-10-CM | POA: Diagnosis present

## 2020-06-02 DIAGNOSIS — A6009 Herpesviral infection of other urogenital tract: Secondary | ICD-10-CM | POA: Diagnosis present

## 2020-06-02 DIAGNOSIS — F1410 Cocaine abuse, uncomplicated: Secondary | ICD-10-CM | POA: Diagnosis present

## 2020-06-02 DIAGNOSIS — E876 Hypokalemia: Secondary | ICD-10-CM | POA: Diagnosis present

## 2020-06-02 DIAGNOSIS — Z8249 Family history of ischemic heart disease and other diseases of the circulatory system: Secondary | ICD-10-CM | POA: Diagnosis not present

## 2020-06-02 DIAGNOSIS — R7989 Other specified abnormal findings of blood chemistry: Secondary | ICD-10-CM | POA: Diagnosis not present

## 2020-06-02 DIAGNOSIS — Z886 Allergy status to analgesic agent status: Secondary | ICD-10-CM | POA: Diagnosis not present

## 2020-06-02 DIAGNOSIS — T1491XA Suicide attempt, initial encounter: Secondary | ICD-10-CM | POA: Diagnosis not present

## 2020-06-02 DIAGNOSIS — F3113 Bipolar disorder, current episode manic without psychotic features, severe: Secondary | ICD-10-CM | POA: Diagnosis not present

## 2020-06-02 HISTORY — DX: Restlessness and agitation: R45.1

## 2020-06-02 HISTORY — DX: Poisoning by 4-aminophenol derivatives, intentional self-harm, initial encounter: T39.1X2A

## 2020-06-02 LAB — COMPREHENSIVE METABOLIC PANEL
ALT: 122 U/L — ABNORMAL HIGH (ref 0–44)
ALT: 17 U/L (ref 0–44)
ALT: 65 U/L — ABNORMAL HIGH (ref 0–44)
ALT: 98 U/L — ABNORMAL HIGH (ref 0–44)
AST: 101 U/L — ABNORMAL HIGH (ref 15–41)
AST: 28 U/L (ref 15–41)
AST: 66 U/L — ABNORMAL HIGH (ref 15–41)
AST: 68 U/L — ABNORMAL HIGH (ref 15–41)
Albumin: 3.7 g/dL (ref 3.5–5.0)
Albumin: 4 g/dL (ref 3.5–5.0)
Albumin: 4.3 g/dL (ref 3.5–5.0)
Albumin: 4.5 g/dL (ref 3.5–5.0)
Alkaline Phosphatase: 64 U/L (ref 38–126)
Alkaline Phosphatase: 64 U/L (ref 38–126)
Alkaline Phosphatase: 72 U/L (ref 38–126)
Alkaline Phosphatase: 75 U/L (ref 38–126)
Anion gap: 10 (ref 5–15)
Anion gap: 16 — ABNORMAL HIGH (ref 5–15)
Anion gap: 17 — ABNORMAL HIGH (ref 5–15)
Anion gap: 18 — ABNORMAL HIGH (ref 5–15)
BUN: 10 mg/dL (ref 6–20)
BUN: 12 mg/dL (ref 6–20)
BUN: 8 mg/dL (ref 6–20)
BUN: 9 mg/dL (ref 6–20)
CO2: 10 mmol/L — ABNORMAL LOW (ref 22–32)
CO2: 12 mmol/L — ABNORMAL LOW (ref 22–32)
CO2: 14 mmol/L — ABNORMAL LOW (ref 22–32)
CO2: 17 mmol/L — ABNORMAL LOW (ref 22–32)
Calcium: 7.9 mg/dL — ABNORMAL LOW (ref 8.9–10.3)
Calcium: 8 mg/dL — ABNORMAL LOW (ref 8.9–10.3)
Calcium: 8.1 mg/dL — ABNORMAL LOW (ref 8.9–10.3)
Calcium: 8.4 mg/dL — ABNORMAL LOW (ref 8.9–10.3)
Chloride: 109 mmol/L (ref 98–111)
Chloride: 110 mmol/L (ref 98–111)
Chloride: 111 mmol/L (ref 98–111)
Chloride: 111 mmol/L (ref 98–111)
Creatinine, Ser: 0.62 mg/dL (ref 0.44–1.00)
Creatinine, Ser: 0.62 mg/dL (ref 0.44–1.00)
Creatinine, Ser: 0.77 mg/dL (ref 0.44–1.00)
Creatinine, Ser: 0.83 mg/dL (ref 0.44–1.00)
GFR calc Af Amer: >60 mL/min (ref >60)
GFR calc Af Amer: >60 mL/min (ref >60)
GFR calc Af Amer: >60 mL/min (ref >60)
GFR calc Af Amer: >60 mL/min (ref >60)
GFR calc non Af Amer: >60 mL/min (ref >60)
GFR calc non Af Amer: >60 mL/min (ref >60)
GFR calc non Af Amer: >60 mL/min (ref >60)
GFR calc non Af Amer: >60 mL/min (ref >60)
Glucose, Bld: 108 mg/dL — ABNORMAL HIGH (ref 70–99)
Glucose, Bld: 132 mg/dL — ABNORMAL HIGH (ref 70–99)
Glucose, Bld: 135 mg/dL — ABNORMAL HIGH (ref 70–99)
Glucose, Bld: 88 mg/dL (ref 70–99)
Potassium: 3 mmol/L — ABNORMAL LOW (ref 3.5–5.1)
Potassium: 3.3 mmol/L — ABNORMAL LOW (ref 3.5–5.1)
Potassium: 3.8 mmol/L (ref 3.5–5.1)
Potassium: 4.5 mmol/L (ref 3.5–5.1)
Sodium: 137 mmol/L (ref 135–145)
Sodium: 139 mmol/L (ref 135–145)
Sodium: 139 mmol/L (ref 135–145)
Sodium: 140 mmol/L (ref 135–145)
Total Bilirubin: 0.7 mg/dL (ref 0.3–1.2)
Total Bilirubin: 1 mg/dL (ref 0.3–1.2)
Total Bilirubin: 1.2 mg/dL (ref 0.3–1.2)
Total Bilirubin: 1.9 mg/dL — ABNORMAL HIGH (ref 0.3–1.2)
Total Protein: 6.5 g/dL (ref 6.5–8.1)
Total Protein: 6.7 g/dL (ref 6.5–8.1)
Total Protein: 7.1 g/dL (ref 6.5–8.1)
Total Protein: 7.9 g/dL (ref 6.5–8.1)

## 2020-06-02 LAB — CBC
HCT: 35.8 % — ABNORMAL LOW (ref 36.0–46.0)
Hemoglobin: 11.9 g/dL — ABNORMAL LOW (ref 12.0–15.0)
MCH: 29.5 pg (ref 26.0–34.0)
MCHC: 33.2 g/dL (ref 30.0–36.0)
MCV: 88.6 fL (ref 80.0–100.0)
Platelets: 219 10*3/uL (ref 150–400)
RBC: 4.04 MIL/uL (ref 3.87–5.11)
RDW: 12.3 % (ref 11.5–15.5)
WBC: 5.8 10*3/uL (ref 4.0–10.5)
nRBC: 0 % (ref 0.0–0.2)

## 2020-06-02 LAB — BLOOD GAS, ARTERIAL
Acid-base deficit: 12.2 mmol/L — ABNORMAL HIGH (ref 0.0–2.0)
Bicarbonate: 12.2 mmol/L — ABNORMAL LOW (ref 20.0–28.0)
Drawn by: 51425
FIO2: 21
O2 Saturation: 96.1 %
Patient temperature: 98.6
pCO2 arterial: 24.4 mmHg — ABNORMAL LOW (ref 32.0–48.0)
pH, Arterial: 7.32 — ABNORMAL LOW (ref 7.350–7.450)
pO2, Arterial: 92.2 mmHg (ref 83.0–108.0)

## 2020-06-02 LAB — SARS CORONAVIRUS 2 BY RT PCR (HOSPITAL ORDER, PERFORMED IN ~~LOC~~ HOSPITAL LAB): SARS Coronavirus 2: NEGATIVE

## 2020-06-02 LAB — LIPASE, BLOOD: Lipase: 26 U/L (ref 11–51)

## 2020-06-02 LAB — ETHANOL: Alcohol, Ethyl (B): <10 mg/dL (ref <10)

## 2020-06-02 LAB — RAPID HIV SCREEN (HIV 1/2 AB+AG)
HIV 1/2 Antibodies: NONREACTIVE
HIV-1 P24 Antigen - HIV24: NONREACTIVE

## 2020-06-02 LAB — LACTIC ACID, PLASMA: Lactic Acid, Venous: 2.5 mmol/L (ref 0.5–1.9)

## 2020-06-02 LAB — MAGNESIUM: Magnesium: 2.1 mg/dL (ref 1.7–2.4)

## 2020-06-02 LAB — RAPID URINE DRUG SCREEN, HOSP PERFORMED
Amphetamines: POSITIVE — AB
Barbiturates: NOT DETECTED
Benzodiazepines: NOT DETECTED
Cocaine: POSITIVE — AB
Opiates: NOT DETECTED
Tetrahydrocannabinol: POSITIVE — AB

## 2020-06-02 LAB — HEPATITIS PANEL, ACUTE
HCV Ab: NONREACTIVE
Hep A IgM: NONREACTIVE
Hep B C IgM: NONREACTIVE
Hepatitis B Surface Ag: NONREACTIVE

## 2020-06-02 LAB — PROTIME-INR
INR: 1.7 — ABNORMAL HIGH (ref 0.8–1.2)
Prothrombin Time: 19.2 seconds — ABNORMAL HIGH (ref 11.4–15.2)

## 2020-06-02 LAB — IRON AND TIBC
Iron: 64 ug/dL (ref 28–170)
Saturation Ratios: 19 % (ref 10.4–31.8)
TIBC: 341 ug/dL (ref 250–450)
UIBC: 277 ug/dL

## 2020-06-02 LAB — ACETAMINOPHEN LEVEL
Acetaminophen (Tylenol), Serum: 336 ug/mL (ref 10–30)
Acetaminophen (Tylenol), Serum: >500 ug/mL (ref 10–30)

## 2020-06-02 LAB — TSH: TSH: 0.42 u[IU]/mL (ref 0.350–4.500)

## 2020-06-02 LAB — SALICYLATE LEVEL: Salicylate Lvl: <7 mg/dL — ABNORMAL LOW (ref 7.0–30.0)

## 2020-06-02 MED ORDER — PROMETHAZINE HCL 25 MG/ML IJ SOLN: 12.5000 mg | Freq: Once | INTRAMUSCULAR | Status: DC

## 2020-06-02 MED ORDER — ZIPRASIDONE MESYLATE 20 MG IM SOLR
20.0000 mg | Freq: Once | INTRAMUSCULAR | Status: AC
  Administered 2020-06-02: 20 mg via INTRAMUSCULAR

## 2020-06-02 MED ORDER — ONDANSETRON HCL 4 MG/2ML IJ SOLN
4.0000 mg | Freq: Four times a day (QID) | INTRAMUSCULAR | Status: DC | PRN
  Administered 2020-06-02 – 2020-06-03 (×2): 4 mg via INTRAVENOUS
  Filled 2020-06-02 (×3): qty 2

## 2020-06-02 MED ORDER — LORAZEPAM 2 MG/ML IJ SOLN
1.0000 mg | INTRAMUSCULAR | Status: DC | PRN
  Administered 2020-06-03 – 2020-06-10 (×12): 1 mg via INTRAVENOUS
  Filled 2020-06-02 (×13): qty 1

## 2020-06-02 MED ORDER — POTASSIUM CHLORIDE 10 MEQ/100ML IV SOLN
10.0000 meq | INTRAVENOUS | Status: AC
  Administered 2020-06-02 (×3): 10 meq via INTRAVENOUS
  Filled 2020-06-02 (×3): qty 100

## 2020-06-02 MED ORDER — POTASSIUM CHLORIDE IN NACL 20-0.9 MEQ/L-% IV SOLN
INTRAVENOUS | Status: DC
  Filled 2020-06-02: qty 1000

## 2020-06-02 MED ORDER — PROMETHAZINE HCL 25 MG/ML IJ SOLN
12.5000 mg | Freq: Four times a day (QID) | INTRAMUSCULAR | Status: DC | PRN
  Administered 2020-06-03: 12.5 mg via INTRAVENOUS
  Filled 2020-06-02: qty 1

## 2020-06-02 MED ORDER — ZIPRASIDONE MESYLATE 20 MG IM SOLR
20.0000 mg | Freq: Once | INTRAMUSCULAR | Status: DC
  Filled 2020-06-02: qty 20

## 2020-06-02 MED ORDER — DEXTROSE 5 % IV SOLN
15.0000 mg/kg/h | INTRAVENOUS | Status: DC
  Administered 2020-06-02 – 2020-06-04 (×3): 15 mg/kg/h via INTRAVENOUS
  Filled 2020-06-02 (×8): qty 60

## 2020-06-02 MED ORDER — STERILE WATER FOR INJECTION IJ SOLN
INTRAMUSCULAR | Status: AC
  Filled 2020-06-02: qty 10

## 2020-06-02 MED ORDER — ACETYLCYSTEINE LOAD VIA INFUSION
150.0000 mg/kg | Freq: Once | INTRAVENOUS | Status: AC
  Administered 2020-06-02 (×2): 6810 mg via INTRAVENOUS
  Filled 2020-06-02: qty 171

## 2020-06-02 MED ORDER — STERILE WATER FOR INJECTION IV SOLN
INTRAVENOUS | Status: DC
  Filled 2020-06-02 (×2): qty 850
  Filled 2020-06-02 (×2): qty 150
  Filled 2020-06-02: qty 850

## 2020-06-02 MED ORDER — SODIUM CHLORIDE 0.9 % IV SOLN
15.0000 mg/kg | Freq: Once | INTRAVENOUS | Status: AC
  Administered 2020-06-02: 680 mg via INTRAVENOUS
  Filled 2020-06-02: qty 0.68

## 2020-06-02 MED ORDER — SODIUM CHLORIDE 0.9% FLUSH
3.0000 mL | Freq: Two times a day (BID) | INTRAVENOUS | Status: DC
  Administered 2020-06-02 – 2020-06-09 (×10): 3 mL via INTRAVENOUS

## 2020-06-02 MED ORDER — HALOPERIDOL LACTATE 5 MG/ML IJ SOLN
5.0000 mg | Freq: Once | INTRAMUSCULAR | Status: DC
  Filled 2020-06-02: qty 1

## 2020-06-02 MED ORDER — ENOXAPARIN SODIUM 40 MG/0.4ML ~~LOC~~ SOLN
40.0000 mg | SUBCUTANEOUS | Status: DC
  Administered 2020-06-02 – 2020-06-03 (×2): 40 mg via SUBCUTANEOUS
  Filled 2020-06-02 (×6): qty 0.4

## 2020-06-02 NOTE — ED Notes (Addendum)
Per PC they recommend continuing supportive care and calling if anything changes.

## 2020-06-02 NOTE — ED Notes (Signed)
Pharmacy called and reminded to tube up sodium bicarbonate.

## 2020-06-02 NOTE — ED Notes (Signed)
Spoke with Caryn Bee at the poison control.

## 2020-06-02 NOTE — ED Notes (Signed)
Spoke to poison control and recommending repeating electrolyte levels.

## 2020-06-02 NOTE — Progress Notes (Signed)
PROGRESS NOTE    Tiffany Wong  WLN:[id number] DOB: 1991-01-01 DOA: 06/01/2020 PCP: Medicine, [facility]   Brief Narrative: Patient is a 28 year old female with history of depression/polysubstance abuse/bipolar disorder , Suicidal attempts in the past who presented to the emergency department with complaints of intentional Tylenol overdose.  She reported ingesting 3 bottles of Tylenol.  On her presentation, she was somnolent, later agitated.  Poison control was contacted.  She was also hypotensive on presentation.  Tylenol level was more than 500.  UDS was positive for MDMA, cocaine, THC.  She was started on IV fluids, NAC protocol.  Given a dose of fomepizole.  She endorses that she took the pills to kill herself because she felt depressed.  Patient has been IVC.  Plan for admission to inpatient psych after medical stabilization.  Also started on bicarb drip for severe acidosis.     Assessment & Plan:   Principal Problem:   Intentional acetaminophen overdose (HCC) Active Problems:   Polysubstance abuse (HCC)   Hypokalemia   Agitation   Tylenol overdose: Ingested about 70 g of Tylenol. Tylenol level is more than 500.  LFTs trending up.  INR of 1.7.  Started on NAC protocol, given a dose of fomepizole.  Poison control contacted.  Continue to monitor liver enzymes.  Metabolic acidosis: Secondary to Tylenol overdose.  Started on bicarb drip.  Suicidal attempt: Patient ingested Tylenol with suicidal attempt.  She has history of suicidal attempts in the past.  She has history of depression/bipolar disorder.  Psychiatry consulted.  Recommended inpatient psychiatric admission when medically stable.  Patient has been IVCed.  Polysubstance abuse: UDS positive for hematometra, cocaine, THC.  Counseled for cessation.  Hypokalemia: Being supplemented with potassium.  Agitation: Became combative and agitated earlier and was given Geodon.  She was calm and cooperative during my  evaluation.           DVT prophylaxis:Lovenox Code Status: Full Family Communication: None present at the bedside Status is: Inpatient  Remains inpatient appropriate because:IV treatments appropriate due to intensity of illness or inability to take PO   Dispo: The patient is from: Home              Anticipated d/c is to: Inpatient psych              Anticipated d/c date is: 3 days              Patient currently is not medically stable to d/c.      Consultants: Psych  Procedures:None  Antimicrobials:  Anti-infectives (From admission, onward)   None      Subjective: Patient seen and examined at bedside this morning.  Hemodynamically stable during my evaluation.  Complains of nausea but denies any abdominal pain.  Admitted that she has suicidal ideations.  Objective: Vitals:   06/02/20 1203 06/02/20 1230 06/02/20 1301 06/02/20 1332  BP: 103/86 110/74 100/61 111/65  Pulse: 75 78 68 76  Resp: 20 20 19 18   Temp:      TempSrc:      SpO2: 100% 100% 100% 100%  Weight:      Height:        Intake/Output Summary (Last 24 hours) at 06/02/2020 1342 Last data filed at 06/02/2020 1006 Gross per 24 hour  Intake 3063.05 ml  Output --  Net 3063.05 ml   Filed Weights   06/01/20 2335  Weight: 45.4 kg    Examination:  General exam: Appears calm and comfortable ,Not in  distress,average built HEENT:PERRL,Oral mucosa moist, Ear/Nose normal on gross exam Respiratory system: Bilateral equal air entry, normal vesicular breath sounds, no wheezes or crackles  Cardiovascular system: S1 & S2 heard, RRR. No JVD, murmurs, rubs, gallops or clicks. No pedal edema. Gastrointestinal system: Abdomen is nondistended, soft and nontender. No organomegaly or masses felt. Normal bowel sounds heard. Central nervous system: Alert and oriented. No focal neurological deficits. Extremities: No edema, no clubbing ,no cyanosis Skin: No rashes, lesions or ulcers,no icterus ,no pallor   Data  Reviewed: I have personally reviewed following labs and imaging studies  CBC: Recent Labs  Lab 06/01/20 2340 06/02/20 0319  WBC 7.7 5.8  NEUTROABS 5.8  --   HGB 11.5* 11.9*  HCT 34.3* 35.8*  MCV 88.2 88.6  PLT 220 219   Basic Metabolic Panel: Recent Labs  Lab 06/01/20 2340 06/02/20 0319 06/02/20 0950  NA 140 139 139  K 3.3* 3.0* 4.5  CL 109 111 111  CO2 14* 10* 12*  GLUCOSE 132* 88 135*  BUN 12 10 9   CREATININE 0.83 0.77 0.62  CALCIUM 8.1* 7.9* 8.0*  MG  --  2.1  --    GFR: Estimated Creatinine Clearance: 73.7 mL/min (by C-G formula based on SCr of 0.62 mg/dL). Liver Function Tests: Recent Labs  Lab 06/01/20 2340 06/02/20 0319 06/02/20 0950  AST 28 66* 101*  ALT 17 65* 122*  ALKPHOS 64 64 75  BILITOT 0.7 1.0 1.9*  PROT 7.1 6.7 7.9  ALBUMIN 4.3 4.0 4.5   Recent Labs  Lab 06/01/20 2340  LIPASE 26   No results for input(s): AMMONIA in the last 168 hours. Coagulation Profile: Recent Labs  Lab 06/02/20 0319  INR 1.7*   Cardiac Enzymes: No results for input(s): CKTOTAL, CKMB, CKMBINDEX, TROPONINI in the last 168 hours. BNP (last 3 results) No results for input(s): PROBNP in the last 8760 hours. HbA1C: No results for input(s): HGBA1C in the last 72 hours. CBG: No results for input(s): GLUCAP in the last 168 hours. Lipid Profile: No results for input(s): CHOL, HDL, LDLCALC, TRIG, CHOLHDL, LDLDIRECT in the last 72 hours. Thyroid Function Tests: Recent Labs    06/02/20 0319  TSH 0.420   Anemia Panel: Recent Labs    06/02/20 0319  TIBC 341  IRON 64   Sepsis Labs: Recent Labs  Lab 06/02/20 06/04/20  LATICACIDVEN 2.5*    Recent Results (from the past 240 hour(s))  SARS Coronavirus 2 by RT PCR (hospital order, performed in [facility] hospital lab) Nasopharyngeal Nasopharyngeal Swab     Status: None   Collection Time: 06/02/20 12:55 AM   Specimen: Nasopharyngeal Swab  Result Value Ref Range Status   SARS Coronavirus 2 NEGATIVE NEGATIVE Final      Comment: (NOTE) SARS-CoV-2 target nucleic acids are NOT DETECTED.  The SARS-CoV-2 RNA is generally detectable in upper and lower respiratory specimens during the acute phase of infection. The lowest concentration of SARS-CoV-2 viral copies this assay can detect is 250 copies / mL. A negative result does not preclude SARS-CoV-2 infection and should not be used as the sole basis for treatment or other patient management decisions.  A negative result may occur with improper specimen collection / handling, submission of specimen other than nasopharyngeal swab, presence of viral mutation(s) within the areas targeted by this assay, and inadequate number of viral copies (<250 copies / mL). A negative result must be combined with clinical observations, patient history, and epidemiological information.  Fact Sheet for Patients:  BoilerBrush.com.cy  Fact Sheet for Healthcare Providers: https://pope.com/  This test is not yet approved or  cleared by the [place] FDA and has been authorized for detection and/or diagnosis of SARS-CoV-2 by FDA under an Emergency Use Authorization (EUA).  This EUA will remain in effect (meaning this test can be used) for the duration of the COVID-19 declaration under Section 564(b)(1) of the Act, 21 U.S.C. section 360bbb-3(b)(1), unless the authorization is terminated or revoked sooner.  Performed at [facility], [address] [place], [place] [address]          Radiology Studies: No results found.      Scheduled Meds: . enoxaparin (LOVENOX) injection  40 mg Subcutaneous Q24H  . sodium chloride flush  3 mL Intravenous Q12H  . sterile water (preservative free)       Continuous Infusions: . acetylcysteine 15 mg/kg/hr (06/02/20 0218)  .  sodium bicarbonate (isotonic) infusion in sterile water 100 mL/hr at 06/02/20 1129     LOS: 0 days    Time spent: More than 50% of  that time was spent in counseling and/or coordination of care.      Burnadette Pop, MD [facility] P8/16/2021, 1:42 PM

## 2020-06-02 NOTE — ED Notes (Signed)
Date and time results received: 06/02/20  1244   (use smartphrase ".now" to insert current time)  Test: tylenol Critical Value: <500  Name of Provider Notified: Dr. Rolley Sims  Orders Received? Or Actions Taken?: awaiting orders

## 2020-06-02 NOTE — ED Notes (Addendum)
Date and time results received: 06/02/20 0422 (use smartphrase ".now" to insert current time)  Test: hiv Critical Value: non reactive  Name of Provider Notified: Oypd Md  Orders Received? Or Actions Taken?: NO new orders
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
## 2020-06-02 NOTE — ED Notes (Signed)
Date and time results received: 06/02/20 0730 (use smartphrase ".now" to insert current time)  Test:Lacic Acid Critical Value:  2.5  Name of Provider Notified: [place] RN will update Dr Renford Dills  Orders Received? Or Actions Taken?:

## 2020-06-02 NOTE — ED Notes (Signed)
RT at the bedside to obtain arterial blood gas.

## 2020-06-02 NOTE — ED Notes (Signed)
Patient is resting comfortably. 

## 2020-06-02 NOTE — Consult Note (Signed)
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 IVC as she is high risk to complete suicidality.   Maryagnes Amos, FNP 06/02/2020 1:00 PM

## 2020-06-02 NOTE — ED Notes (Signed)
Patient became extremely agitated, abusive and combative stating " I'm going home and you can't stop me, I will kill myself at home".  Patient began pulling ou her IVs and the electrodes from her chest.  Staff attempted to talk to the patient to calm her down, but she began screaming and yelling and continuing to yell, curse, and fight.  MD and security at the bedside.

## 2020-06-03 ENCOUNTER — Ambulatory Visit: Payer: Medicaid Other | Admitting: Obstetrics and Gynecology

## 2020-06-03 DIAGNOSIS — R4589 Other symptoms and signs involving emotional state: Secondary | ICD-10-CM

## 2020-06-03 HISTORY — DX: Other symptoms and signs involving emotional state: R45.89

## 2020-06-03 LAB — HEPATIC FUNCTION PANEL
ALT: 2711 U/L — ABNORMAL HIGH (ref 0–44)
AST: 3046 U/L — ABNORMAL HIGH (ref 15–41)
Albumin: 3.4 g/dL — ABNORMAL LOW (ref 3.5–5.0)
Alkaline Phosphatase: 62 U/L (ref 38–126)
Bilirubin, Direct: 0.2 mg/dL (ref 0.0–0.2)
Indirect Bilirubin: 0.8 mg/dL (ref 0.3–0.9)
Total Bilirubin: 1 mg/dL (ref 0.3–1.2)
Total Protein: 6.1 g/dL — ABNORMAL LOW (ref 6.5–8.1)

## 2020-06-03 LAB — COMPREHENSIVE METABOLIC PANEL
ALT: 114 U/L — ABNORMAL HIGH (ref 0–44)
AST: 99 U/L — ABNORMAL HIGH (ref 15–41)
Albumin: 3.3 g/dL — ABNORMAL LOW (ref 3.5–5.0)
Alkaline Phosphatase: 65 U/L (ref 38–126)
Anion gap: 8 (ref 5–15)
BUN: 6 mg/dL (ref 6–20)
CO2: 22 mmol/L (ref 22–32)
Calcium: 8.4 mg/dL — ABNORMAL LOW (ref 8.9–10.3)
Chloride: 108 mmol/L (ref 98–111)
Creatinine, Ser: 0.63 mg/dL (ref 0.44–1.00)
GFR calc Af Amer: >60 mL/min (ref >60)
GFR calc non Af Amer: >60 mL/min (ref >60)
Glucose, Bld: 122 mg/dL — ABNORMAL HIGH (ref 70–99)
Potassium: 3.4 mmol/L — ABNORMAL LOW (ref 3.5–5.1)
Sodium: 138 mmol/L (ref 135–145)
Total Bilirubin: 0.6 mg/dL (ref 0.3–1.2)
Total Protein: 5.8 g/dL — ABNORMAL LOW (ref 6.5–8.1)

## 2020-06-03 LAB — CBC
HCT: 30.3 % — ABNORMAL LOW (ref 36.0–46.0)
Hemoglobin: 10.4 g/dL — ABNORMAL LOW (ref 12.0–15.0)
MCH: 29.7 pg (ref 26.0–34.0)
MCHC: 34.3 g/dL (ref 30.0–36.0)
MCV: 86.6 fL (ref 80.0–100.0)
Platelets: 171 10*3/uL (ref 150–400)
RBC: 3.5 MIL/uL — ABNORMAL LOW (ref 3.87–5.11)
RDW: 12.5 % (ref 11.5–15.5)
WBC: 7.7 10*3/uL (ref 4.0–10.5)
nRBC: 0 % (ref 0.0–0.2)

## 2020-06-03 LAB — PROTIME-INR
INR: 1.7 — ABNORMAL HIGH (ref 0.8–1.2)
INR: 2.1 — ABNORMAL HIGH (ref 0.8–1.2)
Prothrombin Time: 19.5 seconds — ABNORMAL HIGH (ref 11.4–15.2)
Prothrombin Time: 22.8 seconds — ABNORMAL HIGH (ref 11.4–15.2)

## 2020-06-03 LAB — SARS CORONAVIRUS 2 BY RT PCR (HOSPITAL ORDER, PERFORMED IN ~~LOC~~ HOSPITAL LAB): SARS Coronavirus 2: NEGATIVE

## 2020-06-03 LAB — ACETAMINOPHEN LEVEL: Acetaminophen (Tylenol), Serum: <10 ug/mL — ABNORMAL LOW (ref 10–30)

## 2020-06-03 MED ORDER — SODIUM CHLORIDE 0.9 % IV SOLN: INTRAVENOUS | Status: DC

## 2020-06-03 MED ORDER — PANTOPRAZOLE SODIUM 40 MG PO TBEC
40.0000 mg | DELAYED_RELEASE_TABLET | Freq: Every day | ORAL | Status: DC
  Administered 2020-06-04 – 2020-06-10 (×7): 40 mg via ORAL
  Filled 2020-06-03 (×7): qty 1

## 2020-06-03 MED ORDER — KETOROLAC TROMETHAMINE 15 MG/ML IJ SOLN
15.0000 mg | Freq: Once | INTRAMUSCULAR | Status: AC
  Administered 2020-06-03: 15 mg via INTRAVENOUS
  Filled 2020-06-03: qty 1

## 2020-06-03 NOTE — Progress Notes (Signed)
Patient ID: Tiffany Wong, female   DOB: 10-09-1991, 29 y.o.   MRN: [id number] Patient with recent suicide attempt by overdose of over 70 grams of Tylenol. Tylenol levels are now within normal range, however Lactic acid and LFTs are now trending up. She has not been medically cleared and is awaiting inpatient admission. At this time she is also being closely followed by poison control due to her large ingestion of Tylenol. Patient also exposed to COVID yesterday while in the Emergency Room, it is not clear if she needs to quarantine or start isolation precautions. Patient was not wearing a mask yesterday when assessed by this nurse practitioner.  -She continues to meet inpatient psychiatric admission once she is medically stable.  -At this time will not start medications as patient labs remain abnormal, and she was not taking any medications prior to admission.  -Reconsult psychiatry once patient is medically cleared and stable.

## 2020-06-03 NOTE — ED Notes (Signed)
Spoke to poison control, requesting a repeat acetaminophen level and a repeat CMP

## 2020-06-03 NOTE — Progress Notes (Signed)
Contacted [facility] regarding repeat labs; since drawn sooner than 12 hrs after previous labs, PC recommended repeating LFTs and INR 12 hrs after last labs (~0530). Labs ordered for 1800 today.  Bernadene Person, PharmD, BCPS [phone] 06/03/2020, 12:13 PM

## 2020-06-03 NOTE — ED Notes (Signed)
Notified by hospitalist that we need to start precautions and re-COVID-19 swab this pt since she was accidentally exposed earlier.

## 2020-06-03 NOTE — ED Notes (Signed)
Dinner tray was given. Nurse aware.  

## 2020-06-03 NOTE — ED Notes (Signed)
Report given to Terri, RN.

## 2020-06-03 NOTE — Progress Notes (Signed)
PROGRESS NOTE    Tiffany Wong  OIZ:[id number] DOB: [birth year] DOA: 06/01/2020 PCP: Medicine, [facility]   Brief Narrative: Patient is a 29 year old female with history of depression/polysubstance abuse/bipolar disorder , Suicidal attempts in the past who presented to the emergency department with complaints of intentional Tylenol overdose.  She reported ingesting 3 bottles of Tylenol.  On her presentation, she was somnolent, later agitated.  Poison control was contacted.  She was also hypotensive on presentation.  Tylenol level was more than 500.  UDS was positive for MDMA, cocaine, THC.  She was started on IV fluids, NAC protocol.  Given a dose of fomepizole.  She endorses that she took the pills to kill herself because she felt depressed.  Patient has been IVCed.  Plan for admission to inpatient psych after medical stabilization.  Also started on bicarb drip for severe acidosis.     Assessment & Plan:   Principal Problem:   Intentional acetaminophen overdose (HCC) Active Problems:   Polysubstance abuse (HCC)   Hypokalemia   Agitation   Suicidal behavior   Tylenol overdose: Ingested about 70 g of Tylenol. Tylenol level is more than 500.  LFTs trended up ,now stablised.  INR of 1.7 on presentation.  Started on NAC protocol, given a dose of fomepizole.  Poison control contacted.  Continue to monitor liver enzymes.  Metabolic acidosis: Secondary to Tylenol overdose.  Started on bicarb drip.  CO2 in the range of 20s today.  Bicarb drip was stopped.  Suicidal attempt: Patient ingested Tylenol with suicidal attempt.  She has history of suicidal attempts in the past.  She has history of depression/bipolar disorder.  Psychiatry consulted.  Recommended inpatient psychiatric admission when medically stable.  Patient has been IVCed.  Polysubstance abuse: UDS positive for hematometra, cocaine, THC.  Counseled for cessation.  Hypokalemia: Being supplemented with  potassium.  Agitation: Became combative and agitated earlier and was given Geodon.  She was calm and cooperative during my evaluation.  Abdomen pain/nausea: Continue Protonix, antiemetics.  This is most likely from gastric irritation from Tylenol overdose.  Accidental exposure to Covid patient: While in the emergency department, another patient who was in the same room with her ,was diagnosed with Covid.  Repeat Covid test done today is negative.  She is asymptomatic.  I do not think we need to isolate her           DVT prophylaxis:Lovenox Code Status: Full Family Communication: None present at the bedside Status is: Inpatient  Remains inpatient appropriate because:IV treatments appropriate due to intensity of illness or inability to take PO   Dispo: The patient is from: Home              Anticipated d/c is to: Inpatient psych              Anticipated d/c date is: 1-2 days              Patient currently is not medically stable to d/c. Will finish the course of NAC.  Will check liver enzymes tomorrow and she might be ready for tomorrow for inpatient psych transfer.     Consultants: Psych  Procedures:None  Antimicrobials:  Anti-infectives (From admission, onward)   None      Subjective: Patient seen and examined the bedside this morning.  Hemodynamically stable.  Denies any abdomen pain for me but I got the report that she vomited later on.  Hemodynamically stable.  Objective: Vitals:   06/03/20 1211 06/03/20 1247 06/03/20  1330 06/03/20 1400  BP: 110/72 115/67 (!) 105/46 (!) 103/57  Pulse: (!) 102 (!) 101 (!) 102 (!) 104  Resp: (!) 22 (!) 21 17 (!) 28  Temp:      TempSrc:      SpO2: 100% 100% 100% 100%  Weight:      Height:        Intake/Output Summary (Last 24 hours) at 06/03/2020 1421 Last data filed at 06/02/2020 1715 Gross per 24 hour  Intake 1099 ml  Output --  Net 1099 ml   Filed Weights   06/01/20 2335  Weight: 45.4 kg     Examination:  General exam: Appears calm and comfortable ,Not in distress,average built HEENT:PERRL,Oral mucosa moist, Ear/Nose normal on gross exam Respiratory system: Bilateral equal air entry, normal vesicular breath sounds, no wheezes or crackles  Cardiovascular system: S1 & S2 heard, RRR. No JVD, murmurs, rubs, gallops or clicks. Gastrointestinal system: Abdomen is nondistended, soft and nontender. No organomegaly or masses felt. Normal bowel sounds heard. Central nervous system: Alert and oriented. No focal neurological deficits. Extremities: No edema, no clubbing ,no cyanosis Skin: No rashes, lesions or ulcers,no icterus ,no pallor   Data Reviewed: I have personally reviewed following labs and imaging studies  CBC: Recent Labs  Lab 06/01/20 2340 06/02/20 0319 06/03/20 0513  WBC 7.7 5.8 7.7  NEUTROABS 5.8  --   --   HGB 11.5* 11.9* 10.4*  HCT 34.3* 35.8* 30.3*  MCV 88.2 88.6 86.6  PLT 220 219 171   Basic Metabolic Panel: Recent Labs  Lab 06/01/20 2340 06/02/20 0319 06/02/20 0950 06/02/20 2200 06/03/20 0513  NA 140 139 139 137 138  K 3.3* 3.0* 4.5 3.8 3.4*  CL 109 111 111 110 108  CO2 14* 10* 12* 17* 22  GLUCOSE 132* 88 135* 108* 122*  BUN 12 10 9 8 6   CREATININE 0.83 0.77 0.62 0.62 0.63  CALCIUM 8.1* 7.9* 8.0* 8.4* 8.4*  MG  --  2.1  --   --   --    GFR: Estimated Creatinine Clearance: 73.7 mL/min (by C-G formula based on SCr of 0.63 mg/dL). Liver Function Tests: Recent Labs  Lab 06/01/20 2340 06/02/20 0319 06/02/20 0950 06/02/20 2200 06/03/20 0513  AST 28 66* 101* 68* 99*  ALT 17 65* 122* 98* 114*  ALKPHOS 64 64 75 72 65  BILITOT 0.7 1.0 1.9* 1.2 0.6  PROT 7.1 6.7 7.9 6.5 5.8*  ALBUMIN 4.3 4.0 4.5 3.7 3.3*   Recent Labs  Lab 06/01/20 2340  LIPASE 26   No results for input(s): AMMONIA in the last 168 hours. Coagulation Profile: Recent Labs  Lab 06/02/20 0319 06/03/20 0513  INR 1.7* 1.7*   Cardiac Enzymes: No results for input(s):  CKTOTAL, CKMB, CKMBINDEX, TROPONINI in the last 168 hours. BNP (last 3 results) No results for input(s): PROBNP in the last 8760 hours. HbA1C: No results for input(s): HGBA1C in the last 72 hours. CBG: No results for input(s): GLUCAP in the last 168 hours. Lipid Profile: No results for input(s): CHOL, HDL, LDLCALC, TRIG, CHOLHDL, LDLDIRECT in the last 72 hours. Thyroid Function Tests: Recent Labs    06/02/20 0319  TSH 0.420   Anemia Panel: Recent Labs    06/02/20 0319  TIBC 341  IRON 64   Sepsis Labs: Recent Labs  Lab 06/02/20 [id number]  LATICACIDVEN 2.5*    Recent Results (from the past 240 hour(s))  SARS Coronavirus 2 by RT PCR (hospital order, [facility]) Nasopharyngeal Nasopharyngeal Swab     Status: None   Collection Time: 06/02/20 12:55 AM   Specimen: Nasopharyngeal Swab  Result Value Ref Range Status   SARS Coronavirus 2 NEGATIVE NEGATIVE Final    Comment: (NOTE) SARS-CoV-2 target nucleic acids are NOT DETECTED.  The SARS-CoV-2 RNA is generally detectable in upper and lower respiratory specimens during the acute phase of infection. The lowest concentration of SARS-CoV-2 viral copies this assay can detect is 250 copies / mL. A negative result does not preclude SARS-CoV-2 infection and should not be used as the sole basis for treatment or other patient management decisions.  A negative result may occur with improper specimen collection / handling, submission of specimen other than nasopharyngeal swab, presence of viral mutation(s) within the areas targeted by this assay, and inadequate number of viral copies (<250 copies / mL). A negative result must be combined with clinical observations, patient history, and epidemiological information.  Fact Sheet for Patients:   BoilerBrush.com.cy  Fact Sheet for Healthcare Providers: https://pope.com/  This test is not yet approved or  cleared by the  [place] FDA and has been authorized for detection and/or diagnosis of SARS-CoV-2 by FDA under an Emergency Use Authorization (EUA).  This EUA will remain in effect (meaning this test can be used) for the duration of the COVID-19 declaration under Section 564(b)(1) of the Act, 21 U.S.C. section 360bbb-3(b)(1), unless the authorization is terminated or revoked sooner.  Performed at [facility], [address], [place], [place] [address]   SARS Coronavirus 2 by RT PCR (hospital order, performed in [facility] hospital lab) Nasopharyngeal Nasopharyngeal Swab     Status: None   Collection Time: 06/03/20 10:06 AM   Specimen: Nasopharyngeal Swab  Result Value Ref Range Status   SARS Coronavirus 2 NEGATIVE NEGATIVE Final    Comment: (NOTE) SARS-CoV-2 target nucleic acids are NOT DETECTED.  The SARS-CoV-2 RNA is generally detectable in upper and lower respiratory specimens during the acute phase of infection. The lowest concentration of SARS-CoV-2 viral copies this assay can detect is 250 copies / mL. A negative result does not preclude SARS-CoV-2 infection and should not be used as the sole basis for treatment or other patient management decisions.  A negative result may occur with improper specimen collection / handling, submission of specimen other than nasopharyngeal swab, presence of viral mutation(s) within the areas targeted by this assay, and inadequate number of viral copies (<250 copies / mL). A negative result must be combined with clinical observations, patient history, and epidemiological information.  Fact Sheet for Patients:   BoilerBrush.com.cy  Fact Sheet for Healthcare Providers: https://pope.com/  This test is not yet approved or  cleared by the [place] FDA and has been authorized for detection and/or diagnosis of SARS-CoV-2 by FDA under an Emergency Use Authorization (EUA).  This EUA will  remain in effect (meaning this test can be used) for the duration of the COVID-19 declaration under Section 564(b)(1) of the Act, 21 U.S.C. section 360bbb-3(b)(1), unless the authorization is terminated or revoked sooner.  Performed at [facility], [address], [place], [place] [address]          Radiology Studies: No results found.      Scheduled Meds: . enoxaparin (LOVENOX) injection  40 mg Subcutaneous Q24H  . pantoprazole  40 mg Oral Daily  . sodium chloride flush  3 mL Intravenous Q12H   Continuous Infusions: . acetylcysteine 15 mg/kg/hr (06/02/20 0218)  .  sodium bicarbonate (isotonic) infusion in sterile water 100 mL/hr at 06/02/20 2039     LOS: 1 day    Time spent: 25 mins.More than 50% of that time was spent in counseling and/or coordination of care.      Burnadette Pop, MD [facility] P8/17/2021, 2:21 PM

## 2020-06-03 NOTE — Progress Notes (Signed)
Pharmacy Progress Note - N-AC Follow Up:  Called and reported most recent labs drawn on 8/17 @1849  to .   Assessment:  AST = 3,046, ALT = 2,711. Significantly increased  INR = 2.1  Most recent vitals @ 1934: BP 114/71, HR 100, Resp 20; O2 sats 100% on room air  Plan per recommendations from poison control:  Continue N-acetylcysteine at current rate of 15 mg/kg/hr and repeat labs in 24 hours  Check: BMP, INR, LFTs in 24 hours  No need to recheck APAP level as most recent was undetectable  Monitor for fever  07-28-1973, PharmD 06/03/20 8:19 PM

## 2020-06-04 ENCOUNTER — Inpatient Hospital Stay (HOSPITAL_COMMUNITY): Payer: Medicaid Other

## 2020-06-04 DIAGNOSIS — R195 Other fecal abnormalities: Secondary | ICD-10-CM

## 2020-06-04 DIAGNOSIS — D689 Coagulation defect, unspecified: Secondary | ICD-10-CM

## 2020-06-04 DIAGNOSIS — T391X2A Poisoning by 4-Aminophenol derivatives, intentional self-harm, initial encounter: Principal | ICD-10-CM

## 2020-06-04 DIAGNOSIS — R7989 Other specified abnormal findings of blood chemistry: Secondary | ICD-10-CM

## 2020-06-04 LAB — BASIC METABOLIC PANEL
Anion gap: 7 (ref 5–15)
BUN: 6 mg/dL (ref 6–20)
CO2: 21 mmol/L — ABNORMAL LOW (ref 22–32)
Calcium: 8.1 mg/dL — ABNORMAL LOW (ref 8.9–10.3)
Chloride: 111 mmol/L (ref 98–111)
Creatinine, Ser: 0.48 mg/dL (ref 0.44–1.00)
GFR calc Af Amer: >60 mL/min (ref >60)
GFR calc non Af Amer: >60 mL/min (ref >60)
Glucose, Bld: 92 mg/dL (ref 70–99)
Potassium: 3.7 mmol/L (ref 3.5–5.1)
Sodium: 139 mmol/L (ref 135–145)

## 2020-06-04 LAB — COMPREHENSIVE METABOLIC PANEL
ALT: 2018 U/L — ABNORMAL HIGH (ref 0–44)
AST: 1585 U/L — ABNORMAL HIGH (ref 15–41)
Albumin: 3.2 g/dL — ABNORMAL LOW (ref 3.5–5.0)
Alkaline Phosphatase: 63 U/L (ref 38–126)
Anion gap: 8 (ref 5–15)
BUN: 6 mg/dL (ref 6–20)
CO2: 24 mmol/L (ref 22–32)
Calcium: 8.1 mg/dL — ABNORMAL LOW (ref 8.9–10.3)
Chloride: 108 mmol/L (ref 98–111)
Creatinine, Ser: 0.43 mg/dL — ABNORMAL LOW (ref 0.44–1.00)
GFR calc Af Amer: >60 mL/min (ref >60)
GFR calc non Af Amer: >60 mL/min (ref >60)
Glucose, Bld: 87 mg/dL (ref 70–99)
Potassium: 3.1 mmol/L — ABNORMAL LOW (ref 3.5–5.1)
Sodium: 140 mmol/L (ref 135–145)
Total Bilirubin: 0.5 mg/dL (ref 0.3–1.2)
Total Protein: 5.8 g/dL — ABNORMAL LOW (ref 6.5–8.1)

## 2020-06-04 LAB — PROTIME-INR
INR: 1.9 — ABNORMAL HIGH (ref 0.8–1.2)
INR: 1.5 — ABNORMAL HIGH (ref 0.8–1.2)
Prothrombin Time: 21.3 seconds — ABNORMAL HIGH (ref 11.4–15.2)
Prothrombin Time: 17.8 seconds — ABNORMAL HIGH (ref 11.4–15.2)

## 2020-06-04 LAB — CBC
HCT: 31.6 % — ABNORMAL LOW (ref 36.0–46.0)
Hemoglobin: 10.6 g/dL — ABNORMAL LOW (ref 12.0–15.0)
MCH: 29.3 pg (ref 26.0–34.0)
MCHC: 33.5 g/dL (ref 30.0–36.0)
MCV: 87.3 fL (ref 80.0–100.0)
Platelets: 109 10*3/uL — ABNORMAL LOW (ref 150–400)
RBC: 3.62 MIL/uL — ABNORMAL LOW (ref 3.87–5.11)
RDW: 12.6 % (ref 11.5–15.5)
WBC: 5.9 10*3/uL (ref 4.0–10.5)
nRBC: 0 % (ref 0.0–0.2)

## 2020-06-04 LAB — HEPATIC FUNCTION PANEL
ALT: 1540 U/L — ABNORMAL HIGH (ref 0–44)
AST: 629 U/L — ABNORMAL HIGH (ref 15–41)
Albumin: 3.5 g/dL (ref 3.5–5.0)
Alkaline Phosphatase: 62 U/L (ref 38–126)
Bilirubin, Direct: 0.3 mg/dL — ABNORMAL HIGH (ref 0.0–0.2)
Indirect Bilirubin: 1.1 mg/dL — ABNORMAL HIGH (ref 0.3–0.9)
Total Bilirubin: 1.4 mg/dL — ABNORMAL HIGH (ref 0.3–1.2)
Total Protein: 6.3 g/dL — ABNORMAL LOW (ref 6.5–8.1)

## 2020-06-04 IMAGING — CR DG HAND 2V*L*
2 series · 2 of 2 positions shown · non-contrast
Comparison: None.

CLINICAL DATA: Injury

EXAM:
LEFT HAND - 2 VIEW

[x hand ap left]
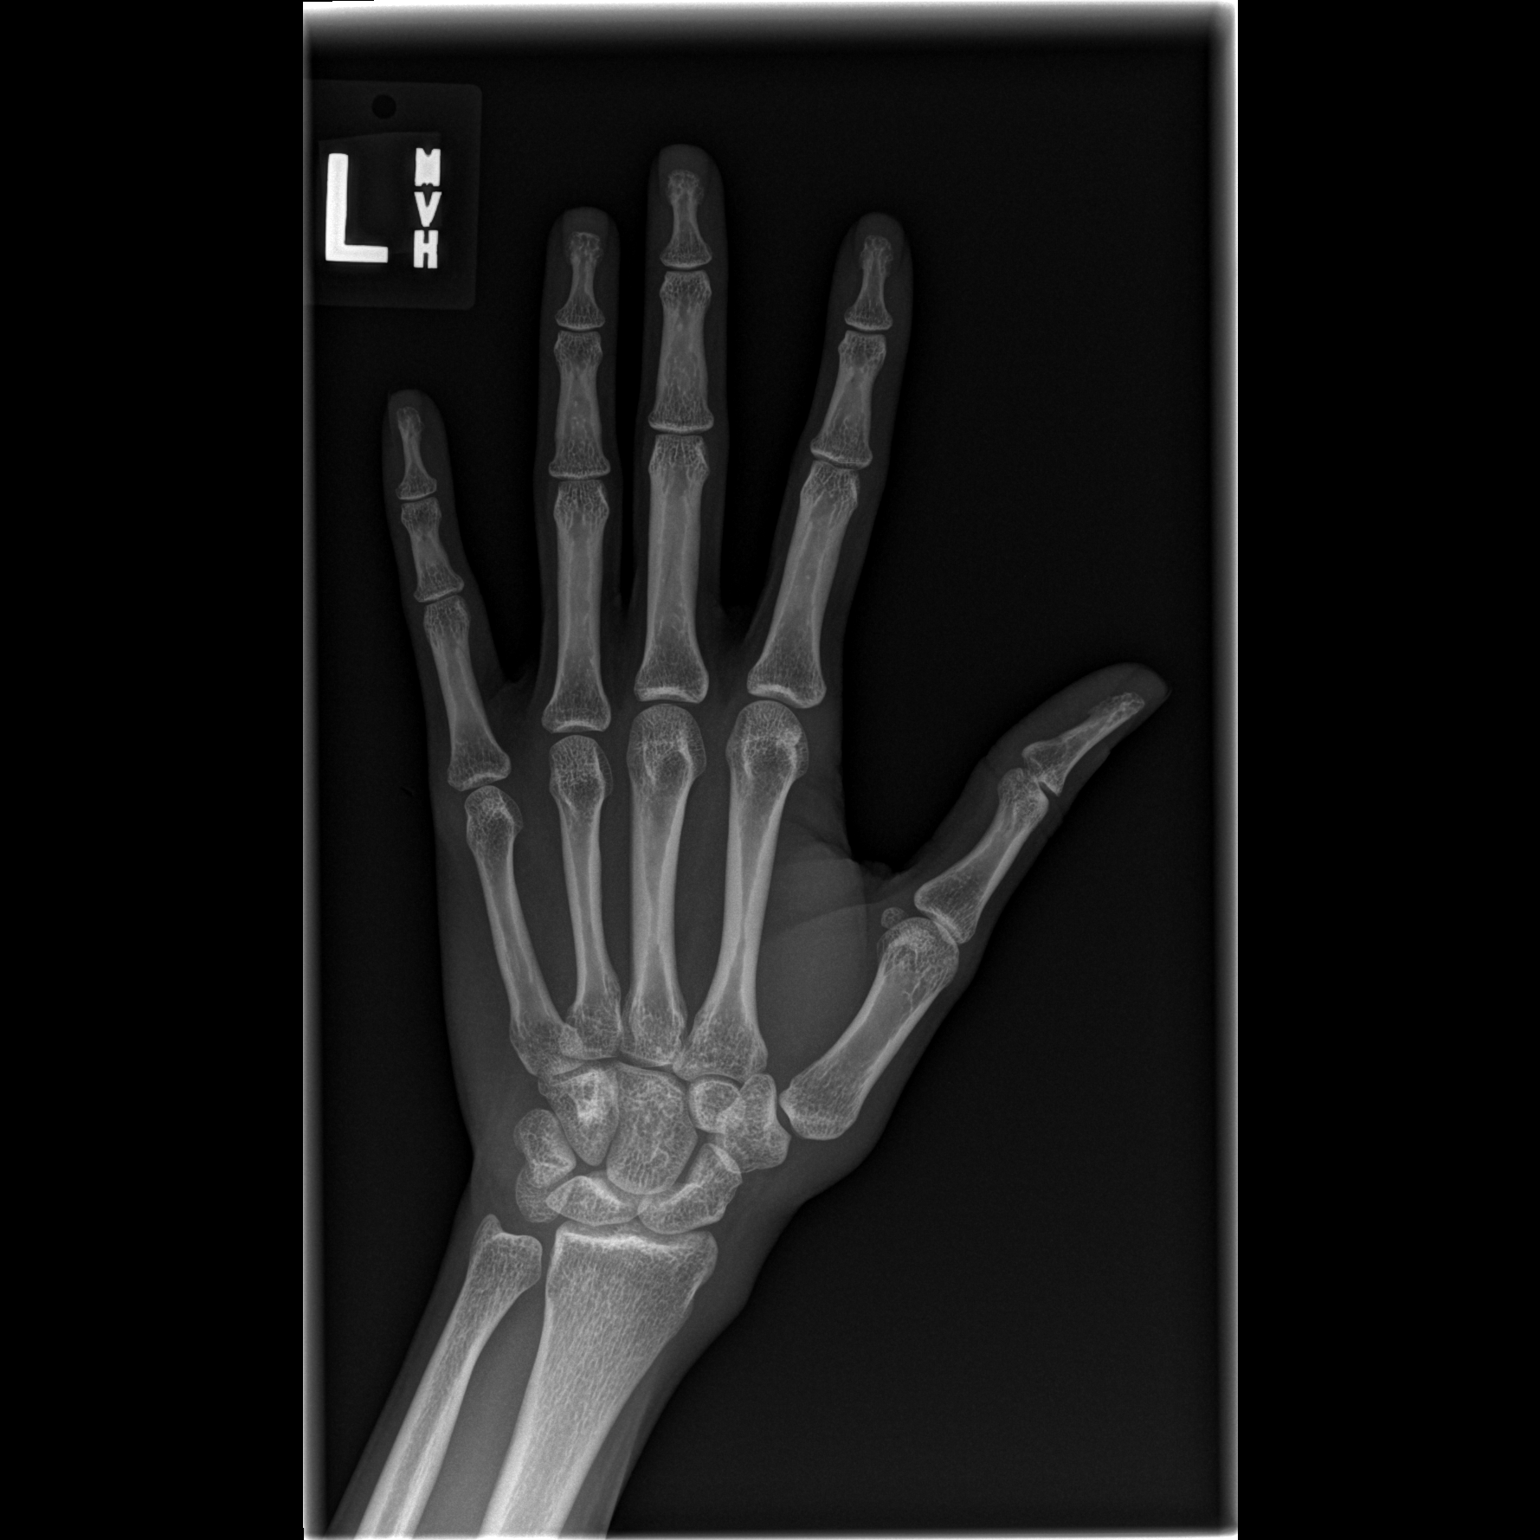

[x hand lat left]
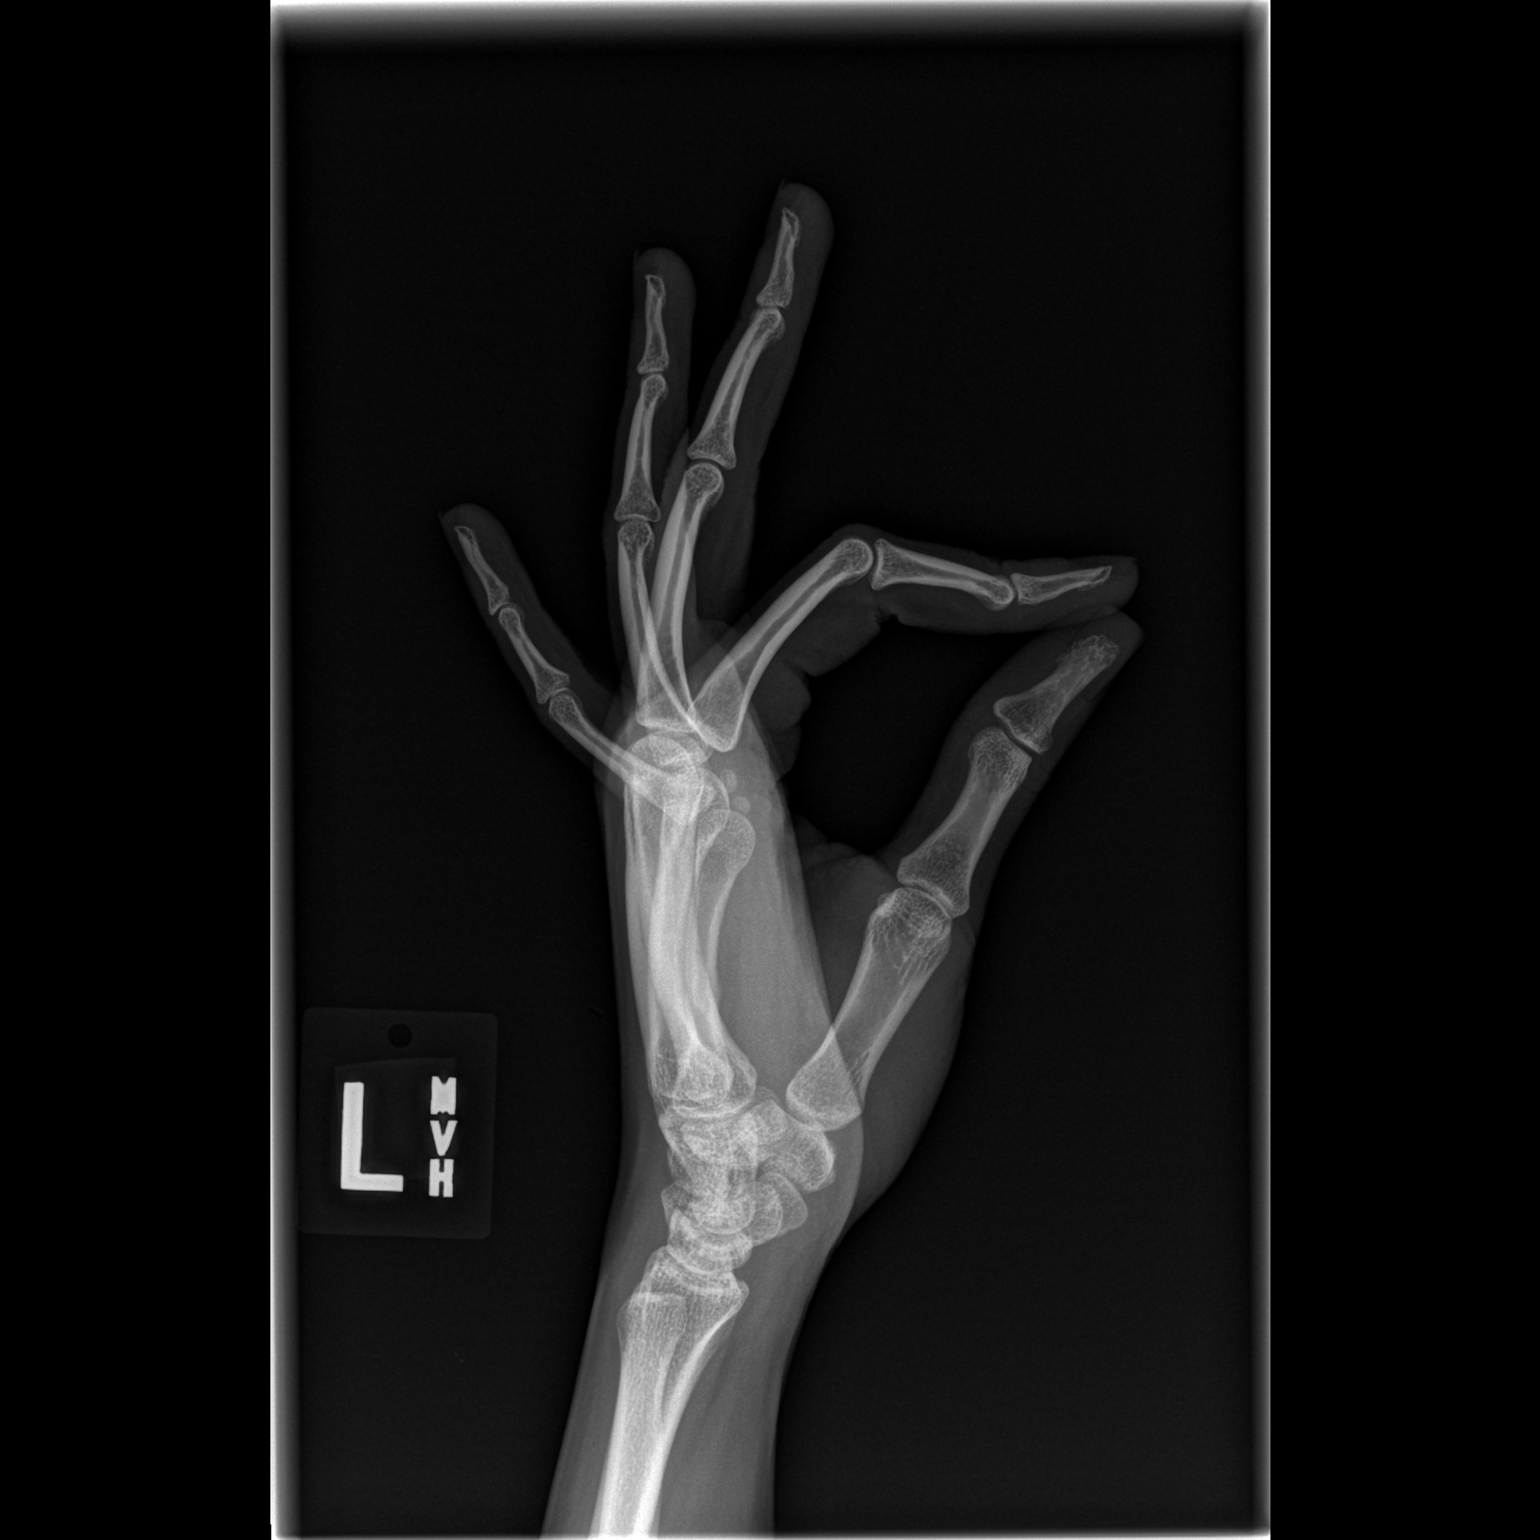

[2 of 2 positions shown; findings below may reference images not displayed]

FINDINGS: There is no evidence of fracture or dislocation. There is no
evidence of arthropathy or other focal bone abnormality. Soft
tissues are unremarkable.
IMPRESSION: Negative.

## 2020-06-04 MED ORDER — POTASSIUM CHLORIDE 10 MEQ/100ML IV SOLN: 10.0000 meq | INTRAVENOUS | Status: DC

## 2020-06-04 MED ORDER — DEXTROSE-NACL 5-0.9 % IV SOLN: INTRAVENOUS | Status: DC

## 2020-06-04 MED ORDER — POTASSIUM CHLORIDE CRYS ER 20 MEQ PO TBCR
40.0000 meq | EXTENDED_RELEASE_TABLET | ORAL | Status: AC
  Administered 2020-06-04 (×2): 40 meq via ORAL
  Filled 2020-06-04 (×2): qty 2

## 2020-06-04 NOTE — Progress Notes (Signed)
PROGRESS NOTE    Tiffany Wong  XEN:[id number] DOB: Feb 28, 1991 DOA: 06/01/2020 PCP: Medicine, [facility]   Brief Narrative: Patient is a 29 year old female with history of depression/polysubstance abuse/bipolar disorder , Suicidal attempts in the past who presented to the emergency department with complaints of intentional Tylenol overdose.  She reported ingesting 3 bottles of Tylenol.  On her presentation, she was somnolent, later agitated.  Poison control was contacted.  She was also hypotensive on presentation.  Tylenol level was more than 500.  UDS was positive for MDMA, cocaine, THC.  She was started on IV fluids, NAC protocol.  Given a dose of fomepizole.  She endorses that she took the pills to kill herself because she felt depressed.  Patient has been IVCed.  Plan for admission to inpatient psych after medical stabilization.  Also started on bicarb drip for severe acidosis.  Her liver enzymes elevated significantly on 06/03/2020 evening.  GI consulted today.     Assessment & Plan:   Principal Problem:   Intentional acetaminophen overdose (HCC) Active Problems:   Polysubstance abuse (HCC)   Hypokalemia   Agitation   Suicidal behavior   Tylenol overdose: Ingested about 70 g of Tylenol. Tylenol level is more than 500 on admission.  LFTs trended up ,now significantly elevated.  INR of 1.7 on presentation,also trended up.  Started on NAC protocol, given a dose of fomepizole.  Poison control was following.  Continue to monitor liver enzymes.  GI consulted today. Plan for right upper quadrant ultrasound.  On clear liquid diet today.  Metabolic acidosis: Secondary to Tylenol overdose.  Started on bicarb drip.  Resolved now  Suicidal attempt: Patient ingested Tylenol with suicidal attempt.  She has history of suicidal attempts in the past.  She has history of depression/bipolar disorder.  Psychiatry consulted.  Recommended inpatient psychiatric admission when medically stable.   Patient has been IVCed.  Polysubstance abuse: UDS positive for hematometra, cocaine, THC.  Counseled for cessation.  Hypokalemia: Being supplemented with potassium.  Abdomen pain/nausea: Continue Protonix, antiemetics.  This is most likely from gastric irritation from Tylenol overdose.  Her abdominal pain has resolved.  Accidental exposure to Covid patient: While in the emergency department, another patient who was in the same room with her ,was diagnosed with Covid.  Repeat Covid test done on 06/03/20 is negative.  She is asymptomatic.  I do not think we need to isolate her           DVT prophylaxis:Lovenox Code Status: Full Family Communication: None present at the bedside Status is: Inpatient  Remains inpatient appropriate because:IV treatments appropriate due to intensity of illness or inability to take PO   Dispo: The patient is from: Home              Anticipated d/c is to: Inpatient psych              Anticipated d/c date is: 2-3 days              Patient currently is not medically stable to d/c. Elevated liver enzymes, need to be on NAC    Consultants: Psych  Procedures:None  Antimicrobials:  Anti-infectives (From admission, onward)   None      Subjective:   Patient seen and examined the bedside this morning.  She was very upset and angry because she has been kept n.p.o. and her liver enzymes went up.  Calmed down after discussion at the bedside.  Looked very comfortable, denies any nausea or  vomiting or abdominal pain.  Objective: Vitals:   06/03/20 2020 06/03/20 2125 06/04/20 0132 06/04/20 0521  BP: 114/61 109/75 113/85 112/83  Pulse: 80 89 77 81  Resp: 20 18 18 19   Temp: 100.1 F (37.8 C) 100.3 F (37.9 C) 98.9 F (37.2 C) 98.8 F (37.1 C)  TempSrc: Oral Oral Oral Oral  SpO2: 100% 100% 100% 100%  Weight:      Height:        Intake/Output Summary (Last 24 hours) at 06/04/2020 0834 Last data filed at 06/04/2020 0600 Gross per 24 hour  Intake  4500.05 ml  Output --  Net 4500.05 ml   Filed Weights   06/01/20 2335  Weight: 45.4 kg    Examination:   General exam: Comfortable Respiratory system: Bilateral equal air entry, normal vesicular breath sounds, no wheezes or crackles  Cardiovascular system: S1 & S2 heard, RRR. No JVD, murmurs, rubs, gallops or clicks. Gastrointestinal system: Abdomen is nondistended, soft and nontender. No organomegaly or masses felt. Normal bowel sounds heard. Central nervous system: Alert and oriented. No focal neurological deficits. Extremities: No edema, no clubbing ,no cyanosis, distal peripheral pulses palpable. Skin: No rashes, lesions or ulcers,no icterus ,no pallor   Data Reviewed: I have personally reviewed following labs and imaging studies  CBC: Recent Labs  Lab 06/01/20 2340 06/02/20 0319 06/03/20 0513 06/04/20 0500  WBC 7.7 5.8 7.7 5.9  NEUTROABS 5.8  --   --   --   HGB 11.5* 11.9* 10.4* 10.6*  HCT 34.3* 35.8* 30.3* 31.6*  MCV 88.2 88.6 86.6 87.3  PLT 220 219 171 109*   Basic Metabolic Panel: Recent Labs  Lab 06/02/20 0319 06/02/20 0950 06/02/20 2200 06/03/20 0513 06/04/20 0500  NA 139 139 137 138 140  K 3.0* 4.5 3.8 3.4* 3.1*  CL 111 111 110 108 108  CO2 10* 12* 17* 22 24  GLUCOSE 88 135* 108* 122* 87  BUN 10 9 8 6 6   CREATININE 0.77 0.62 0.62 0.63 0.43*  CALCIUM 7.9* 8.0* 8.4* 8.4* 8.1*  MG 2.1  --   --   --   --    GFR: Estimated Creatinine Clearance: 73.7 mL/min (A) (by C-G formula based on SCr of 0.43 mg/dL (L)). Liver Function Tests: Recent Labs  Lab 06/02/20 0950 06/02/20 2200 06/03/20 0513 06/03/20 1849 06/04/20 0500  AST 101* 68* 99* 3,046* 1,585*  ALT 122* 98* 114* 2,711* 2,018*  ALKPHOS 75 72 65 62 63  BILITOT 1.9* 1.2 0.6 1.0 0.5  PROT 7.9 6.5 5.8* 6.1* 5.8*  ALBUMIN 4.5 3.7 3.3* 3.4* 3.2*   Recent Labs  Lab 06/01/20 2340  LIPASE 26   No results for input(s): AMMONIA in the last 168 hours. Coagulation Profile: Recent Labs  Lab  06/02/20 0319 06/03/20 0513 06/03/20 1849 06/04/20 0500  INR 1.7* 1.7* 2.1* 1.9*   Cardiac Enzymes: No results for input(s): CKTOTAL, CKMB, CKMBINDEX, TROPONINI in the last 168 hours. BNP (last 3 results) No results for input(s): PROBNP in the last 8760 hours. HbA1C: No results for input(s): HGBA1C in the last 72 hours. CBG: No results for input(s): GLUCAP in the last 168 hours. Lipid Profile: No results for input(s): CHOL, HDL, LDLCALC, TRIG, CHOLHDL, LDLDIRECT in the last 72 hours. Thyroid Function Tests: Recent Labs    06/02/20 0319  TSH 0.420   Anemia Panel: Recent Labs    06/02/20 0319  TIBC 341  IRON 64   Sepsis Labs: Recent Labs  Lab 06/02/20 06/04/20  LATICACIDVEN  2.5*    Recent Results (from the past 240 hour(s))  SARS Coronavirus 2 by RT PCR (hospital order, performed in [facility] hospital lab) Nasopharyngeal Nasopharyngeal Swab     Status: None   Collection Time: 06/02/20 12:55 AM   Specimen: Nasopharyngeal Swab  Result Value Ref Range Status   SARS Coronavirus 2 NEGATIVE NEGATIVE Final    Comment: (NOTE) SARS-CoV-2 target nucleic acids are NOT DETECTED.  The SARS-CoV-2 RNA is generally detectable in upper and lower respiratory specimens during the acute phase of infection. The lowest concentration of SARS-CoV-2 viral copies this assay can detect is 250 copies / mL. A negative result does not preclude SARS-CoV-2 infection and should not be used as the sole basis for treatment or other patient management decisions.  A negative result may occur with improper specimen collection / handling, submission of specimen other than nasopharyngeal swab, presence of viral mutation(s) within the areas targeted by this assay, and inadequate number of viral copies (<250 copies / mL). A negative result must be combined with clinical observations, patient history, and epidemiological information.  Fact Sheet for Patients:    BoilerBrush.com.cy  Fact Sheet for Healthcare Providers: https://pope.com/  This test is not yet approved or  cleared by the [place] FDA and has been authorized for detection and/or diagnosis of SARS-CoV-2 by FDA under an Emergency Use Authorization (EUA).  This EUA will remain in effect (meaning this test can be used) for the duration of the COVID-19 declaration under Section 564(b)(1) of the Act, 21 U.S.C. section 360bbb-3(b)(1), unless the authorization is terminated or revoked sooner.  Performed at [facility], [address], [place], [place] [address]   SARS Coronavirus 2 by RT PCR (hospital order, performed in [facility] hospital lab) Nasopharyngeal Nasopharyngeal Swab     Status: None   Collection Time: 06/03/20 10:06 AM   Specimen: Nasopharyngeal Swab  Result Value Ref Range Status   SARS Coronavirus 2 NEGATIVE NEGATIVE Final    Comment: (NOTE) SARS-CoV-2 target nucleic acids are NOT DETECTED.  The SARS-CoV-2 RNA is generally detectable in upper and lower respiratory specimens during the acute phase of infection. The lowest concentration of SARS-CoV-2 viral copies this assay can detect is 250 copies / mL. A negative result does not preclude SARS-CoV-2 infection and should not be used as the sole basis for treatment or other patient management decisions.  A negative result may occur with improper specimen collection / handling, submission of specimen other than nasopharyngeal swab, presence of viral mutation(s) within the areas targeted by this assay, and inadequate number of viral copies (<250 copies / mL). A negative result must be combined with clinical observations, patient history, and epidemiological information.  Fact Sheet for Patients:   BoilerBrush.com.cy  Fact Sheet for Healthcare Providers: https://pope.com/  This test is not yet  approved or  cleared by the [place] FDA and has been authorized for detection and/or diagnosis of SARS-CoV-2 by FDA under an Emergency Use Authorization (EUA).  This EUA will remain in effect (meaning this test can be used) for the duration of the COVID-19 declaration under Section 564(b)(1) of the Act, 21 U.S.C. section 360bbb-3(b)(1), unless the authorization is terminated or revoked sooner.  Performed at [facility], [address] [place], [place] [address]          Radiology Studies: No results found.      Scheduled Meds: . enoxaparin (LOVENOX) injection  40 mg Subcutaneous Q24H  . pantoprazole  40 mg  Oral Daily  . sodium chloride flush  3 mL Intravenous Q12H   Continuous Infusions: . acetylcysteine 15 mg/kg/hr (06/02/20 0218)  . dextrose 5 % and 0.9% NaCl    . potassium chloride       LOS: 2 days    Time spent: 25 mins.More than 50% of that time was spent in counseling and/or coordination of care.      Burnadette PopAmrit Jewelz Ricklefs, MD [facility] P8/18/2021, 8:34 AM
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
## 2020-06-04 NOTE — Progress Notes (Signed)
Pt refused Lovenox, said it made her sick. MD notified.

## 2020-06-04 NOTE — Progress Notes (Signed)
Pharmacy: Re- Acetadote  Patient's a 29 y.o F with hx polysubstance abuse presented to the ED on 8/15 with  intentional acetaminophen overdose.  She was started on Acetadote on 8/16 at 0150.  - 8/15: APAP level >500; LFTs wnl - 8/16: APAP level 336; AST/ALT 66/65, Tbili wnl; INR 1.7            Started on Acetadote drip            Fomepizole 15 mg/kg x1 - 8/17: APAP level <10; AST/ALT 3046/2711, Tbili wnl; INR 2.1 - 8/18: at 0500 AST/ALT 1585/2018, Tbili 0.5; INR 1.9            at 1828 AST/ALT 629/1540, Tbili up 1.4; INR 1.5  Plan: - Spoke to [employer] [facility] [employer] [facility], she recommends to d/c Acetadote since AST/ALT are trending down and INR <2 - will d/c Acetadote - Informed M. Katherina Right of plan  Dorna Leitz, PharmD, BCPS 06/04/2020 7:25 PM

## 2020-06-05 ENCOUNTER — Other Ambulatory Visit: Payer: Self-pay

## 2020-06-05 DIAGNOSIS — T391X2D Poisoning by 4-Aminophenol derivatives, intentional self-harm, subsequent encounter: Secondary | ICD-10-CM

## 2020-06-05 DIAGNOSIS — T1491XA Suicide attempt, initial encounter: Secondary | ICD-10-CM

## 2020-06-05 LAB — COMPREHENSIVE METABOLIC PANEL
ALT: 1168 U/L — ABNORMAL HIGH (ref 0–44)
AST: 268 U/L — ABNORMAL HIGH (ref 15–41)
Albumin: 3.1 g/dL — ABNORMAL LOW (ref 3.5–5.0)
Alkaline Phosphatase: 51 U/L (ref 38–126)
Anion gap: 4 — ABNORMAL LOW (ref 5–15)
BUN: <5 mg/dL — ABNORMAL LOW (ref 6–20)
CO2: 23 mmol/L (ref 22–32)
Calcium: 7.9 mg/dL — ABNORMAL LOW (ref 8.9–10.3)
Chloride: 112 mmol/L — ABNORMAL HIGH (ref 98–111)
Creatinine, Ser: 0.44 mg/dL (ref 0.44–1.00)
GFR calc Af Amer: >60 mL/min (ref >60)
GFR calc non Af Amer: >60 mL/min (ref >60)
Glucose, Bld: 119 mg/dL — ABNORMAL HIGH (ref 70–99)
Potassium: 3.4 mmol/L — ABNORMAL LOW (ref 3.5–5.1)
Sodium: 139 mmol/L (ref 135–145)
Total Bilirubin: 1.2 mg/dL (ref 0.3–1.2)
Total Protein: 5.7 g/dL — ABNORMAL LOW (ref 6.5–8.1)

## 2020-06-05 LAB — PROTIME-INR
INR: 1.2 (ref 0.8–1.2)
Prothrombin Time: 14.5 seconds (ref 11.4–15.2)

## 2020-06-05 MED ORDER — PSYLLIUM 95 % PO PACK
1.0000 | PACK | Freq: Every day | ORAL | Status: DC
  Administered 2020-06-05 – 2020-06-10 (×7): 1 via ORAL
  Filled 2020-06-05 (×6): qty 1

## 2020-06-05 MED ORDER — SACCHAROMYCES BOULARDII 250 MG PO CAPS
250.0000 mg | ORAL_CAPSULE | Freq: Two times a day (BID) | ORAL | Status: DC
  Administered 2020-06-05 – 2020-06-10 (×10): 250 mg via ORAL
  Filled 2020-06-05 (×12): qty 1

## 2020-06-05 NOTE — Progress Notes (Signed)
[facility] Progress Note  CC:   Acetaminophen overdose  Subjective: She is smiling today. No N/V or abdominal pain. She reports having chronic mucous from the rectum, no blood mucous. No rectal bleeding. Occasional constipation. No diarrhea. She thought she should mention her bowel pattern while she was in the hospital. Sitter remains at the bedside.    Objective:  Vital signs in last 24 hours: Temp:  [98.3 F (36.8 C)-99.4 F (37.4 C)] 98.3 F (36.8 C) (08/19 0650) Pulse Rate:  [59-74] 59 (08/19 0650) Resp:  [16-18] 16 (08/19 0650) BP: (107-117)/(60-72) 114/72 (08/19 0650) SpO2:  [100 %] 100 % (08/19 0650) Last BM Date: 06/03/20   General:   Alert 29 year old female in NAD.  Heart: RRR, no murmur.   Pulm:  Breath sounds clear throughout.  Abdomen: Soft, nontender. + BS x 4 quads. No HSM. Extremities:  Without edema. Neurologic:  Alert and  oriented x4;  grossly normal neurologically. Psych:  Alert and cooperative. Normal mood and affect.  Intake/Output from previous day: 08/18 0701 - 08/19 0700 In: 2479.5 [P.O.:660; I.V.:1819.5] Out: 9 [Urine:8; Stool:1] Intake/Output this shift: Total I/O In: 3 [I.V.:3] Out: -   Lab Results: Recent Labs    06/03/20 0513 06/04/20 0500  WBC 7.7 5.9  HGB 10.4* 10.6*  HCT 30.3* 31.6*  PLT 171 109*   BMET Recent Labs    06/04/20 0500 06/04/20 1828 06/05/20 0509  NA 140 139 139  K 3.1* 3.7 3.4*  CL 108 111 112*  CO2 24 21* 23  GLUCOSE 87 92 119*  BUN 6 6 <5*  CREATININE 0.43* 0.48 0.44  CALCIUM 8.1* 8.1* 7.9*   LFT Recent Labs    06/04/20 1828 06/04/20 1828 06/05/20 0509  PROT 6.3*   < > 5.7*  ALBUMIN 3.5   < > 3.1*  AST 629*   < > 268*  ALT 1,540*   < > 1,168*  ALKPHOS 62   < > 51  BILITOT 1.4*   < > 1.2  BILIDIR 0.3*  --   --   IBILI 1.1*  --   --    < > = values in this interval not displayed.   PT/INR Recent Labs    06/04/20 1828 06/05/20 1208  LABPROT 17.8* 14.5  INR 1.5* 1.2    Hepatitis Panel No results for input(s): HEPBSAG, HCVAB, HEPAIGM, HEPBIGM in the last 72 hours.  US Abdomen Limited RUQ  Result Date: 06/04/2020 CLINICAL DATA:  29 year old female with elevated LFTs. EXAM: ULTRASOUND ABDOMEN LIMITED RIGHT UPPER QUADRANT COMPARISON:  None. FINDINGS: Gallbladder: There are 2 stones within the gallbladder. No gallbladder wall thickening or pericholecystic fluid. Negative sonographic Murphy's sign. Common bile duct: Diameter: 2 mm Liver: There is diffuse increased liver echogenicity most commonly seen in the setting of fatty infiltration. Superimposed inflammation or fibrosis is not excluded. Clinical correlation is recommended. Portal vein is patent on color Doppler imaging with normal direction of blood flow towards the liver. Other: None. IMPRESSION: 1. Cholelithiasis without sonographic evidence of acute cholecystitis. 2. Fatty liver. Electronically Signed   By: Anner Crete M.D.   On: 06/04/2020 17:08    Assessment / Plan:  50.  29 year old female with a history of polysubstance abuse admitted to the hospital with intentional Acetaminophen overdose suicidal intent. Acute hepatic injury.   APAP level > 500 on admission.  She NAC was initiated.  Admission LFTs: AST 28.  ALT 17.  Alk phos 64.  Total bili  0.7.  Acute hepatitis panel negative. LFTs spiked on 8/17 AST 99 ->3046. ALT 114->2711. T.  Bili 1.0. Alk phos 62. Her LFTs are drifting downward. AST 268. ALT 1168. INR 1.2. RUQ sono 8/18 showed cholelithiasis without acute cholecystitis and a fatty liver.  NAC was discontinued today by pharmacy as recommended by the [facility]. -Continue to monitor hepatic panel daily -Agree admission to inpatient psych unit when medically stable  2. Coagulopathy secondary to # 1, resolved. INR 1.2.    3. Altered bowel pattern, mucous per the rectum.  -Miralax Q HS PRN for constipation  -Follow up as outpatient   Principal Problem:   Intentional acetaminophen  overdose ([place]) Active Problems:   Polysubstance abuse (HCC)   Hypokalemia   Agitation   Suicidal behavior   Elevated LFTs   Coagulopathy (HCC)   Mucus in stool     LOS: 3 days   Noralyn Pick  06/05/2020, 1:04 PM

## 2020-06-05 NOTE — Progress Notes (Signed)
PROGRESS NOTE  Tiffany Wong MOQ:[id number] DOB: April 12, 1991 DOA: 06/01/2020 PCP: Medicine, [facility]  Brief History   Patient is a 29 year old female with history of depression/polysubstance abuse/bipolar disorder , Suicidal attempts in the past who presented to the emergency department with complaints of intentional Tylenol overdose.  She reported ingesting 3 bottles of Tylenol.  On her presentation, she was somnolent, later agitated.  Poison control was contacted.  She was also hypotensive on presentation.  Tylenol level was more than 500.  UDS was positive for MDMA, cocaine, THC.  She was started on IV fluids, NAC protocol.  Given a dose of fomepizole.  She endorses that she took the pills to kill herself because she felt depressed.  Patient has been IVCed.  Plan for admission to inpatient psych after medical stabilization.  Also started on bicarb drip for severe acidosis.  Her liver enzymes elevated significantly on 06/03/2020 evening.  GI consulted on 06/04/2020.   She is found to have acute hepatic injury, however coagulopathy and transminitis are improving. NAC has been continued until liver enzymes are clearly trending downward. She will require transfer to a liver transplant center should she fail to show steady improvement with NAC and/or development of encephalopathy. Stool studies are pending for evaluation of the patient's concerns for possible parasites and mucous in the stool. She is constipated.   Consultants  . Gastroenterology  Procedures  . None  Antibiotics   Anti-infectives (From admission, onward)   None    .  Subjective  The patient is sitting up in bed. No new complaints. She states that she believe that she could give [place] a stool today.  Objective   Vitals:  Vitals:   06/04/20 2218 06/05/20 0650  BP: 107/72 114/72  Pulse: 74 (!) 59  Resp: 17 16  Temp: 99.4 F (37.4 C) 98.3 F (36.8 C)  SpO2: 100% 100%   Exam:  Constitutional:  . The  patient is awake, alert, and oriented x 3. No acute distress. Respiratory:  . No increased work of breathing. . No wheezes, rales, or rhonchi . No tactile fremitus Cardiovascular:  . Regular rate and rhythm . No murmurs, ectopy, or gallups. . No lateral PMI. No thrills. Abdomen:  . Abdomen is soft, non-tender, non-distended . No hernias, masses, or organomegaly . Normoactive bowel sounds.  Musculoskeletal:  . No cyanosis, clubbing, or edema Skin:  . No rashes, lesions, ulcers . palpation of skin: no induration or nodules Neurologic:  . CN 2-12 intact . Sensation all 4 extremities intact Psychiatric:  . Mental status o Mood, affect appropriate o Orientation to person, place, time  . judgment and insight appear intact  I have personally reviewed the following:   Today's Data  . Vitals, CMP, PT/INR/Toxicology  Imaging  . RUQ [place]: Cholelithiasis without sonographic evidence of acute cholecystits and fatty liver.  Scheduled Meds: . enoxaparin (LOVENOX) injection  40 mg Subcutaneous Q24H  . pantoprazole  40 mg Oral Daily  . sodium chloride flush  3 mL Intravenous Q12H   Continuous Infusions: . dextrose 5 % and 0.9% NaCl 75 mL/hr at 06/05/20 0038    Principal Problem:   Intentional acetaminophen overdose (HCC) Active Problems:   Polysubstance abuse (HCC)   Hypokalemia   Agitation   Suicidal behavior   Elevated LFTs   Coagulopathy (HCC)   Mucus in stool   LOS: 3 days   A & P   Tylenol overdose: Ingested about 70 g of Tylenol. Tylenol level is more  than 500 on admission.  LFTs trended up ,now significantly elevated.  INR of 1.7 on presentation,also trended up.  Started on NAC protocol, given a dose of fomepizole.  Poison control was following.  Liver enzymes appear to be trending down, and acidosis is resolving.  I appreciate GI's assistance. Right upper quadrant ultrasound was performed and has demonstrated Cholelithiasis without evidence for acute cholecystitis.  Fatty liver seen. She is currently tolerating a regular diet. She remains on NAC protocol.   Metabolic acidosis: Resolved on bicarb drip.  Will stop bicarbonate.  Suicidal attempt: Patient ingested Tylenol with suicidal attempt.  She has history of suicidal attempts in the past.  She has history of depression/bipolar disorder.  Psychiatry consulted.  Recommended inpatient psychiatric admission when medically stable.  Patient has been IVCed.  Polysubstance abuse: UDS positive for amphetamines, cocaine, THC.  Counseled for cessation.  Hypokalemia: Being supplemented with potassium.  Abdomen pain/nausea: Resolved. Continue Protonix, antiemetics.  This is most likely from gastric irritation from Tylenol overdose.  Her abdominal pain has resolved.  Accidental exposure to Covid patient: While in the emergency department, another patient who was in the same room with her ,was diagnosed with Covid.  Repeat Covid test done on 06/03/20 is negative.  She is asymptomatic. She will need to continue to quarantine for 14 days after exposure upon discharge. This may complicate inpatient psychiatric discharge.  I have seen and examined this patient myself. I have spent 35 minutes in her evaluation and care.  DVT Prophylaxis: Lovenox CODE STATUS: Full Code Family Communication: None available Disposition:  Status is: Inpatient  Remains inpatient appropriate because:IV treatments appropriate due to intensity of illness or inability to take PO   Dispo: The patient is from: Home              Anticipated d/c is to: Inpatient psychiatric bed.              Anticipated d/c date is: 2 days              Patient currently is not medically stable to d/c.  Kearie Mennen, DO [facility] Direct contact: see [email or web address]  7PM-7AM contact night coverage as above 06/05/2020, 12:51 PM  LOS: 3 days

## 2020-06-06 LAB — CBC WITH DIFFERENTIAL/PLATELET
Abs Immature Granulocytes: 0.01 10*3/uL (ref 0.00–0.07)
Basophils Absolute: 0 10*3/uL (ref 0.0–0.1)
Basophils Relative: 1 %
Eosinophils Absolute: 0.1 10*3/uL (ref 0.0–0.5)
Eosinophils Relative: 3 %
HCT: 31.6 % — ABNORMAL LOW (ref 36.0–46.0)
Hemoglobin: 10.3 g/dL — ABNORMAL LOW (ref 12.0–15.0)
Immature Granulocytes: 0 %
Lymphocytes Relative: 35 %
Lymphs Abs: 1.4 10*3/uL (ref 0.7–4.0)
MCH: 29.3 pg (ref 26.0–34.0)
MCHC: 32.6 g/dL (ref 30.0–36.0)
MCV: 90 fL (ref 80.0–100.0)
Monocytes Absolute: 0.4 10*3/uL (ref 0.1–1.0)
Monocytes Relative: 10 %
Neutro Abs: 2.2 10*3/uL (ref 1.7–7.7)
Neutrophils Relative %: 51 %
Platelets: 107 10*3/uL — ABNORMAL LOW (ref 150–400)
RBC: 3.51 MIL/uL — ABNORMAL LOW (ref 3.87–5.11)
RDW: 12.7 % (ref 11.5–15.5)
WBC: 4.2 10*3/uL (ref 4.0–10.5)
nRBC: 0 % (ref 0.0–0.2)

## 2020-06-06 LAB — COMPREHENSIVE METABOLIC PANEL
ALT: 864 U/L — ABNORMAL HIGH (ref 0–44)
AST: 92 U/L — ABNORMAL HIGH (ref 15–41)
Albumin: 3.5 g/dL (ref 3.5–5.0)
Alkaline Phosphatase: 55 U/L (ref 38–126)
Anion gap: 10 (ref 5–15)
BUN: <5 mg/dL — ABNORMAL LOW (ref 6–20)
CO2: 21 mmol/L — ABNORMAL LOW (ref 22–32)
Calcium: 8.4 mg/dL — ABNORMAL LOW (ref 8.9–10.3)
Chloride: 110 mmol/L (ref 98–111)
Creatinine, Ser: 0.51 mg/dL (ref 0.44–1.00)
GFR calc Af Amer: >60 mL/min (ref >60)
GFR calc non Af Amer: >60 mL/min (ref >60)
Glucose, Bld: 102 mg/dL — ABNORMAL HIGH (ref 70–99)
Potassium: 3.5 mmol/L (ref 3.5–5.1)
Sodium: 141 mmol/L (ref 135–145)
Total Bilirubin: 0.9 mg/dL (ref 0.3–1.2)
Total Protein: 6.4 g/dL — ABNORMAL LOW (ref 6.5–8.1)

## 2020-06-06 LAB — PROTIME-INR
INR: 1.1 (ref 0.8–1.2)
Prothrombin Time: 14.1 seconds (ref 11.4–15.2)

## 2020-06-06 NOTE — Progress Notes (Signed)
PROGRESS NOTE  Tiffany Wong WUJ:[id number] DOB: [birth year] DOA: 06/01/2020 PCP: Medicine, [facility]  Brief History   Patient is a 29 year old female with history of depression/polysubstance abuse/bipolar disorder , Suicidal attempts in the past who presented to the emergency department with complaints of intentional Tylenol overdose.  She reported ingesting 3 bottles of Tylenol.  On her presentation, she was somnolent, later agitated.  Poison control was contacted.  She was also hypotensive on presentation.  Tylenol level was more than 500.  UDS was positive for MDMA, cocaine, THC.  She was started on IV fluids, NAC protocol.  Given a dose of fomepizole.  She endorses that she took the pills to kill herself because she felt depressed.  Patient has been IVCed.  Plan for admission to inpatient psych after medical stabilization.  Also started on bicarb drip for severe acidosis.  Her liver enzymes elevated significantly on 06/03/2020 evening.  GI consulted on 06/04/2020.   She is found to have acute hepatic injury, however coagulopathy and transminitis are improving. NAC has been continued until liver enzymes are clearly trending downward. She will require transfer to a liver transplant center should she fail to show steady improvement with NAC and/or development of encephalopathy. Stool studies are pending for evaluation of the patient's concerns for possible parasites and mucous in the stool. Constipation was relieved with 4 BM's yesterday. NAC protocol has been completed.  Consultants   Gastroenterology  Psychiatry  Procedures   None  Antibiotics   Anti-infectives (From admission, onward)   None     Subjective  The patient is in bed, talking on the phone. She just smiles at me when I ask how she is doing.  Objective   Vitals:  Vitals:   06/06/20 0658 06/06/20 1029  BP: 121/75 113/69  Pulse: (!) 52 (!) 54  Resp: 18   Temp: 98.2 F (36.8 C) 98.8 F (37.1 C)  SpO2:  90% 100%   Exam:  Constitutional:   The patient is awake, alert, and oriented x 3. No acute distress. Respiratory:   No increased work of breathing.  No wheezes, rales, or rhonchi  No tactile fremitus Cardiovascular:   Regular rate and rhythm  No murmurs, ectopy, or gallups.  No lateral PMI. No thrills. Abdomen:   Abdomen is soft, non-tender, non-distended  No hernias, masses, or organomegaly  Normoactive bowel sounds.  Musculoskeletal:   No cyanosis, clubbing, or edema Skin:   No rashes, lesions, ulcers  palpation of skin: no induration or nodules Neurologic:   CN 2-12 intact  Sensation all 4 extremities intact Psychiatric:   Mental status o Mood, affect appropriate o Orientation to person, place, time   judgment and insight appear intact  I have personally reviewed the following:   Today's Data   Vitals, CMP, PT/INR  Imaging   RUQ [place]: Cholelithiasis without sonographic evidence of acute cholecystits and fatty liver.  Scheduled Meds:  enoxaparin (LOVENOX) injection  40 mg Subcutaneous Q24H   pantoprazole  40 mg Oral Daily   psyllium  1 packet Oral Daily   saccharomyces boulardii  250 mg Oral BID   sodium chloride flush  3 mL Intravenous Q12H   Continuous Infusions:  dextrose 5 % and 0.9% NaCl 75 mL/hr at 06/06/20 9562    Principal Problem:   Intentional acetaminophen overdose (HCC) Active Problems:   Polysubstance abuse (HCC)   Hypokalemia   Agitation   Suicidal behavior   Elevated LFTs   Coagulopathy (HCC)   Mucus in  stool   LOS: 4 days   A & P   Tylenol overdose: Ingested about 70 g of Tylenol. Tylenol level is more than 500 on admission.  LFTs trended up ,now significantly elevated.  INR of 1.7 on presentation,also trended up.  Started on NAC protocol, given a dose of fomepizole.  Poison control was following.  Liver enzymes appear to be trending down, and acidosis is resolving.  I appreciate GI's assistance. Right upper  quadrant ultrasound was performed and has demonstrated Cholelithiasis without evidence for acute cholecystitis. Fatty liver seen. She is currently tolerating a regular diet. NAC protocol has been completed. GI has signed off. The patient is medically cleared for discharge to psychiatric facility.  Metabolic acidosis: Resolved on bicarb drip.  Will stop bicarbonate.  Suicidal attempt: Patient ingested Tylenol with suicidal attempt.  She has history of suicidal attempts in the past.  She has history of depression/bipolar disorder.  Psychiatry consulted.  Recommended inpatient psychiatric admission when medically stable.  Patient has been IVCed. She is medically cleared for discharge to psychiatric facility.  Polysubstance abuse: UDS positive for amphetamines, cocaine, THC. Counseled for cessation.  Hypokalemia: Resolved. Being supplemented with potassium.  Abdomen pain/nausea: Resolved. Continue Protonix, antiemetics.  This is most likely from gastric irritation from Tylenol overdose.  Her abdominal pain has resolved.  Accidental exposure to Covid patient: While in the emergency department, another patient who was in the same room with her ,was diagnosed with Covid.  Repeat Covid test done on 06/03/20 is negative.  She is asymptomatic. She will need to continue to quarantine for 14 days after exposure upon discharge. This may complicate inpatient psychiatric discharge.  I have seen and examined this patient myself. I have spent 35 minutes in her evaluation and care.  DVT Prophylaxis: Lovenox CODE STATUS: Full Code Family Communication: None available Disposition:  Status is: Inpatient  Remains inpatient appropriate because: Lack of safe discharge. Pt is medically cleared to discharge to inpatient psychiatric facility. Will need to complete 14 day quarantine at facility due to inadvertent exposure in ED. 10 more days required   Dispo: The patient is from: Home              Anticipated  d/c is to: Inpatient psychiatric bed.              Anticipated d/c date is: 2 days              Patient currently is medically cleared for discharge.  Simcha Speir, DO [facility] Direct contact: see [email or web address]  7PM-7AM contact night coverage as above 06/06/2020, 1:53 PM  LOS: 3 days

## 2020-06-07 LAB — COMPREHENSIVE METABOLIC PANEL
ALT: 569 U/L — ABNORMAL HIGH (ref 0–44)
AST: 43 U/L — ABNORMAL HIGH (ref 15–41)
Albumin: 3.3 g/dL — ABNORMAL LOW (ref 3.5–5.0)
Alkaline Phosphatase: 50 U/L (ref 38–126)
Anion gap: 7 (ref 5–15)
BUN: <5 mg/dL — ABNORMAL LOW (ref 6–20)
CO2: 24 mmol/L (ref 22–32)
Calcium: 8.7 mg/dL — ABNORMAL LOW (ref 8.9–10.3)
Chloride: 112 mmol/L — ABNORMAL HIGH (ref 98–111)
Creatinine, Ser: 0.53 mg/dL (ref 0.44–1.00)
GFR calc Af Amer: >60 mL/min (ref >60)
GFR calc non Af Amer: >60 mL/min (ref >60)
Glucose, Bld: 92 mg/dL (ref 70–99)
Potassium: 4.1 mmol/L (ref 3.5–5.1)
Sodium: 143 mmol/L (ref 135–145)
Total Bilirubin: 0.8 mg/dL (ref 0.3–1.2)
Total Protein: 6 g/dL — ABNORMAL LOW (ref 6.5–8.1)

## 2020-06-07 MED ORDER — LORAZEPAM 1 MG PO TABS
1.0000 mg | ORAL_TABLET | Freq: Four times a day (QID) | ORAL | Status: DC | PRN
  Administered 2020-06-07 – 2020-06-10 (×6): 1 mg via ORAL
  Filled 2020-06-07 (×6): qty 1

## 2020-06-07 MED ORDER — POLYETHYLENE GLYCOL 3350 17 G PO PACK
17.0000 g | PACK | Freq: Every day | ORAL | Status: DC
  Administered 2020-06-07 – 2020-06-10 (×4): 17 g via ORAL
  Filled 2020-06-07 (×3): qty 1

## 2020-06-07 MED ORDER — VALACYCLOVIR HCL 500 MG PO TABS
1000.0000 mg | ORAL_TABLET | Freq: Two times a day (BID) | ORAL | Status: DC
  Administered 2020-06-07 – 2020-06-10 (×6): 1000 mg via ORAL
  Filled 2020-06-07 (×6): qty 2

## 2020-06-07 NOTE — Progress Notes (Signed)
PROGRESS NOTE  Tiffany Wong OQH:[id number] DOB: 04-09-1991 DOA: 06/01/2020 PCP: Medicine, [facility]  Brief History   Patient is a 29 year old female with history of depression/polysubstance abuse/bipolar disorder , Suicidal attempts in the past who presented to the emergency department with complaints of intentional Tylenol overdose.  She reported ingesting 3 bottles of Tylenol.  On her presentation, she was somnolent, later agitated.  Poison control was contacted.  She was also hypotensive on presentation.  Tylenol level was more than 500.  UDS was positive for MDMA, cocaine, THC.  She was started on IV fluids, NAC protocol.  Given a dose of fomepizole.  She endorses that she took the pills to kill herself because she felt depressed.  Patient has been IVCed.  Plan for admission to inpatient psych after medical stabilization.  Also started on bicarb drip for severe acidosis.  Her liver enzymes elevated significantly on 06/03/2020 evening.  GI consulted on 06/04/2020.   She is found to have acute hepatic injury, however coagulopathy and transminitis are improving. NAC has been continued until liver enzymes are clearly trending downward. She will require transfer to a liver transplant center should she fail to show steady improvement with NAC and/or development of encephalopathy. Stool studies are pending for evaluation of the patient's concerns for possible parasites and mucous in the stool. Constipation was relieved with 4 BM's yesterday. NAC protocol has been completed.  The patient is medically cleared for discharge. She is pending placement at an inpatient psychiatric facility. However, given that she was exposed to COVID-19 in the ED, she will need to quarantine for 14 days. Today is day 7. I do not know how this affect her placement.  Consultants  . Gastroenterology . Psychiatry  Procedures  . None  Antibiotics   Anti-infectives (From admission, onward)   None      Subjective  The patient is in bed. No new complaints.  Objective   Vitals:  Vitals:   06/06/20 2125 06/07/20 0620  BP: 117/75 122/83  Pulse: (!) 50 60  Resp: 17 17  Temp: 99.1 F (37.3 C) 98.9 F (37.2 C)  SpO2: 100% 100%   Exam:  Constitutional:  . The patient is awake, alert, and oriented x 3. No acute distress. Respiratory:  . No increased work of breathing. . No wheezes, rales, or rhonchi . No tactile fremitus Cardiovascular:  . Regular rate and rhythm . No murmurs, ectopy, or gallups. . No lateral PMI. No thrills. Abdomen:  . Abdomen is soft, non-tender, non-distended . No hernias, masses, or organomegaly . Normoactive bowel sounds.  Musculoskeletal:  . No cyanosis, clubbing, or edema Skin:  . No rashes, lesions, ulcers . palpation of skin: no induration or nodules Neurologic:  . CN 2-12 intact . Sensation all 4 extremities intact Psychiatric:  . Mental status o Mood, affect appropriate o Orientation to person, place, time  . judgment and insight appear intact  I have personally reviewed the following:   Today's Data  . Vitals, CMP  Imaging  . RUQ [place]: Cholelithiasis without sonographic evidence of acute cholecystits and fatty liver.  Scheduled Meds: . enoxaparin (LOVENOX) injection  40 mg Subcutaneous Q24H  . pantoprazole  40 mg Oral Daily  . psyllium  1 packet Oral Daily  . saccharomyces boulardii  250 mg Oral BID  . sodium chloride flush  3 mL Intravenous Q12H   Continuous Infusions: . dextrose 5 % and 0.9% NaCl 75 mL/hr at 06/07/20 0520    Principal Problem:  Intentional acetaminophen overdose ([facility]) Active Problems:   Polysubstance abuse (HCC)   Hypokalemia   Agitation   Suicidal behavior   Elevated LFTs   Coagulopathy (HCC)   Mucus in stool   LOS: 5 days   A & P   Tylenol overdose: Ingested about 70 g of Tylenol. Tylenol level is more than 500 on admission.  LFTs trended up ,now significantly elevated.  INR of 1.7 on  presentation,also trended up.  Started on NAC protocol, given a dose of fomepizole.  Poison control was following.  Liver enzymes appear to be trending down, and acidosis is resolving.  I appreciate GI's assistance. Right upper quadrant ultrasound was performed and has demonstrated Cholelithiasis without evidence for acute cholecystitis. Fatty liver seen. She is currently tolerating a regular diet. NAC protocol has been completed. GI has signed off. The patient is medically cleared for discharge to psychiatric facility.  Metabolic acidosis: Resolved on bicarb drip.  Will stop bicarbonate.  Suicidal attempt: Patient ingested Tylenol with suicidal attempt.  She has history of suicidal attempts in the past.  She has history of depression/bipolar disorder.  Psychiatry consulted.  Recommended inpatient psychiatric admission when medically stable.  Patient has been IVCed. She is medically cleared for discharge to psychiatric facility.  Polysubstance abuse: UDS positive for amphetamines, cocaine, THC. Counseled for cessation.  Hypokalemia: Resolved. Being supplemented with potassium.  Abdomen pain/nausea: Resolved. Continue Protonix, antiemetics.  This is most likely from gastric irritation from Tylenol overdose.  Her abdominal pain has resolved.  Accidental exposure to Covid patient: While in the emergency department, another patient who was in the same room with her ,was diagnosed with Covid.  Repeat Covid test done on 06/03/20 is negative.  She is asymptomatic. She will need to continue to quarantine for 14 days after exposure upon discharge. This may complicate inpatient psychiatric discharge.  I have seen and examined this patient myself. I have spent 35 minutes in her evaluation and care.  DVT Prophylaxis: Lovenox CODE STATUS: Full Code Family Communication: None available Disposition:  Status is: Inpatient  Remains inpatient appropriate because: Lack of safe discharge. Pt is medically  cleared to discharge to inpatient psychiatric facility. Will need to complete 14 day quarantine at facility due to inadvertent exposure in ED. 7 more days required.   Dispo: The patient is from: Home              Anticipated d/c is to: Inpatient psychiatric bed.              Anticipated d/c date is: 2 days              Patient currently is medically cleared for discharge.  Benjerman Molinelli, DO [facility] Direct contact: see [email or web address]  7PM-7AM contact night coverage as above 06/07/2020, 1:21 PM  LOS: 3 days

## 2020-06-07 NOTE — Progress Notes (Signed)
Spoke with patient about situation and tried to find ways to calm herself down about the upcoming days. Pt spoke of feeling abandoned by [facility] facilities that she was unable to be seen for at least 1 month for second appt. Also MD she saw spent most time awing over her daughter during their appt. She also explained that trouble being able to get her prescriptions refilled in a timely manner led to her going through withdrawal and it was uneventful. She has withdrawn off her meds several times but never completely felt better until this time. This is why she did not go get her refills. Didn't want to keep having to go through with  drawals and not getting therapy. She wants help but feels helpless and not convinced any med will work for her in her situation. She reports that she spoke to MD days ago here that she had a herpes outbreak and requested medication. She still hasn't received this medicine and doesn't want to keep asking for it. She is trying but will need to be tired and able to sleep most of the day to continue staying here. Wanting therapy now.  MD notified of change in behavior.

## 2020-06-08 DIAGNOSIS — F3132 Bipolar disorder, current episode depressed, moderate: Secondary | ICD-10-CM

## 2020-06-08 LAB — COMPREHENSIVE METABOLIC PANEL
ALT: 444 U/L — ABNORMAL HIGH (ref 0–44)
AST: 27 U/L (ref 15–41)
Albumin: 3.5 g/dL (ref 3.5–5.0)
Alkaline Phosphatase: 52 U/L (ref 38–126)
Anion gap: 8 (ref 5–15)
BUN: <5 mg/dL — ABNORMAL LOW (ref 6–20)
CO2: 23 mmol/L (ref 22–32)
Calcium: 8.6 mg/dL — ABNORMAL LOW (ref 8.9–10.3)
Chloride: 110 mmol/L (ref 98–111)
Creatinine, Ser: 0.55 mg/dL (ref 0.44–1.00)
GFR calc Af Amer: >60 mL/min (ref >60)
GFR calc non Af Amer: >60 mL/min (ref >60)
Glucose, Bld: 93 mg/dL (ref 70–99)
Potassium: 3.4 mmol/L — ABNORMAL LOW (ref 3.5–5.1)
Sodium: 141 mmol/L (ref 135–145)
Total Bilirubin: 0.8 mg/dL (ref 0.3–1.2)
Total Protein: 6.4 g/dL — ABNORMAL LOW (ref 6.5–8.1)

## 2020-06-08 NOTE — Progress Notes (Signed)
PROGRESS NOTE  Tiffany Wong WUX:[id number] DOB: 01-Apr-1991 DOA: 06/01/2020 PCP: Medicine, [facility]  Brief History   Patient is a 29 year old female with history of depression/polysubstance abuse/bipolar disorder , Suicidal attempts in the past who presented to the emergency department with complaints of intentional Tylenol overdose.  She reported ingesting 3 bottles of Tylenol.  On her presentation, she was somnolent, later agitated.  Poison control was contacted.  She was also hypotensive on presentation.  Tylenol level was more than 500.  UDS was positive for MDMA, cocaine, THC.  She was started on IV fluids, NAC protocol.  Given a dose of fomepizole.  She endorses that she took the pills to kill herself because she felt depressed.  Patient has been IVCed.  Plan for admission to inpatient psych after medical stabilization.  Also started on bicarb drip for severe acidosis.  Her liver enzymes elevated significantly on 06/03/2020 evening.  GI consulted on 06/04/2020.   She is found to have acute hepatic injury, however coagulopathy and transminitis are improving. NAC has been continued until liver enzymes are clearly trending downward. She will require transfer to a liver transplant center should she fail to show steady improvement with NAC and/or development of encephalopathy. Stool studies are pending for evaluation of the patient's concerns for possible parasites and mucous in the stool. Constipation was relieved with 4 BM's yesterday. NAC protocol has been completed.  The patient is medically cleared for discharge. She is pending placement at an inpatient psychiatric facility. However, given that she was exposed to COVID-19 in the ED, she will need to quarantine for 14 days. Today is day 8. I do not know how this affect her placement.  The patient is having mood swings.  Consultants  . Gastroenterology . Psychiatry  Procedures  . None  Antibiotics   Anti-infectives (From  admission, onward)   Start     Dose/Rate Route Frequency Ordered Stop   06/07/20 1800  valACYclovir (VALTREX) tablet 1,000 mg        1,000 mg Oral 2 times daily 06/07/20 1535       Subjective  The patient is in bed. No new complaints.  Objective   Vitals:  Vitals:   06/07/20 2038 06/08/20 0701  BP: 139/86 125/73  Pulse: 66 (!) 57  Resp: 18 18  Temp: 98.3 F (36.8 C) 98.4 F (36.9 C)  SpO2: 100% 100%   Exam:  Constitutional:  . The patient is awake, alert, and oriented x 3. No acute distress. Respiratory:  . No increased work of breathing. . No wheezes, rales, or rhonchi . No tactile fremitus Cardiovascular:  . Regular rate and rhythm . No murmurs, ectopy, or gallups. . No lateral PMI. No thrills. Abdomen:  . Abdomen is soft, non-tender, non-distended . No hernias, masses, or organomegaly . Normoactive bowel sounds.  Musculoskeletal:  . No cyanosis, clubbing, or edema Skin:  . No rashes, lesions, ulcers . palpation of skin: no induration or nodules Neurologic:  . CN 2-12 intact . Sensation all 4 extremities intact Psychiatric:  . Mental status o Mood, affect appropriate o Orientation to person, place, time  . judgment and insight appear intact  I have personally reviewed the following:   Today's Data  . Vitals, CMP  Imaging  . RUQ [place]: Cholelithiasis without sonographic evidence of acute cholecystits and fatty liver.  Scheduled Meds: . enoxaparin (LOVENOX) injection  40 mg Subcutaneous Q24H  . pantoprazole  40 mg Oral Daily  . polyethylene glycol  17  g Oral Daily  . psyllium  1 packet Oral Daily  . saccharomyces boulardii  250 mg Oral BID  . sodium chloride flush  3 mL Intravenous Q12H  . valACYclovir  1,000 mg Oral BID   Continuous Infusions: . dextrose 5 % and 0.9% NaCl 75 mL/hr at 06/08/20 1016    Principal Problem:   Intentional acetaminophen overdose (HCC) Active Problems:   Bipolar affective disorder (HCC)   Polysubstance abuse (HCC)    Hypokalemia   Agitation   Suicidal behavior   Elevated LFTs   Coagulopathy (HCC)   Mucus in stool   LOS: 6 days   A & P   Tylenol overdose: Ingested about 70 g of Tylenol. Tylenol level is more than 500 on admission.  LFTs trended up ,now significantly elevated.  INR of 1.7 on presentation,also trended up.  Started on NAC protocol, given a dose of fomepizole.  Poison control was following.  Liver enzymes appear to be trending down, and acidosis is resolving.  I appreciate GI's assistance. Right upper quadrant ultrasound was performed and has demonstrated Cholelithiasis without evidence for acute cholecystitis. Fatty liver seen. She is currently tolerating a regular diet. NAC protocol has been completed. GI has signed off. The patient is medically cleared for discharge to psychiatric facility. Awaiting placement.  Metabolic acidosis: Resolved on bicarb drip.  Will stop bicarbonate.  Suicidal attempt: Patient ingested Tylenol with suicidal attempt.  She has history of suicidal attempts in the past.  She has history of depression/bipolar disorder.  Psychiatry consulted.  Recommended inpatient psychiatric admission when medically stable.  Patient has been IVCed. She is medically cleared for discharge to psychiatric facility.  Polysubstance abuse: UDS positive for amphetamines, cocaine, THC. Counseled for cessation.  Hypokalemia: Resolved. Being supplemented with potassium.  Abdomen pain/nausea: Resolved. Continue Protonix, antiemetics.  This is most likely from gastric irritation from Tylenol overdose.  Her abdominal pain has resolved.  Accidental exposure to Covid patient: While in the emergency department, another patient who was in the same room with her ,was diagnosed with Covid.  Repeat Covid test done on 06/03/20 is negative.  She is asymptomatic. She will need to continue to quarantine for 14 days after exposure upon discharge. This may complicate inpatient psychiatric discharge.  I  have seen and examined this patient myself. I have spent 30 minutes in her evaluation and care.  DVT Prophylaxis: Lovenox CODE STATUS: Full Code Family Communication: None available Disposition:  Status is: Inpatient  Remains inpatient appropriate because: Lack of safe discharge. Pt is medically cleared to discharge to inpatient psychiatric facility. Will need to complete 14 day quarantine at facility due to inadvertent exposure in ED. 7 more days required.   Dispo: The patient is from: Home              Anticipated d/c is to: Inpatient psychiatric bed.              Anticipated d/c date is: 2 days              Patient currently is medically cleared for discharge.  Refugia Laneve, DO [facility] Direct contact: see [email or web address]  7PM-7AM contact night coverage as above 06/08/2020, 1:05 PM  LOS: 3 days

## 2020-06-08 NOTE — TOC Progression Note (Signed)
Transition of Care [facility]) - Progression Note    Patient Details  Name: Tiffany Wong MRN: [id number] Date of Birth: 1991/07/14  [facility]) CM/SW Contact  Armanda Heritage, RN Phone Number: 06/08/2020, 2:05 PM  Clinical Narrative:  CM noted patient is medically stable for transfer to an inpatient psychiatric hospital.  CM reviewed psych consult note. Patient is currently IVC, remains unable to contract for safety per MD.  CSW in the process of filing IVC paperwork to extend commitment.  CM spoke with [facility] and [place] reps who state no beds are available today.  [place] reports it is possible a bed may be available for patient tomorrow.  TOC team to continue to follow and assist with placement into inpatient psych facility.       Expected Discharge Plan: Psychiatric Hospital Barriers to Discharge: Psych Bed not available  Expected Discharge Plan and Services Expected Discharge Plan: Psychiatric Hospital   Discharge Planning Services: CM Consult   Living arrangements for the past 2 months: Hotel/Motel                                       Social Determinants of Health (SDOH) Interventions    Readmission Risk Interventions No flowsheet data found.

## 2020-06-08 NOTE — Progress Notes (Signed)
Pt here for suicide attempt. Regretted decision after taking a bottle of tylenol. Mood swings uncontrollable and dramatically different crying, happy, angry. She is unpredictable outbursts but mostly normal pleasant mood.

## 2020-06-08 NOTE — Social Work (Signed)
TOC CSW has IVC'd pt.  Dr. Gerri Lins signed all documents needed in IVC.  Faxed IVC to [employer]  CSW currently waiting on information from Magistrate to contact [employer].  CSW will continue to follow for dc needs.  Dalilah Curlin Tarpley-Carter, MSW, LCSW-A                  [facility] Transitions of Care Clinical Social Worker [email or web address] [phone]
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
## 2020-06-09 LAB — COMPREHENSIVE METABOLIC PANEL
ALT: 339 U/L — ABNORMAL HIGH (ref 0–44)
AST: 20 U/L (ref 15–41)
Albumin: 3.8 g/dL (ref 3.5–5.0)
Alkaline Phosphatase: 55 U/L (ref 38–126)
Anion gap: 9 (ref 5–15)
BUN: 6 mg/dL (ref 6–20)
CO2: 25 mmol/L (ref 22–32)
Calcium: 9 mg/dL (ref 8.9–10.3)
Chloride: 110 mmol/L (ref 98–111)
Creatinine, Ser: 0.56 mg/dL (ref 0.44–1.00)
GFR calc Af Amer: >60 mL/min (ref >60)
GFR calc non Af Amer: >60 mL/min (ref >60)
Glucose, Bld: 91 mg/dL (ref 70–99)
Potassium: 3.6 mmol/L (ref 3.5–5.1)
Sodium: 144 mmol/L (ref 135–145)
Total Bilirubin: 0.8 mg/dL (ref 0.3–1.2)
Total Protein: 7 g/dL (ref 6.5–8.1)

## 2020-06-09 MED ORDER — QUETIAPINE FUMARATE 25 MG PO TABS
25.0000 mg | ORAL_TABLET | Freq: Every evening | ORAL | Status: DC | PRN
  Administered 2020-06-09 (×2): 25 mg via ORAL
  Filled 2020-06-09 (×2): qty 1

## 2020-06-09 NOTE — Progress Notes (Signed)
Spoke at length with patient about this situation and how she is feeling currently. Pt still with emotional mood swings related to feeling like she cannot get the help that she needs. States "I wish I had stopped myself and never taken those pills but my anxiety level was too high and got the best of me". Pt expresses the desire to be able to set up and maintain a mental health plan in order to be compliant with medications and also receive the therapy she so desperately needs. When talking about her children, she shows thoughtful and content emotions, however she says most of her depression comes from having to raise her kids alone and do everything by herself. Patient with no current thoughts of suicide, rather thoughts of wanting to improve her mental health. She is frustrated because she feels isolated here in the hospital. She is requesting face to face interaction with MD and social worker in the morning to answer questions for her. Will place sticky note on chart. Will continue to monitor and offer support. Sitter at bedside for constant observation.

## 2020-06-09 NOTE — Plan of Care (Signed)

## 2020-06-09 NOTE — Progress Notes (Signed)
PROGRESS NOTE  Tiffany Wong QQP:[id number] DOB: January 10, 1991 DOA: 06/01/2020 PCP: Medicine, [facility]  Brief History   Patient is a 29 year old female with history of depression/polysubstance abuse/bipolar disorder , Suicidal attempts in the past who presented to the emergency department with complaints of intentional Tylenol overdose.  She reported ingesting 3 bottles of Tylenol.  On her presentation, she was somnolent, later agitated.  Poison control was contacted.  She was also hypotensive on presentation.  Tylenol level was more than 500.  UDS was positive for MDMA, cocaine, THC.  She was started on IV fluids, NAC protocol.  Given a dose of fomepizole.  She endorses that she took the pills to kill herself because she felt depressed.  Patient has been IVCed.  Plan for admission to inpatient psych after medical stabilization.  Also started on bicarb drip for severe acidosis.  Her liver enzymes elevated significantly on 06/03/2020 evening.  GI consulted on 06/04/2020.   She is found to have acute hepatic injury, however coagulopathy and transminitis are improving. NAC has been continued until liver enzymes are clearly trending downward. She will require transfer to a liver transplant center should she fail to show steady improvement with NAC and/or development of encephalopathy. Stool studies are pending for evaluation of the patient's concerns for possible parasites and mucous in the stool. Constipation was relieved with 4 BM's yesterday. NAC protocol has been completed.  The patient is medically cleared for discharge. She is pending placement at an inpatient psychiatric facility. However, given that she was exposed to COVID-19 in the ED, she will need to quarantine for 14 days. Today is day 8. I do not know how this affect her placement.  The patient is having mood swings. She was re-evaluated by psychiatry yesterday. She again was declared a danger to herself. Recommendation remains for  disposition to inpatient psychiatry.  Consultants  . Gastroenterology . Psychiatry  Procedures  . None  Antibiotics   Anti-infectives (From admission, onward)   Start     Dose/Rate Route Frequency Ordered Stop   06/07/20 1800  valACYclovir (VALTREX) tablet 1,000 mg        1,000 mg Oral 2 times daily 06/07/20 1535       Subjective  The patient is in bed. No new complaints.  Objective   Vitals:  Vitals:   06/08/20 1800 06/09/20 0452  BP: 129/85 105/69  Pulse: (!) 56 (!) 48  Resp: 18 17  Temp: 98.3 F (36.8 C) 98.4 F (36.9 C)  SpO2: 100% 100%   Exam:  Constitutional:  . The patient is awake, alert, and oriented x 3. No acute distress. Respiratory:  . No increased work of breathing. . No wheezes, rales, or rhonchi . No tactile fremitus Cardiovascular:  . Regular rate and rhythm . No murmurs, ectopy, or gallups. . No lateral PMI. No thrills. Abdomen:  . Abdomen is soft, non-tender, non-distended . No hernias, masses, or organomegaly . Normoactive bowel sounds.  Musculoskeletal:  . No cyanosis, clubbing, or edema Skin:  . No rashes, lesions, ulcers . palpation of skin: no induration or nodules Neurologic:  . CN 2-12 intact . Sensation all 4 extremities intact Psychiatric:  . Mental status: Mood Depressed - Affect flat.  I have personally reviewed the following:   Today's Data  . Vitals, CMP  Imaging  . RUQ [place]: Cholelithiasis without sonographic evidence of acute cholecystits and fatty liver.  Scheduled Meds: . enoxaparin (LOVENOX) injection  40 mg Subcutaneous Q24H  . pantoprazole  40 mg Oral Daily  . polyethylene glycol  17 g Oral Daily  . psyllium  1 packet Oral Daily  . saccharomyces boulardii  250 mg Oral BID  . sodium chloride flush  3 mL Intravenous Q12H  . valACYclovir  1,000 mg Oral BID   Continuous Infusions: . dextrose 5 % and 0.9% NaCl 75 mL/hr at 06/08/20 1016    Principal Problem:   Intentional acetaminophen overdose  (HCC) Active Problems:   Bipolar affective disorder (HCC)   Polysubstance abuse (HCC)   Hypokalemia   Agitation   Suicidal behavior   Elevated LFTs   Coagulopathy (HCC)   Mucus in stool   LOS: 7 days   A & P   Tylenol overdose: Ingested about 70 g of Tylenol. Tylenol level is more than 500 on admission.  LFTs trended up ,now significantly elevated.  INR of 1.7 on presentation,also trended up.  Started on NAC protocol, given a dose of fomepizole.  Poison control was following.  Liver enzymes appear to be trending down, and acidosis is resolving.  I appreciate GI's assistance. Right upper quadrant ultrasound was performed and has demonstrated Cholelithiasis without evidence for acute cholecystitis. Fatty liver seen. She is currently tolerating a regular diet. NAC protocol has been completed. GI has signed off. The patient is medically cleared for discharge to psychiatric facility. Awaiting placement. This will likely be delayed due to the patient's need to complete quarantine before she can go to a facility.  Metabolic acidosis: Resolved on bicarb drip.  Will stop bicarbonate.  Suicidal attempt: Patient ingested Tylenol with suicidal attempt.  She has history of suicidal attempts in the past.  She has history of depression/bipolar disorder.  Psychiatry was re-consulted. They continue to recommended inpatient psychiatric admission when medically stable.  Patient has been IVCed. She is medically cleared for discharge to psychiatric facility, but is awaiting completion of quarantine before she can go.  Polysubstance abuse: UDS positive for amphetamines, cocaine, THC. Counseled for cessation.  Hypokalemia: Resolved. Being supplemented with potassium.  Abdomen pain/nausea: Resolved. Continue Protonix, antiemetics.  This is most likely from gastric irritation from Tylenol overdose.  Her abdominal pain has resolved.  Accidental exposure to Covid patient: While in the emergency department,  another patient who was in the same room with her ,was diagnosed with Covid.  Repeat Covid test done on 06/03/20 is negative.  She is asymptomatic. She will need to continue to quarantine for 14 days after exposure upon discharge. This may complicate inpatient psychiatric discharge.  I have seen and examined this patient myself. I have spent 32 minutes in her evaluation and care.  DVT Prophylaxis: Lovenox CODE STATUS: Full Code Family Communication: None available Disposition: Inpatient psychiatric treatment. Delayed by need to complete quarantine. Status is: Inpatient  Remains inpatient appropriate because: Lack of safe discharge. Pt is medically cleared to discharge to inpatient psychiatric facility. Will need to complete 14 day quarantine at facility due to inadvertent exposure in ED. 5 more days required.   Dispo: The patient is from: Home              Anticipated d/c is to: Inpatient psychiatric bed.              Anticipated d/c date is: 2 days              Patient currently is medically cleared for discharge.  Tiffany Newville, DO [facility] Direct contact: see [email or web address]  7PM-7AM contact night coverage as above 06/08/2020, 1:05 PM  LOS: 3 days

## 2020-06-09 NOTE — TOC Progression Note (Signed)
Transition of Care [facility]) - Progression Note    Patient Details  Name: Tiffany Wong MRN: [id number] Date of Birth: May 06, 1991  [facility] CM/SW Contact  Lennart Pall, LCSW Phone Number: 06/09/2020, 1:31 PM  Clinical Narrative:    Met with pt and explained that we continue to work on [facility] unit placement for her per Psychiatry recommendation.  Pt remains under IVC which was renewed yesterday.  Pt upset that she may go to [facility] and says that is "too far".  Explained the process of bed search and IVC noting that we must look at all IP units.   Spoke with Audree Camel with [facility] who is reviewing pt's info and has reached out to [place] for them to review as well (info actually sent yesterday but no answers).  Will keep MD and team posted with their responses.   Expected Discharge Plan: Psychiatric Hospital Barriers to Discharge: Psych Bed not available  Expected Discharge Plan and Services Expected Discharge Plan: [facility]   Discharge Planning Services: CM Consult   Living arrangements for the past 2 months: Hotel/Motel                                       Social Determinants of Health (SDOH) Interventions    Readmission Risk Interventions No flowsheet data found.

## 2020-06-10 ENCOUNTER — Encounter (HOSPITAL_COMMUNITY): Payer: Self-pay | Admitting: Family

## 2020-06-10 ENCOUNTER — Other Ambulatory Visit: Payer: Self-pay

## 2020-06-10 ENCOUNTER — Inpatient Hospital Stay (HOSPITAL_COMMUNITY)
Admission: AD | Admit: 2020-06-10 | Discharge: 2020-06-14 | DRG: 885 | Disposition: A | Discharge status: Home / Self Care | Payer: Medicaid Other | Attending: Psychiatry | Admitting: Psychiatry

## 2020-06-10 DIAGNOSIS — K219 Gastro-esophageal reflux disease without esophagitis: Secondary | ICD-10-CM | POA: Diagnosis present

## 2020-06-10 DIAGNOSIS — G4700 Insomnia, unspecified: Secondary | ICD-10-CM | POA: Diagnosis present

## 2020-06-10 DIAGNOSIS — F411 Generalized anxiety disorder: Secondary | ICD-10-CM | POA: Diagnosis present

## 2020-06-10 DIAGNOSIS — F319 Bipolar disorder, unspecified: Principal | ICD-10-CM | POA: Diagnosis present

## 2020-06-10 DIAGNOSIS — F17210 Nicotine dependence, cigarettes, uncomplicated: Secondary | ICD-10-CM | POA: Diagnosis present

## 2020-06-10 DIAGNOSIS — Z20822 Contact with and (suspected) exposure to covid-19: Secondary | ICD-10-CM | POA: Diagnosis present

## 2020-06-10 DIAGNOSIS — K5900 Constipation, unspecified: Secondary | ICD-10-CM | POA: Diagnosis present

## 2020-06-10 DIAGNOSIS — R451 Restlessness and agitation: Secondary | ICD-10-CM | POA: Diagnosis present

## 2020-06-10 DIAGNOSIS — F3113 Bipolar disorder, current episode manic without psychotic features, severe: Secondary | ICD-10-CM

## 2020-06-10 DIAGNOSIS — T391X2A Poisoning by 4-Aminophenol derivatives, intentional self-harm, initial encounter: Secondary | ICD-10-CM | POA: Diagnosis not present

## 2020-06-10 DIAGNOSIS — F332 Major depressive disorder, recurrent severe without psychotic features: Secondary | ICD-10-CM | POA: Diagnosis present

## 2020-06-10 DIAGNOSIS — D689 Coagulation defect, unspecified: Secondary | ICD-10-CM | POA: Diagnosis not present

## 2020-06-10 DIAGNOSIS — B009 Herpesviral infection, unspecified: Secondary | ICD-10-CM | POA: Diagnosis present

## 2020-06-10 HISTORY — DX: Anxiety disorder, unspecified: F41.9

## 2020-06-10 LAB — COMPREHENSIVE METABOLIC PANEL
ALT: 259 U/L — ABNORMAL HIGH (ref 0–44)
AST: 22 U/L (ref 15–41)
Albumin: 3.8 g/dL (ref 3.5–5.0)
Alkaline Phosphatase: 58 U/L (ref 38–126)
Anion gap: 12 (ref 5–15)
BUN: 7 mg/dL (ref 6–20)
CO2: 19 mmol/L — ABNORMAL LOW (ref 22–32)
Calcium: 8.5 mg/dL — ABNORMAL LOW (ref 8.9–10.3)
Chloride: 107 mmol/L (ref 98–111)
Creatinine, Ser: 0.58 mg/dL (ref 0.44–1.00)
GFR calc Af Amer: >60 mL/min (ref >60)
GFR calc non Af Amer: >60 mL/min (ref >60)
Glucose, Bld: 117 mg/dL — ABNORMAL HIGH (ref 70–99)
Potassium: 3.6 mmol/L (ref 3.5–5.1)
Sodium: 138 mmol/L (ref 135–145)
Total Bilirubin: 0.4 mg/dL (ref 0.3–1.2)
Total Protein: 7 g/dL (ref 6.5–8.1)

## 2020-06-10 LAB — SARS CORONAVIRUS 2 BY RT PCR (HOSPITAL ORDER, PERFORMED IN ~~LOC~~ HOSPITAL LAB): SARS Coronavirus 2: NEGATIVE

## 2020-06-10 MED ORDER — POLYETHYLENE GLYCOL 3350 17 G PO PACK
17.0000 g | PACK | Freq: Every day | ORAL | 0 refills | Status: DC
Start: 2020-06-11 — End: 2021-03-12

## 2020-06-10 MED ORDER — SACCHAROMYCES BOULARDII 250 MG PO CAPS
250.0000 mg | ORAL_CAPSULE | Freq: Two times a day (BID) | ORAL | 0 refills | Status: DC
Start: 2020-06-10 — End: 2021-04-30

## 2020-06-10 MED ORDER — MAGNESIUM HYDROXIDE 400 MG/5ML PO SUSP: 30.0000 mL | Freq: Every day | ORAL | Status: DC | PRN

## 2020-06-10 MED ORDER — THIAMINE HCL 100 MG PO TABS
100.0000 mg | ORAL_TABLET | Freq: Every day | ORAL | Status: DC
  Administered 2020-06-11 – 2020-06-14 (×4): 100 mg via ORAL
  Filled 2020-06-10 (×6): qty 1

## 2020-06-10 MED ORDER — LOPERAMIDE HCL 2 MG PO CAPS: 2.0000 mg | ORAL_CAPSULE | ORAL | Status: AC | PRN

## 2020-06-10 MED ORDER — NICOTINE 14 MG/24HR TD PT24
14.0000 mg | MEDICATED_PATCH | Freq: Every day | TRANSDERMAL | Status: DC
  Administered 2020-06-11 – 2020-06-14 (×4): 14 mg via TRANSDERMAL
  Filled 2020-06-10 (×6): qty 1

## 2020-06-10 MED ORDER — POLYETHYLENE GLYCOL 3350 17 G PO PACK
17.0000 g | PACK | Freq: Every day | ORAL | Status: DC
  Administered 2020-06-12 – 2020-06-13 (×2): 17 g via ORAL
  Filled 2020-06-10 (×5): qty 1

## 2020-06-10 MED ORDER — VALACYCLOVIR HCL 500 MG PO TABS
1000.0000 mg | ORAL_TABLET | Freq: Two times a day (BID) | ORAL | Status: DC
  Administered 2020-06-10 – 2020-06-14 (×8): 1000 mg via ORAL
  Filled 2020-06-10 (×12): qty 2

## 2020-06-10 MED ORDER — TRAZODONE HCL 50 MG PO TABS: 50.0000 mg | ORAL_TABLET | Freq: Every evening | ORAL | Status: DC | PRN

## 2020-06-10 MED ORDER — PSYLLIUM 95 % PO PACK
1.0000 | PACK | Freq: Every day | ORAL | Status: DC
  Administered 2020-06-13: 1 via ORAL
  Filled 2020-06-10 (×5): qty 1

## 2020-06-10 MED ORDER — ALUM & MAG HYDROXIDE-SIMETH 200-200-20 MG/5ML PO SUSP: 30.0000 mL | ORAL | Status: DC | PRN

## 2020-06-10 MED ORDER — PROMETHAZINE HCL 25 MG/ML IJ SOLN: 12.5000 mg | Freq: Four times a day (QID) | INTRAMUSCULAR | 0 refills | Status: DC | PRN

## 2020-06-10 MED ORDER — HYDROXYZINE HCL 25 MG PO TABS
25.0000 mg | ORAL_TABLET | Freq: Four times a day (QID) | ORAL | Status: AC | PRN
  Administered 2020-06-10 – 2020-06-12 (×3): 25 mg via ORAL
  Filled 2020-06-10 (×4): qty 1

## 2020-06-10 MED ORDER — ONDANSETRON 4 MG PO TBDP: 4.0000 mg | ORAL_TABLET | Freq: Four times a day (QID) | ORAL | Status: AC | PRN

## 2020-06-10 MED ORDER — ENSURE ENLIVE PO LIQD
237.0000 mL | Freq: Two times a day (BID) | ORAL | Status: DC
  Administered 2020-06-11 – 2020-06-14 (×6): 237 mL via ORAL

## 2020-06-10 MED ORDER — NICOTINE POLACRILEX 2 MG MT GUM
2.0000 mg | CHEWING_GUM | Freq: Once | OROMUCOSAL | Status: DC
  Filled 2020-06-10: qty 1

## 2020-06-10 MED ORDER — PANTOPRAZOLE SODIUM 40 MG PO TBEC
40.0000 mg | DELAYED_RELEASE_TABLET | Freq: Every day | ORAL | Status: DC
  Administered 2020-06-11 – 2020-06-14 (×4): 40 mg via ORAL
  Filled 2020-06-10 (×6): qty 1

## 2020-06-10 MED ORDER — PSYLLIUM 95 % PO PACK: 1.0000 | PACK | Freq: Every day | ORAL | 0 refills | Status: DC

## 2020-06-10 MED ORDER — LORAZEPAM 1 MG PO TABS
1.0000 mg | ORAL_TABLET | Freq: Four times a day (QID) | ORAL | Status: DC | PRN
  Administered 2020-06-11: 1 mg via ORAL
  Filled 2020-06-10: qty 1

## 2020-06-10 MED ORDER — ACETAMINOPHEN 325 MG PO TABS: 650.0000 mg | ORAL_TABLET | Freq: Four times a day (QID) | ORAL | Status: DC | PRN

## 2020-06-10 MED ORDER — VALACYCLOVIR HCL 1 G PO TABS
1000.0000 mg | ORAL_TABLET | Freq: Two times a day (BID) | ORAL | 0 refills | Status: AC
Start: 2020-06-10 — End: 2020-06-17

## 2020-06-10 MED ORDER — SACCHAROMYCES BOULARDII 250 MG PO CAPS
250.0000 mg | ORAL_CAPSULE | Freq: Two times a day (BID) | ORAL | Status: DC
  Administered 2020-06-10 – 2020-06-14 (×8): 250 mg via ORAL
  Filled 2020-06-10 (×12): qty 1

## 2020-06-10 MED ORDER — QUETIAPINE FUMARATE 25 MG PO TABS
25.0000 mg | ORAL_TABLET | Freq: Every evening | ORAL | Status: DC | PRN
  Administered 2020-06-10 – 2020-06-13 (×3): 25 mg via ORAL
  Filled 2020-06-10 (×4): qty 1

## 2020-06-10 MED ORDER — ADULT MULTIVITAMIN W/MINERALS CH
1.0000 | ORAL_TABLET | Freq: Every day | ORAL | Status: DC
  Administered 2020-06-10 – 2020-06-14 (×5): 1 via ORAL
  Filled 2020-06-10 (×8): qty 1

## 2020-06-10 NOTE — Plan of Care (Signed)
Nurse discussed anxiety, depression and coping skills with patient.  

## 2020-06-10 NOTE — Progress Notes (Signed)
Discharge instructions given to patient and all questions were answered.  Patient was taken to [facility] by [employer].  I tried twice to call to give report to the nurse at [facility].  Did not receive phone call back before patient left.

## 2020-06-10 NOTE — Tx Team (Signed)
Initial Treatment Plan 06/10/2020 8:06 PM [name] ZTI:[id number]    PATIENT STRESSORS: Educational concerns Financial difficulties Health problems Marital or family conflict Occupational concerns Substance abuse   PATIENT STRENGTHS: Ability for insight Average or above average intelligence Capable of independent living Wellsite geologist fund of knowledge Motivation for treatment/growth Supportive family/friends Work skills   PATIENT IDENTIFIED PROBLEMS: "substance abuse"  "suicidal thoughts"  "anxiety"  "depressiosn"               DISCHARGE CRITERIA:  Ability to meet basic life and health needs Adequate post-discharge living arrangements Improved stabilization in mood, thinking, and/or behavior Medical problems require only outpatient monitoring Motivation to continue treatment in a less acute level of care Need for constant or close observation no longer present Reduction of life-threatening or endangering symptoms to within safe limits Safe-care adequate arrangements made Verbal commitment to aftercare and medication compliance Withdrawal symptoms are absent or subacute and managed without 24-hour nursing intervention  PRELIMINARY DISCHARGE PLAN: Attend aftercare/continuing care group Attend PHP/IOP Attend 12-step recovery group Outpatient therapy Participate in family therapy Placement in alternative living arrangements Return to previous living arrangement Return to previous work or school arrangements  PATIENT/FAMILY INVOLVEMENT: This treatment plan has been presented to and reviewed with the patient, Tiffany Wong.  The patient and family have been given the opportunity to ask questions and make suggestions.  Tiffany Wong, [place] 06/10/2020, 8:06 PM

## 2020-06-10 NOTE — Progress Notes (Signed)
Patient rated her day as an 8 out of 10 since she had a bad experience in the E.D. She claims that she was called names by the staff and is relieved to be here. Her goal for tomorrow is to have less anxiety.

## 2020-06-10 NOTE — Discharge Summary (Addendum)
Physician Discharge Summary  Tiffany Wong BJY:[id number] DOB: 11/26/1990 DOA: 06/01/2020  PCP: [facility]  Admit date: 06/01/2020 Discharge date: 06/10/2020  Recommendations for Outpatient Follow-up:  1. Discharge to inpatient psychiatric facility.  2. Check LFT's on 06/12/2020. Per GI Okay to start potentially hepatotoxic medications on  3. Follow up with PCP within 7-10 days following discharge from inpatient psychiatric facility. 4. I have discussed the patient's psychiatric meds with GI. Neurontin and buspar may be restarted now. May restart lamictal after one week and recheck LFT's one week after it is restarted.  Discharge Diagnoses: Principal diagnosis is #1 1. Suicide attempt 2. Tylenol overdose with hepatotoxicity 3. Depression 4. Genital herpes.  Discharge Condition: fair  Disposition: Behavioral health hospital  Diet recommendation: Regular  Filed Weights   06/01/20 2335  Weight: 45.4 kg    History of present illness: Tiffany Wong is a 29 y.o. female with medical history significant for depression and polysubstance abuse, now presenting to the emergency department after an intentional acetaminophen overdose. Patient's ability to provide history is limited by her clinical condition with somnolence and later agitation. Per report of EMS, the patient's significant other witnessed her and just approximately 140 doses of 500 mg acetaminophen at approximately 9:30 PM on 06/01/2020. EMS was called, patient was found to have blood pressure of 88/54, she was given a liter of normal saline, and brought into the ED.    ED Course: Upon arrival to the ED, patient is found to be saturating well on room air, normal heart and respiratory rates, and blood pressure 95/55. Chemistry panel is notable for potassium 3.3, bicarbonate 14, anion gap 17, and glucose 132. CBC features a slight normocytic anemia. Salicylate and ethanol levels are undetectable. Acetaminophen  level was > 500. UDS is positive for amphetamines, cocaine, and THC. Pregnancy test negative. Urinalysis with small hemoglobin and 20 ketones. COVID-19 screening test not yet resulted and EKG not yet performed. Patient was started on acetylcysteine, became agitated and psychotic, was IVCd and given Geodon.  Hospital Course:  [facility] were consulted to admit the patient. GI was consulted. Patient is a 29 year old female with history of depression/polysubstance abuse/bipolar disorder , Suicidal attempts in the past who presented to the emergency department with complaints of intentional Tylenol overdose. She reported ingesting 3 bottles of Tylenol. On her presentation, she was somnolent, later agitated. Poison control was contacted. She was also hypotensive on presentation. Tylenol level was more than 500. UDS was positive for MDMA, cocaine, THC. She was started on IV fluids, NAC protocol. Given a dose of fomepizole. She endorses that she took the pills to kill herself because she felt depressed. Patient has been IVCed. Plan for admission to inpatient psych after medical stabilization. Also started on bicarb drip for severe acidosis.Her liver enzymes elevated significantly on 06/03/2020 evening. GI consulted on 06/04/2020.   She is found to have acute hepatic injury, however coagulopathy and transminitis are improving. NAC has been continued until liver enzymes are clearly trending downward. She will require transfer to a liver transplant center should she fail to show steady improvement with NAC and/or development of encephalopathy. Stool studies are pending for evaluation of the patient's concerns for possible parasites and mucous in the stool. Constipation was relieved with 4 BM's yesterday. NAC protocol has been completed.  The patient is medically cleared for discharge. She is pending placement at an inpatient psychiatric facility. However, given that she was exposed to COVID-19  in the ED, she will need  to quarantine for 14 days. Today is day 10.  The patient is having mood swings. She was re-evaluated by psychiatry yesterday. She again was declared a danger to herself. Recommendation remains for disposition to inpatient psychiatry.  [facility] has communicated that with negative COVID test, the patient may be discharged to their facility today. Today the patient's LFT's have continued to trend towards normal with only elevation of LFT's being her ALT of 259. We have a question in to GI regarding when the patient may be restarted on potentially hepatotoxic medications.   The patient will be discharged to [facility] later today.  Today's assessment: S: The patient is resting quietly. No new complaints. O: Vitals:  Vitals:   06/10/20 0600 06/10/20 1130  BP: 103/68 109/62  Pulse: 95 90  Resp: 17 18  Temp: 97.9 F (36.6 C) 98.7 F (37.1 C)  SpO2: 100% 100%   Exam:  Constitutional:   The patient is awake, alert, and oriented x 3. No acute distress. Respiratory:   No increased work of breathing.  No wheezes, rales, or rhonchi  No tactile fremitus Cardiovascular:   Regular rate and rhythm  No murmurs, ectopy, or gallups.  No lateral PMI. No thrills. Abdomen:   Abdomen is soft, non-tender, non-distended  No hernias, masses, or organomegaly  Normoactive bowel sounds.  Musculoskeletal:   No cyanosis, clubbing, or edema Skin:   No rashes, lesions, ulcers  palpation of skin: no induration or nodules Neurologic:   CN 2-12 intact  Sensation all 4 extremities intact Psychiatric:   Mental status: Mood Depressed  Affect flat.  Discharge Instructions  Discharge Instructions    Activity as tolerated - No restrictions   Complete by: As directed    Call MD for:  severe uncontrolled pain   Complete by: As directed    Diet - low sodium heart healthy   Complete by: As directed    Discharge instructions   Complete by: As directed    Discharge to  inpatient psychiatric facility.  Check LFT's on 06/12/2020. Per GI Okay to start potentially hepatotoxic medications on  Follow up with PCP within 7-10 days following discharge from inpatient psychiatric facility.   Increase activity slowly   Complete by: As directed      Allergies as of 06/10/2020      Reactions   Aspirin Other (See Comments)   "Makes me bleed" Other reaction(s): Other (see comments) Nose bleeds Nose bleeds "Makes me bleed" Other reaction(s): Other (see comments) Nose bleeds      Medication List    STOP taking these medications   busPIRone 10 MG tablet Commonly known as: BUSPAR   gabapentin 100 MG capsule Commonly known as: NEURONTIN   gabapentin 400 MG capsule Commonly known as: NEURONTIN   hydrOXYzine 25 MG tablet Commonly known as: ATARAX/VISTARIL   hydrOXYzine 50 MG tablet Commonly known as: ATARAX/VISTARIL   lamoTRIgine 25 MG tablet Commonly known as: LAMICTAL   terconazole 0.4 % vaginal cream Commonly known as: TERAZOL 7     TAKE these medications   nicotine 21 mg/24hr patch Commonly known as: NICODERM CQ - dosed in mg/24 hours Place 1 patch (21 mg total) onto the skin daily. (May buy from over the counter): For smoking cessation   pantoprazole 40 MG tablet Commonly known as: PROTONIX Take 1 tablet (40 mg total) by mouth daily. For acid reflux   polyethylene glycol 17 g packet Commonly known as: MIRALAX / GLYCOLAX Take 17 g by mouth daily. Start taking on:  June 11, 2020   promethazine 25 MG/ML injection Commonly known as: PHENERGAN Inject 0.5 mLs (12.5 mg total) into the vein every 6 (six) hours as needed for nausea or vomiting.   psyllium 95 % Pack Commonly known as: HYDROCIL/METAMUCIL Take 1 packet by mouth daily. Start taking on: June 11, 2020   saccharomyces boulardii 250 MG capsule Commonly known as: FLORASTOR Take 1 capsule (250 mg total) by mouth 2 (two) times daily.   valACYclovir 1000 MG tablet Commonly known  as: VALTREX Take 1 tablet (1,000 mg total) by mouth 2 (two) times daily for 7 days. What changed: when to take this      Allergies  Allergen Reactions  . Aspirin Other (See Comments)    "Makes me bleed" Other reaction(s): Other (see comments) Nose bleeds Nose bleeds "Makes me bleed" Other reaction(s): Other (see comments) Nose bleeds    The results of significant diagnostics from this hospitalization (including imaging, microbiology, ancillary and laboratory) are listed below for reference.    Significant Diagnostic Studies: US Abdomen Limited RUQ  Result Date: 06/04/2020 CLINICAL DATA:  29 year old female with elevated LFTs. EXAM: ULTRASOUND ABDOMEN LIMITED RIGHT UPPER QUADRANT COMPARISON:  None. FINDINGS: Gallbladder: There are 2 stones within the gallbladder. No gallbladder wall thickening or pericholecystic fluid. Negative sonographic Murphy's sign. Common bile duct: Diameter: 2 mm Liver: There is diffuse increased liver echogenicity most commonly seen in the setting of fatty infiltration. Superimposed inflammation or fibrosis is not excluded. Clinical correlation is recommended. Portal vein is patent on color Doppler imaging with normal direction of blood flow towards the liver. Other: None. IMPRESSION: 1. Cholelithiasis without sonographic evidence of acute cholecystitis. 2. Fatty liver. Electronically Signed   By: Elgie Collard M.D.   On: 06/04/2020 17:08    Microbiology: Recent Results (from the past 240 hour(s))  SARS Coronavirus 2 by RT PCR (hospital order, performed in [facility] hospital lab) Nasopharyngeal Nasopharyngeal Swab     Status: None   Collection Time: 06/02/20 12:55 AM   Specimen: Nasopharyngeal Swab  Result Value Ref Range Status   SARS Coronavirus 2 NEGATIVE NEGATIVE Final    Comment: (NOTE) SARS-CoV-2 target nucleic acids are NOT DETECTED.  The SARS-CoV-2 RNA is generally detectable in upper and lower respiratory specimens during the acute phase of  infection. The lowest concentration of SARS-CoV-2 viral copies this assay can detect is 250 copies / mL. A negative result does not preclude SARS-CoV-2 infection and should not be used as the sole basis for treatment or other patient management decisions.  A negative result may occur with improper specimen collection / handling, submission of specimen other than nasopharyngeal swab, presence of viral mutation(s) within the areas targeted by this assay, and inadequate number of viral copies (<250 copies / mL). A negative result must be combined with clinical observations, patient history, and epidemiological information.  Fact Sheet for Patients:   BoilerBrush.com.cy  Fact Sheet for Healthcare Providers: https://pope.com/  This test is not yet approved or  cleared by the [place] FDA and has been authorized for detection and/or diagnosis of SARS-CoV-2 by FDA under an Emergency Use Authorization (EUA).  This EUA will remain in effect (meaning this test can be used) for the duration of the COVID-19 declaration under Section 564(b)(1) of the Act, 21 U.S.C. section 360bbb-3(b)(1), unless the authorization is terminated or revoked sooner.  Performed at [facility], [address] [place], [place] [address]   SARS Coronavirus 2 by RT PCR (hospital order, performed in  [facility] hospital lab) Nasopharyngeal Nasopharyngeal Swab     Status: None   Collection Time: 06/03/20 10:06 AM   Specimen: Nasopharyngeal Swab  Result Value Ref Range Status   SARS Coronavirus 2 NEGATIVE NEGATIVE Final    Comment: (NOTE) SARS-CoV-2 target nucleic acids are NOT DETECTED.  The SARS-CoV-2 RNA is generally detectable in upper and lower respiratory specimens during the acute phase of infection. The lowest concentration of SARS-CoV-2 viral copies this assay can detect is 250 copies / mL. A negative result does not preclude SARS-CoV-2  infection and should not be used as the sole basis for treatment or other patient management decisions.  A negative result may occur with improper specimen collection / handling, submission of specimen other than nasopharyngeal swab, presence of viral mutation(s) within the areas targeted by this assay, and inadequate number of viral copies (<250 copies / mL). A negative result must be combined with clinical observations, patient history, and epidemiological information.  Fact Sheet for Patients:   BoilerBrush.com.cyhttps://www.fda.gov/media/136312/download  Fact Sheet for Healthcare Providers: https://pope.com/https://www.fda.gov/media/136313/download  This test is not yet approved or  cleared by the [place] FDA and has been authorized for detection and/or diagnosis of SARS-CoV-2 by FDA under an Emergency Use Authorization (EUA).  This EUA will remain in effect (meaning this test can be used) for the duration of the COVID-19 declaration under Section 564(b)(1) of the Act, 21 U.S.C. section 360bbb-3(b)(1), unless the authorization is terminated or revoked sooner.  Performed at [facility], [address] [place], [place] [id number]      Labs: Basic Metabolic Panel: Recent Labs  Lab 06/06/20 0448 06/07/20 0538 06/08/20 0559 06/09/20 0451 06/10/20 0428  NA 141 143 141 144 138  K 3.5 4.1 3.4* 3.6 3.6  CL 110 112* 110 110 107  CO2 21* 24 23 25  19*  GLUCOSE 102* 92 93 91 117*  BUN <5* <5* <5* 6 7  CREATININE 0.51 0.53 0.55 0.56 0.58  CALCIUM 8.4* 8.7* 8.6* 9.0 8.5*   Liver Function Tests: Recent Labs  Lab 06/06/20 0448 06/07/20 0538 06/08/20 0559 06/09/20 0451 06/10/20 0428  AST 92* 43* 27 20 22   ALT 864* 569* 444* 339* 259*  ALKPHOS 55 50 52 55 58  BILITOT 0.9 0.8 0.8 0.8 0.4  PROT 6.4* 6.0* 6.4* 7.0 7.0  ALBUMIN 3.5 3.3* 3.5 3.8 3.8   No results for input(s): LIPASE, AMYLASE in the last 168 hours. No results for input(s): AMMONIA in the last 168 hours. CBC: Recent  Labs  Lab 06/04/20 0500 06/06/20 0448  WBC 5.9 4.2  NEUTROABS  --  2.2  HGB 10.6* 10.3*  HCT 31.6* 31.6*  MCV 87.3 90.0  PLT 109* 107*   Cardiac Enzymes: No results for input(s): CKTOTAL, CKMB, CKMBINDEX, TROPONINI in the last 168 hours. BNP: BNP (last 3 results) No results for input(s): BNP in the last 8760 hours.  ProBNP (last 3 results) No results for input(s): PROBNP in the last 8760 hours.  CBG: No results for input(s): GLUCAP in the last 168 hours.  Principal Problem:   Intentional acetaminophen overdose (HCC) Active Problems:   Bipolar affective disorder (HCC)   Polysubstance abuse (HCC)   Hypokalemia   Agitation   Suicidal behavior   Elevated LFTs   Coagulopathy (HCC)   Mucus in stool   Time coordinating discharge: 38 minutes.  Signed:        Gaege Sangalang, DO [facility]  06/10/2020, 1:21 PM

## 2020-06-10 NOTE — TOC Transition Note (Signed)
Transition of Care [facility]) - CM/SW Discharge Note   Patient Details  Name: Tiffany Wong MRN: [id number] Date of Birth: 16-Feb-1991  [facility] CM/SW Contact:  Amada Jupiter, LCSW Phone Number: 06/10/2020, 2:51 PM   Clinical Narrative:    Pt has been accepted to [facility] for inpatient psychiatric hospitalization.  COVID test today still negative.  The plan is to admit this afternoon at 5pm to room 304 -1. Spoke with Sgt. Lenox this afternoon and informed to call just prior to 5 pm to request transport.  IVC paperwork must accompany patient.   Pt aware and was going to contact her boyfriend to inform him of plans.     Final next level of care: Psychiatric Hospital Barriers to Discharge: Barriers Resolved   Patient Goals and CMS Choice Patient states their goals for this hospitalization and ongoing recovery are:: patient needs inpatient psychiatric treatment, currently IVC      Discharge Placement                Patient to be transferred to facility by: [facility]   Patient and family notified of of transfer: 06/10/20  Discharge Plan and Services   Discharge Planning Services: CM Consult            DME Arranged: N/A DME Agency: NA       HH Arranged: NA HH Agency: NA        Social Determinants of Health (SDOH) Interventions     Readmission Risk Interventions No flowsheet data found.

## 2020-06-10 NOTE — Progress Notes (Addendum)
Patient is 29 yrs old, numerous admissions to [facility].  Patient overdosed on tylenol, level over 500 in hospital.  Hx of depression, bipolar. SI in the past by overdosing.  Originally was covid + but now is covid -. Patient denied SI and HI, contracts for safety.  Denied A/V hallucinations.  Rated anxiety, depression and hopeless 10+.  Smokes cigarettes, sometimes 1 pack daily.  THC and cocaine recreationally in the past month.  One glass wine monthly.  Could not sleep and took adderall and then all this happened.  My kids see me, not as a failure.  I want to try to stop getting out of my troubles like this.  That sums it up.  Troubles will come and go.  I need to value myself more.  It's going to take time.  I want to see value in me.  It makes me feel like I'm not worth it.   Tattos on bilateral arms.  Eyebrow ring L eyebrow.  Lower lip ring. Two children born 2012 and 2020.  Has had vaginal bleeding since June 4th.  [facility], [address], [place], [place].  Needs to see dentist.  Wears contacts.  Has lost 5 lbs recently.  Denied being abused.  Mom is part of her support system.  Dad calls her daily.  Stays at "[facility] [address], [place] [place], [place]".  Has medicaid.  Some college.  Last BM yesterday.  Stressors:  Money, raising kids, works at [employer].  Would like to return to school.   Fall risk information given and reviewed with patient, low fall risk. Patient has been cooperative and pleasant.  Oriented to unit and given food/drink.

## 2020-06-11 DIAGNOSIS — F319 Bipolar disorder, unspecified: Principal | ICD-10-CM

## 2020-06-11 MED ORDER — BUSPIRONE HCL 10 MG PO TABS
10.0000 mg | ORAL_TABLET | Freq: Three times a day (TID) | ORAL | Status: DC
  Administered 2020-06-11 – 2020-06-14 (×8): 10 mg via ORAL
  Filled 2020-06-11 (×7): qty 1
  Filled 2020-06-11: qty 2
  Filled 2020-06-11 (×5): qty 1

## 2020-06-11 MED ORDER — VALACYCLOVIR HCL 500 MG PO TABS: 500.0000 mg | ORAL_TABLET | Freq: Every day | ORAL | Status: DC

## 2020-06-11 MED ORDER — GABAPENTIN 100 MG PO CAPS
200.0000 mg | ORAL_CAPSULE | Freq: Two times a day (BID) | ORAL | Status: DC
  Administered 2020-06-11 – 2020-06-14 (×6): 200 mg via ORAL
  Filled 2020-06-11 (×8): qty 2

## 2020-06-11 MED ORDER — GABAPENTIN 400 MG PO CAPS
400.0000 mg | ORAL_CAPSULE | Freq: Every day | ORAL | Status: DC
  Administered 2020-06-11 – 2020-06-13 (×3): 400 mg via ORAL
  Filled 2020-06-11 (×6): qty 1

## 2020-06-11 MED ORDER — HYDROXYZINE HCL 50 MG PO TABS
50.0000 mg | ORAL_TABLET | Freq: Every evening | ORAL | Status: DC | PRN
  Administered 2020-06-11 – 2020-06-13 (×3): 50 mg via ORAL
  Filled 2020-06-11 (×3): qty 1

## 2020-06-11 MED ORDER — LAMOTRIGINE 25 MG PO TABS
25.0000 mg | ORAL_TABLET | Freq: Every day | ORAL | Status: DC
  Administered 2020-06-11 – 2020-06-13 (×3): 25 mg via ORAL
  Filled 2020-06-11 (×5): qty 1

## 2020-06-11 MED ORDER — PANTOPRAZOLE SODIUM 40 MG PO TBEC
40.0000 mg | DELAYED_RELEASE_TABLET | Freq: Every day | ORAL | Status: DC
Start: 2020-06-11 — End: 2020-06-11

## 2020-06-11 NOTE — Progress Notes (Signed)
Pt was asking about what medications she had scheduled for tonight. Pt was informed and educated about her medications. Pt said that she gets a stuffy nose from taking trazadone. Therefore, NP Nira Conn was notified and trazadone order was discontinued. NP also notified of need to discontinue PRN tylenol order since pt had a tylenol overdose. Pt requested nicotine patch for smoking cessation since she was smoking more than 0.5 pack/day and order was obtained. Pt's goal is to have "better follow up this time." Pt feels that "once you miss an appointment, it takes time for them to fit you in." Pt said she would like to work on the negative thoughts she has in her head, "you're garbage." Pt has been pleasant on the unit and was playing a game with the other pts. Pt denies SI/HI and AVH. Active listening, reassurance, and support provided. Q 15 min safety checks continue. Pt's safety has been maintained.     06/10/20 1945  Psych Admission Type (Psych Patients Only)  Admission Status Involuntary  Psychosocial Assessment  Patient Complaints Anxiety;Depression;Hopelessness;Sadness;Worrying  Eye Contact Fair  Facial Expression Anxious;Worried  Affect Depressed;Appropriate to circumstance;Anxious;Sad  Speech Logical/coherent  Therapist, sports Cooperative;Anxious;Appropriate to situation  Mood Depressed;Anxious;Sad  Thought Process  Coherency WDL  Content Blaming others  Delusions None reported or observed  Perception WDL  Hallucination None reported or observed  Judgment Poor  Confusion None  Danger to Self  Current suicidal ideation? Denies  Danger to Others  Danger to Others None reported or observed

## 2020-06-11 NOTE — Progress Notes (Signed)
   06/11/20 0619  Vital Signs  Temp 98.1 F (36.7 C)  Temp Source Oral  Pulse Rate 84  BP 111/70  BP Location Right Arm  BP Method Automatic  Patient Position (if appropriate) Standing   D: Patient denies SI/HI but reported that she heard a female voice call our her name loudly and correctly when she was alone sleeping in her room. Patient rated both anxiety and depression 4/10. A:  Patient took scheduled medicine.  Support and encouragement provided Routine safety checks conducted every 15 minutes. Patient  Informed to notify staff with any concerns.   R: Safety maintained.

## 2020-06-11 NOTE — Progress Notes (Signed)
   06/10/20 1945  COVID-19 Daily Checkoff  Have you had a fever (temp > 37.80C/100F)  in the past 24 hours?  No  COVID-19 EXPOSURE  Have you traveled outside the state in the past 14 days? No  Have you been in contact with someone with a confirmed diagnosis of COVID-19 or PUI in the past 14 days without wearing appropriate PPE? No  Have you been living in the same home as a person with confirmed diagnosis of COVID-19 or a PUI (household contact)? No  Have you been diagnosed with COVID-19? No

## 2020-06-11 NOTE — Tx Team (Signed)
Interdisciplinary Treatment and Diagnostic Plan Update  06/11/2020 Time of Session: 150p Tiffany Wong MRN: [id number]  Principal Diagnosis: Bipolar 1 disorder ([place])  Secondary Diagnoses: Principal Problem:   Bipolar 1 disorder ([place]) Active Problems:   MDD (major depressive disorder), recurrent severe, without psychosis ([place])   Current Medications:  Current Facility-Administered Medications  Medication Dose Route Frequency Provider Last Rate Last Admin  . alum & mag hydroxide-simeth (MAALOX/MYLANTA) 200-200-20 MG/5ML suspension 30 mL  30 mL Oral Q4H PRN Connye Burkitt, NP      . busPIRone (BUSPAR) tablet 10 mg  10 mg Oral TID Sharma Covert, MD   10 mg at 06/11/20 1241  . feeding supplement (ENSURE ENLIVE) (ENSURE ENLIVE) liquid 237 mL  237 mL Oral BID BM Lindon Romp A, NP      . gabapentin (NEURONTIN) capsule 200 mg  200 mg Oral BID Sharma Covert, MD      . gabapentin (NEURONTIN) capsule 400 mg  400 mg Oral QHS Sharma Covert, MD      . hydrOXYzine (ATARAX/VISTARIL) tablet 25 mg  25 mg Oral Q6H PRN Connye Burkitt, NP   25 mg at 06/11/20 0816  . hydrOXYzine (ATARAX/VISTARIL) tablet 50 mg  50 mg Oral QHS PRN Sharma Covert, MD      . lamoTRIgine (LAMICTAL) tablet 25 mg  25 mg Oral Daily Sharma Covert, MD   25 mg at 06/11/20 1240  . loperamide (IMODIUM) capsule 2-4 mg  2-4 mg Oral PRN Connye Burkitt, NP      . magnesium hydroxide (MILK OF MAGNESIA) suspension 30 mL  30 mL Oral Daily PRN Connye Burkitt, NP      . multivitamin with minerals tablet 1 tablet  1 tablet Oral Daily Connye Burkitt, NP   1 tablet at 06/11/20 0817  . nicotine (NICODERM CQ - dosed in mg/24 hours) patch 14 mg  14 mg Transdermal Daily Lindon Romp A, NP   14 mg at 06/11/20 0815  . nicotine polacrilex (NICORETTE) gum 2 mg  2 mg Oral Once Lindon Romp A, NP      . ondansetron (ZOFRAN-ODT) disintegrating tablet 4 mg  4 mg Oral Q6H PRN Connye Burkitt, NP      . pantoprazole (PROTONIX) EC tablet 40  mg  40 mg Oral Daily Connye Burkitt, NP   40 mg at 06/11/20 0816  . polyethylene glycol (MIRALAX / GLYCOLAX) packet 17 g  17 g Oral Daily Connye Burkitt, NP      . psyllium (HYDROCIL/METAMUCIL) 1 packet  1 packet Oral Daily Connye Burkitt, NP      . QUEtiapine (SEROQUEL) tablet 25 mg  25 mg Oral QHS PRN Connye Burkitt, NP   25 mg at 06/10/20 2116  . saccharomyces boulardii (FLORASTOR) capsule 250 mg  250 mg Oral BID Connye Burkitt, NP   250 mg at 06/11/20 9629  . thiamine tablet 100 mg  100 mg Oral Daily Connye Burkitt, NP   100 mg at 06/11/20 5284  . valACYclovir (VALTREX) tablet 1,000 mg  1,000 mg Oral BID Connye Burkitt, NP   1,000 mg at 06/11/20 1324   PTA Medications: Medications Prior to Admission  Medication Sig Dispense Refill Last Dose  . saccharomyces boulardii (FLORASTOR) 250 MG capsule Take 1 capsule (250 mg total) by mouth 2 (two) times daily. 60 capsule 0 Past Month at Unknown time  . valACYclovir (VALTREX) 1000 MG tablet Take 1 tablet (  1,000 mg total) by mouth 2 (two) times daily for 7 days. 14 tablet 0 Past Week at Unknown time  . polyethylene glycol (MIRALAX / GLYCOLAX) 17 g packet Take 17 g by mouth daily. (Patient taking differently: Take 17 g by mouth daily as needed for moderate constipation. ) 14 each 0     Patient Stressors: Educational concerns Financial difficulties Health problems Marital or family conflict Occupational concerns Substance abuse  Patient Strengths: Ability for insight Average or above average intelligence Capable of independent living Curator fund of knowledge Motivation for treatment/growth Supportive family/friends Work skills  Treatment Modalities: Medication Management, Group therapy, Case management,  1 to 1 session with clinician, Psychoeducation, Recreational therapy.   Physician Treatment Plan for Primary Diagnosis: Bipolar 1 disorder ([place]) Long Term Goal(s): Improvement in symptoms so as ready for  discharge Improvement in symptoms so as ready for discharge   Short Term Goals: Ability to identify changes in lifestyle to reduce recurrence of condition will improve Ability to verbalize feelings will improve Ability to disclose and discuss suicidal ideas Ability to demonstrate self-control will improve Ability to identify and develop effective coping behaviors will improve Ability to maintain clinical measurements within normal limits will improve Compliance with prescribed medications will improve Ability to identify triggers associated with substance abuse/mental health issues will improve  Medication Management: Evaluate patient's response, side effects, and tolerance of medication regimen.  Therapeutic Interventions: 1 to 1 sessions, Unit Group sessions and Medication administration.  Evaluation of Outcomes: Not Met  Physician Treatment Plan for Secondary Diagnosis: Principal Problem:   Bipolar 1 disorder ([place]) Active Problems:   MDD (major depressive disorder), recurrent severe, without psychosis ([place])  Long Term Goal(s): Improvement in symptoms so as ready for discharge Improvement in symptoms so as ready for discharge   Short Term Goals: Ability to identify changes in lifestyle to reduce recurrence of condition will improve Ability to verbalize feelings will improve Ability to disclose and discuss suicidal ideas Ability to demonstrate self-control will improve Ability to identify and develop effective coping behaviors will improve Ability to maintain clinical measurements within normal limits will improve Compliance with prescribed medications will improve Ability to identify triggers associated with substance abuse/mental health issues will improve     Medication Management: Evaluate patient's response, side effects, and tolerance of medication regimen.  Therapeutic Interventions: 1 to 1 sessions, Unit Group sessions and Medication administration.  Evaluation of  Outcomes: Not Met   RN Treatment Plan for Primary Diagnosis: Bipolar 1 disorder ([place]) Long Term Goal(s): Knowledge of disease and therapeutic regimen to maintain health will improve  Short Term Goals: Ability to remain free from injury will improve, Ability to verbalize frustration and anger appropriately will improve, Ability to demonstrate self-control, Ability to participate in decision making will improve, Ability to verbalize feelings will improve, Ability to disclose and discuss suicidal ideas, Ability to identify and develop effective coping behaviors will improve and Compliance with prescribed medications will improve  Medication Management: RN will administer medications as ordered by provider, will assess and evaluate patient's response and provide education to patient for prescribed medication. RN will report any adverse and/or side effects to prescribing provider.  Therapeutic Interventions: 1 on 1 counseling sessions, Psychoeducation, Medication administration, Evaluate responses to treatment, Monitor vital signs and CBGs as ordered, Perform/monitor CIWA, COWS, AIMS and Fall Risk screenings as ordered, Perform wound care treatments as ordered.  Evaluation of Outcomes: Not Met   LCSW Treatment Plan for Primary Diagnosis: Bipolar 1 disorder ([place])  Long Term Goal(s): Safe transition to appropriate next level of care at discharge, Engage patient in therapeutic group addressing interpersonal concerns.  Short Term Goals: Engage patient in aftercare planning with referrals and resources, Increase social support, Increase ability to appropriately verbalize feelings, Increase emotional regulation, Facilitate acceptance of mental health diagnosis and concerns, Facilitate patient progression through stages of change regarding substance use diagnoses and concerns, Identify triggers associated with mental health/substance abuse issues and Increase skills for wellness and recovery  Therapeutic  Interventions: Assess for all discharge needs, 1 to 1 time with Social worker, Explore available resources and support systems, Assess for adequacy in community support network, Educate family and significant other(s) on suicide prevention, Complete Psychosocial Assessment, Interpersonal group therapy.  Evaluation of Outcomes: Not Met   Progress in Treatment: Attending groups: Yes. Participating in groups: Yes. Taking medication as prescribed: Yes. Toleration medication: Yes. Family/Significant other contact made: No, will contact:  family Patient understands diagnosis: Yes. Discussing patient identified problems/goals with staff: Yes. Medical problems stabilized or resolved: Yes. Denies suicidal/homicidal ideation: Yes. Issues/concerns per patient self-inventory: No. Other:   New problem(s) identified: No, Describe:  none  New Short Term/Long Term Goal(s):  Patient Goals:  Pt not in attendance  Discharge Plan or Barriers:   Reason for Continuation of Hospitalization: Medication stabilization  Estimated Length of Stay:   Attendees: Patient:  Pt not in attendance  06/11/2020 1:50 PM  Physician:  06/11/2020 1:50 PM  Nursing:  06/11/2020 1:50 PM  RN Care Manager: 06/11/2020 1:50 PM  Social Worker: Macon Large 06/11/2020 1:50 PM  Recreational Therapist:  06/11/2020 1:50 PM  Other:  06/11/2020 1:50 PM  Other:  06/11/2020 1:50 PM  Other: 06/11/2020 1:50 PM    Scribe for Treatment Team: Bethann Berkshire, LCSW 06/11/2020 1:50 PM

## 2020-06-11 NOTE — [facility] Suicide Risk Assessment (Signed)
[facility] Admission Suicide Risk Assessment   Nursing information obtained from:  Patient Demographic factors:  Adolescent or young adult, Low socioeconomic status Current Mental Status:  Self-harm thoughts, Self-harm behaviors Loss Factors:  Financial problems / change in socioeconomic status Historical Factors:  Prior suicide attempts, Impulsivity Risk Reduction Factors:  Responsible for children under 30 years of age, Living with another person, especially a relative, Sense of responsibility to family, Religious beliefs about death, Employed  Total Time spent with patient: 30 minutes Principal Problem: <principal problem not specified> Diagnosis:  Active Problems:   MDD (major depressive disorder), recurrent severe, without psychosis ([facility])  Subjective Data: Patient is seen and examined.  Patient is a 29 year old female with a past psychiatric history significant for bipolar disorder, posttraumatic stress disorder, history of postpartum depression, cocaine use disorder, amphetamine use disorder and cannabis use disorder who originally presented to the [facility] emergency department on 8/16 after an intentional acetaminophen overdose.  Per EMS the patient's significant other witnessed her ingested approximately 140 doses of 500 mg of acetaminophen at 9:30 PM on 06/01/2020.  She was found to have a blood pressure of approximately 88/54.  She was given normal saline in EMS and brought to the emergency department.  Her acetaminophen level in the emergency department was greater than 500.  Her drug screen was positive for amphetamines, cocaine and marijuana.  She was started on acetylcysteine.  She was apparently psychotic, agitated, given Geodon and as well placed under involuntary commitment.  Her liver function enzymes noted on 06/03/2020 showed an AST of 3046, and an ALT of 2711.  Her INR was elevated at 2.1.  Her blood pressure did stabilize.  She continued on N-acetylcysteine.   Psychiatric consultation was obtained on 06/08/2020.  Recommendations were made for medications as well as transfer to our facility when stable.  She was transferred to our facility on 06/10/2020.  On examination today she is pressured, anxious, and a bit tangential.  She noted that part of the process of doing the overdose was distressed financially of what was going on in her life, and as well had used cocaine and amphetamines prior to admission.  She stated that she had done well on medications until she had missed her follow-up appointment, and ran out of medication somewhere between May and June.  She denied suicidal ideation currently.  She was admitted to the hospital for evaluation and stabilization.  Continued Clinical Symptoms:  Alcohol Use Disorder Identification Test Final Score (AUDIT): 3 The "Alcohol Use Disorders Identification Test", Guidelines for Use in Primary Care, Second Edition.  World Science writer [facility]). Score between 0-7:  no or low risk or alcohol related problems. Score between 8-15:  moderate risk of alcohol related problems. Score between 16-19:  high risk of alcohol related problems. Score 20 or above:  warrants further diagnostic evaluation for alcohol dependence and treatment.   CLINICAL FACTORS:   Bipolar Disorder:   Mixed State Alcohol/Substance Abuse/Dependencies More than one psychiatric diagnosis Previous Psychiatric Diagnoses and Treatments   Musculoskeletal: Strength & Muscle Tone: within normal limits Gait & Station: normal Patient leans: N/A  Psychiatric Specialty Exam: Physical Exam Vitals and nursing note reviewed.  Constitutional:      Appearance: Normal appearance.  HENT:     Head: Normocephalic and atraumatic.  Pulmonary:     Effort: Pulmonary effort is normal.  Neurological:     General: No focal deficit present.     Mental Status: She is alert and oriented to  person, place, and time.     Review of Systems  Blood pressure  111/70, pulse 84, temperature 98.1 F (36.7 C), temperature source Oral, resp. rate 18, height 5' (1.524 m), weight 42.6 kg, SpO2 100 %, not currently breastfeeding.Body mass index is 18.36 kg/m.  General Appearance: Casual  Eye Contact:  Good  Speech:  Pressured  Volume:  Increased  Mood:  Dysphoric and Euphoric  Affect:  Congruent  Thought Process:  Coherent and Descriptions of Associations: Tangential  Orientation:  Full (Time, Place, and Person)  Thought Content:  Tangential  Suicidal Thoughts:  No  Homicidal Thoughts:  No  Memory:  Immediate;   Fair Recent;   Fair Remote;   Fair  Judgement:  Intact  Insight:  Fair  Psychomotor Activity:  Increased  Concentration:  Concentration: Fair and Attention Span: Fair  Recall:  [employer] of Knowledge:  Fair  Language:  Good  Akathisia:  Negative  Handed:  Right  AIMS (if indicated):     Assets:  Desire for Improvement Resilience  ADL's:  Intact  Cognition:  WNL  Sleep:  Number of Hours: 6.75      COGNITIVE FEATURES THAT CONTRIBUTE TO RISK:  None    SUICIDE RISK:   Mild:  Suicidal ideation of limited frequency, intensity, duration, and specificity.  There are no identifiable plans, no associated intent, mild dysphoria and related symptoms, good self-control (both objective and subjective assessment), few other risk factors, and identifiable protective factors, including available and accessible social support.  PLAN OF CARE: Patient is seen and examined.  Patient is a 29 year old female with the above-stated past psychiatric history who is admitted secondary to an intentional overdose of acetaminophen.  She has a past psychiatric history significant for posttraumatic stress disorder, bipolar disorder and polysubstance use disorder.  She will be admitted to the hospital.  She will be integrated in the milieu.  She will be encouraged to attend groups.  She is willing at this point to go back on the previous medications which were  effective.  She will be placed back on BuSpar, gabapentin, hydroxyzine, Lamictal.  From a medical standpoint we will continue her Protonix, MiraLAX, valacyclovir.  Review of her admission laboratories revealed normal electrolytes with her AST of 22 but her ALT still elevated 259.  From 2 days ago it was 339, so it is continuing to come down.  She had iron studies done on 8/16 which were all essentially normal.  Her CBC showed a mild anemia with a hemoglobin of 10.3 and hematocrit of 31.6.  Her platelets were low at 107,000.  This is most likely secondary to the liver dysfunction secondary to her overdose.  8 days ago her platelet count was 171,000, then 7 days ago had dropped to 109,000.  5 days ago it was 107,000.  We will check that again during the course of hospitalization.  Her PT and INR as of 06/06/2020 were 14 and 1.1.  Her most recent acetaminophen level on 8/17 was less than 10.  Salicylate was less than 7.  Pregnancy test on 8/15 was negative.  TSH was low normal at 0.420.  Her hepatitis panel for A, B and C were all nonreactive.  HIV was nonreactive as well.  Drug screen on admission was positive for amphetamines, cocaine and marijuana.  Her vital signs are stable, she is currently afebrile.  I certify that inpatient services furnished can reasonably be expected to improve the patient's condition.   Antonieta Pert,  MD 06/11/2020, 10:52 AM

## 2020-06-11 NOTE — Progress Notes (Signed)
The patient's positive event for the day is that she did not experience any anxiety or panic attacks. She admits to having had one stressful moment, but handled the situation by taking PRN medication. Her goal for tomorrow is to have her mood level out.
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
## 2020-06-12 MED ORDER — WHITE PETROLATUM EX OINT
TOPICAL_OINTMENT | CUTANEOUS | Status: AC
  Filled 2020-06-12: qty 5
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
## 2020-06-12 NOTE — Progress Notes (Signed)
D:  Patient's self inventory sheet, patient has fair sleep, sleep medication helpful.  Fair appetite, normal energy level, good concentration.  Rated depression 2, denied hopeless, anxiety 6.  Denied withdrawals.  Denied SI.  Denied physical problems.  Denied physical pain.  Goal is positive self talk.  Plans to be mindful of things I say about myself.  "Thanks for the netflix but what happened to the groups."  No discharge plans at this time. A:  Medications administered per MD orders.  Emotional support and encouragement given patient. R:  Patient denied SI and HI, contracts for safety.  Denied A/V hallucinations. Safety maintained with 15 minute checks.

## 2020-06-12 NOTE — Progress Notes (Signed)
Pt said she had a "groovy day." Pt said that the seroquel she took last night caused her to have an AH. Pt said she was asleep and then she heard a "deep dude voice" say her name clearly. Pt has a low self-esteem and said that "I don't feel like I'm shit." Pt said she's had constant misfortunes and she tends to blame herself whether it be due to poor timing or when it comes to work. Pt has had a hard time acknowledging her worth. 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 Pt said that she's been overly concerned about managing her finances. Pt said she's working on having realistic affirmations about herself. Pt encouraged to think positive about herself and start the day by saying 3 positive things about herself. Active listening, reassurance, and support provided. Pt denies SI/HI and AVH. Q 15 min safety checks continue. Pt's safety has been maintained.    06/11/20 2035  Psych Admission Type (Psych Patients Only)  Admission Status Involuntary  Psychosocial Assessment  Patient Complaints Anxiety;Depression;Sadness;Worrying  Eye Contact Fair  Facial Expression Anxious;Sad;Worried  Affect Depressed;Anxious;Sad  Speech Logical/coherent;Pressured;Loud  Interaction Assertive  Psychologist, educational Cooperative;Anxious;Calm  Mood Depressed;Anxious;Guilty;Sad;Pleasant  Thought Process  Coherency WDL  Content Blaming self;Obsessions  Delusions None reported or observed  Perception WDL  Hallucination None reported or observed  Judgment Poor  Confusion None  Danger to Self  Current suicidal ideation? Denies  Danger to Others  Danger to Others None reported or observed

## 2020-06-12 NOTE — [facility] Suicide Risk Assessment (Signed)
[facility] INPATIENT:  Family/Significant Other Suicide Prevention Education  Suicide Prevention Education:  Education Completed; Significant Other Tiffany Wong [phone],  (name of family member/significant other) has been identified by the patient as the family member/significant other with whom the patient will be residing, and identified as the person(s) who will aid the patient in the event of a mental health crisis (suicidal ideations/suicide attempt).  With written consent from the patient, the family member/significant other has been provided the following suicide prevention education, prior to the and/or following the discharge of the patient.  The suicide prevention education provided includes the following:  Suicide risk factors  Suicide prevention and interventions  National Suicide Hotline telephone number  [facility] assessment telephone number  [facility] Emergency Assistance 911  [facility] and/or Residential Mobile Crisis Unit telephone number  Request made of family/significant other to:  Remove weapons (e.g., guns, rifles, knives), all items previously/currently identified as safety concern.    Remove drugs/medications (over-the-counter, prescriptions, illicit drugs), all items previously/currently identified as a safety concern.  The family member/significant other verbalizes understanding of the suicide prevention education information provided.  The family member/significant other agrees to remove the items of safety concern listed above.  Tiffany Wong expresses no concerns about pt returning to extended stay with him.   Tiffany Wong 06/12/2020, 3:56 PM
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
## 2020-06-12 NOTE — Progress Notes (Signed)
NUTRITION ASSESSMENT RD working remotely.  Pt identified as at risk on the Malnutrition Screen Tool  INTERVENTION: - continue Ensure Enlive BID, each supplement provides 350 kcal and 20 grams of protein. [facility] staff to continue to encourage PO intakes of meals and snacks.   NUTRITION DIAGNOSIS: Unintentional weight loss related to sub-optimal intake as evidenced by pt report.   Goal: Pt to meet >/= 90% of their estimated nutrition needs.  Monitor:  PO intake  Assessment:  Patient admitted for worsening bipolar symptoms with psychosis and suicide attempt by OD on Tylenol.   Per chart review, weight on 8/24 was 94 lb, weight on 8/15 was 100 lb, weight on 5/30 was 102 lb, and weight on 4/20 was 95 lb.   Ensure Enlive was ordered BID starting yesterday. Patient accepted one of the two bottles of supplement offered to her yesterday.    29 y.o. female  Height: Ht Readings from Last 1 Encounters:  06/10/20 5' (1.524 m)    Weight: Wt Readings from Last 1 Encounters:  06/10/20 42.6 kg    Weight Hx: Wt Readings from Last 10 Encounters:  06/10/20 42.6 kg  06/01/20 45.4 kg  03/16/20 46.6 kg  02/05/20 43.1 kg  01/11/19 53.8 kg  11/30/18 52.6 kg  11/25/18 54.5 kg  11/22/18 54 kg  11/02/18 48.1 kg  10/17/18 47.7 kg    BMI:  Body mass index is 18.36 kg/m. Pt meets criteria for underweight based on current BMI.  Estimated Nutritional Needs: Kcal: 25-30 kcal/kg Protein: > 1 gram protein/kg Fluid: 1 ml/kcal  Diet Order:  Diet Order            Diet regular Room service appropriate? Yes; Fluid consistency: Thin  Diet effective now                Pt is also offered choice of unit snacks mid-morning and mid-afternoon.  Pt is eating as desired.   Lab results and medications reviewed.      Trenton Gammon, MS, RD, LDN, CNSC Inpatient Clinical Dietitian RD pager # available in [facility]  After hours/weekend pager # available in [facility]

## 2020-06-12 NOTE — Progress Notes (Signed)
   06/11/20 2035  COVID-19 Daily Checkoff  Have you had a fever (temp > 37.80C/100F)  in the past 24 hours?  No  COVID-19 EXPOSURE  Have you traveled outside the state in the past 14 days? No  Have you been in contact with someone with a confirmed diagnosis of COVID-19 or PUI in the past 14 days without wearing appropriate PPE? No  Have you been living in the same home as a person with confirmed diagnosis of COVID-19 or a PUI (household contact)? No  Have you been diagnosed with COVID-19? No
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
## 2020-06-12 NOTE — Progress Notes (Signed)
Patient states that she had a talk with the treatment team regarding discharge. The patient expressed multiple complaints about the lack of programming offered at [facility]. She would like to be more engaged with the program versus watching Netflix all day. Her goal for tomorrow is to find out more details about her discharge planning.

## 2020-06-13 MED ORDER — FOLIC ACID 1 MG PO TABS
1.0000 mg | ORAL_TABLET | Freq: Every day | ORAL | Status: DC
  Administered 2020-06-13 – 2020-06-14 (×2): 1 mg via ORAL
  Filled 2020-06-13 (×4): qty 1

## 2020-06-13 MED ORDER — LAMOTRIGINE 25 MG PO TABS
50.0000 mg | ORAL_TABLET | Freq: Every day | ORAL | Status: DC
  Administered 2020-06-14: 50 mg via ORAL
  Filled 2020-06-13 (×2): qty 2
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
## 2020-06-13 NOTE — Progress Notes (Signed)
Cooperative with treatment , medication compliant, patient talked about looking forward to discharge to be back with her kids, no issues to report on shift at this time.

## 2020-06-13 NOTE — Progress Notes (Signed)
   06/13/20 0945  Psych Admission Type (Psych Patients Only)  Admission Status Involuntary  Psychosocial Assessment  Patient Complaints Anxiety;Depression ("I really miss my kids. I've been in the hospital for a whil)  Eye Contact Fair  Facial Expression Anxious  Affect Appropriate to circumstance  Speech Logical/coherent;Pressured;Loud;Other (Comment) (hyperverbal)  Interaction Assertive  Motor Activity Fidgety  Appearance/Hygiene Disheveled  Behavior Characteristics Cooperative  Mood Anxious;Depressed  Thought Process  Coherency WDL  Content Blaming self;Obsessions  Delusions None reported or observed  Perception WDL  Hallucination None reported or observed  Judgment Poor  Confusion None  Danger to Self  Current suicidal ideation? Denies  Danger to Others  Danger to Others None reported or observed

## 2020-06-13 NOTE — Progress Notes (Signed)
Adult Psychoeducational Group Note  Date:  06/13/2020 Time:  11:36 AM  Group Topic/Focus:  Goals Group:   The focus of this group is to help patients establish daily goals to achieve during treatment and discuss how the patient can incorporate goal setting into their daily lives to aide in recovery.  Participation Level:  Active  Participation Quality:  Appropriate  Affect:  Appropriate  Cognitive:  Appropriate  Insight: Appropriate  Engagement in Group:  Engaged  Modes of Intervention:  Discussion  Additional Comments:  Patient attended goal setting and Emotion Regulation group and participated.   Jatara Huettner W [place] Krist 06/13/2020, 11:36 AM

## 2020-06-13 NOTE — Progress Notes (Signed)
Recreation Therapy Notes  Date:  8.27.21 Time: 0930 Location: [address] Dayroom  Group Topic: Stress Management  Goal Area(s) Addresses:  Patient will identify positive stress management techniques. Patient will identify benefits of using stress management post d/c.  Behavioral Response: Engaged  Intervention: Stress Management  Activity : Meditation.  LRT played a meditation that focused on letting go of the past and not letting it affect the present.  Patients were to listen and follow along as meditation played to engage in activity.    Education:  Stress Management, Discharge Planning.   Education Outcome: Acknowledges Education  Clinical Observations/Feedback: Pt attended and participated in group activity.    [name], LRT/CTRS         Tiffany Wong A 06/13/2020 10:07 AM 

## 2020-06-14 LAB — COMPREHENSIVE METABOLIC PANEL
ALT: 118 U/L — ABNORMAL HIGH (ref 0–44)
AST: 30 U/L (ref 15–41)
Albumin: 4.6 g/dL (ref 3.5–5.0)
Alkaline Phosphatase: 67 U/L (ref 38–126)
Anion gap: 11 (ref 5–15)
BUN: 13 mg/dL (ref 6–20)
CO2: 22 mmol/L (ref 22–32)
Calcium: 9.2 mg/dL (ref 8.9–10.3)
Chloride: 105 mmol/L (ref 98–111)
Creatinine, Ser: 0.71 mg/dL (ref 0.44–1.00)
GFR calc Af Amer: >60 mL/min (ref >60)
GFR calc non Af Amer: >60 mL/min (ref >60)
Glucose, Bld: 95 mg/dL (ref 70–99)
Potassium: 4.1 mmol/L (ref 3.5–5.1)
Sodium: 138 mmol/L (ref 135–145)
Total Bilirubin: 0.5 mg/dL (ref 0.3–1.2)
Total Protein: 8.2 g/dL — ABNORMAL HIGH (ref 6.5–8.1)

## 2020-06-14 MED ORDER — GABAPENTIN 100 MG PO CAPS: 200.0000 mg | ORAL_CAPSULE | Freq: Two times a day (BID) | ORAL | 0 refills | Status: DC

## 2020-06-14 MED ORDER — PANTOPRAZOLE SODIUM 40 MG PO TBEC
40.0000 mg | DELAYED_RELEASE_TABLET | Freq: Every day | ORAL | 0 refills | Status: DC
Start: 2020-06-15 — End: 2021-04-30

## 2020-06-14 MED ORDER — PSYLLIUM 95 % PO PACK: 1.0000 | PACK | Freq: Every day | ORAL | 0 refills | Status: DC

## 2020-06-14 MED ORDER — QUETIAPINE FUMARATE 25 MG PO TABS
25.0000 mg | ORAL_TABLET | Freq: Every evening | ORAL | 0 refills | Status: DC | PRN
Start: 2020-06-14 — End: 2020-06-30

## 2020-06-14 MED ORDER — NICOTINE 14 MG/24HR TD PT24
14.0000 mg | MEDICATED_PATCH | Freq: Every day | TRANSDERMAL | 0 refills | Status: DC
Start: 2020-06-15 — End: 2021-04-30

## 2020-06-14 MED ORDER — GABAPENTIN 400 MG PO CAPS: 400.0000 mg | ORAL_CAPSULE | Freq: Every day | ORAL | 0 refills | Status: DC

## 2020-06-14 MED ORDER — BUSPIRONE HCL 10 MG PO TABS
10.0000 mg | ORAL_TABLET | Freq: Three times a day (TID) | ORAL | 0 refills | Status: DC
Start: 2020-06-14 — End: 2020-06-30

## 2020-06-14 MED ORDER — LAMOTRIGINE 25 MG PO TABS: 50.0000 mg | ORAL_TABLET | Freq: Every day | ORAL | 0 refills | Status: DC

## 2020-06-14 MED ORDER — HYDROXYZINE HCL 50 MG PO TABS
50.0000 mg | ORAL_TABLET | Freq: Every evening | ORAL | 0 refills | Status: DC | PRN
Start: 2020-06-14 — End: 2020-06-30

## 2020-06-14 NOTE — BHH Group Notes (Signed)
LCSW Group Therapy Note  06/14/2020   10:00-11:00am   Type of Therapy and Topic:  Group Therapy: Anger Cues and Responses  Participation Level:  Active   Description of Group:   In this group, patients learned how to recognize the physical, cognitive, emotional, and behavioral responses they have to anger-provoking situations.  They identified a recent time they became angry and how they reacted.  They analyzed how their reaction was possibly beneficial and how it was possibly unhelpful.  The group discussed a variety of healthier coping skills that could help with such a situation in the future.  Focus was placed on how helpful it is to recognize the underlying emotions to our anger, because working on those can lead to a more permanent solution as well as our ability to focus on the important rather than the urgent.  Therapeutic Goals: 1. Patients will remember their last incident of anger and how they felt emotionally and physically, what their thoughts were at the time, and how they behaved. 2. Patients will identify how their behavior at that time worked for them, as well as how it worked against them. 3. Patients will explore possible new behaviors to use in future anger situations. 4. Patients will learn that anger itself is normal and cannot be eliminated, and that healthier reactions can assist with resolving conflict rather than worsening situations.  Summary of Patient Progress:  Patient engaged and was inspirational sharing with her peers and encouraging them. Patient share she got angry when her "butt went numb watching netflix and there were no groups". Patient reports she needs groups and those that don't like them should just keep coming. Patient reports she copes by singing, showers and crying. Pt said goodbye to everyone at the end of group as she was discharging.   Therapeutic Modalities:   Cognitive Behavioral Therapy  Shellia Cleverly

## 2020-06-14 NOTE — Progress Notes (Signed)
Nursing discharge note: Patient discharged home per MD order.  Patient received all personal belongings from unit and locker.  Reviewed AVS/transition record with patient and she indicates understanding.  Patient will follow up with [facility].  Patient denies any thoughts of self harm. She left ambulatory with her father.

## 2020-06-14 NOTE — BHH Suicide Risk Assessment (Signed)
[facility] Discharge Suicide Risk Assessment   Principal Problem: Bipolar 1 disorder [facility]) Discharge Diagnoses: Principal Problem:   Bipolar 1 disorder (HCC) Active Problems:   MDD (major depressive disorder), recurrent severe, without psychosis (HCC)   Total Time spent with patient: 15 minutes  Musculoskeletal: Strength & Muscle Tone: within normal limits Gait & Station: normal Patient leans: N/A  Psychiatric Specialty Exam: Review of Systems  All other systems reviewed and are negative.   Blood pressure 120/87, pulse 97, temperature 98 F (36.7 C), temperature source Oral, resp. rate 18, height 5' (1.524 m), weight 42.6 kg, SpO2 100 %, not currently breastfeeding.Body mass index is 18.36 kg/m.  General Appearance: Casual  Eye Contact::  Good  Speech:  Normal Rate409  Volume:  Normal  Mood:  Anxious  Affect:  Congruent  Thought Process:  Coherent and Descriptions of Associations: Intact  Orientation:  Full (Time, Place, and Person)  Thought Content:  Logical  Suicidal Thoughts:  No  Homicidal Thoughts:  No  Memory:  Immediate;   Fair Recent;   Fair Remote;   Fair  Judgement:  Intact  Insight:  Fair  Psychomotor Activity:  Increased  Concentration:  Fair  Recall:  [employer] of Knowledge:Fair  Language: Good  Akathisia:  Negative  Handed:  Right  AIMS (if indicated):     Assets:  Desire for Improvement Housing Resilience  Sleep:  Number of Hours: 6.75  Cognition: WNL  ADL's:  Intact   Mental Status Per Nursing Assessment::   On Admission:  Self-harm thoughts, Self-harm behaviors  Demographic Factors:  Low socioeconomic status and Unemployed  Loss Factors: Financial problems/change in socioeconomic status  Historical Factors: Impulsivity  Risk Reduction Factors:   Responsible for children under 60 years of age, Sense of responsibility to family and Living with another person, especially a relative  Continued Clinical Symptoms:  Bipolar Disorder:    Depressive phase  Cognitive Features That Contribute To Risk:  None    Suicide Risk:  Minimal: No identifiable suicidal ideation.  Patients presenting with no risk factors but with morbid ruminations; may be classified as minimal risk based on the severity of the depressive symptoms   Follow-up Information    [facility]. Go on 06/26/2020.   Specialty: Urgent Care Why: You have a therapy appointment on September 9th at 10am. You have a medication management appointment on Sept 14th at 830am.  Contact information: [address] [place] [address] [phone]              Plan Of Care/Follow-up recommendations:  Activity:  ad lib  Antonieta Pert, MD 06/14/2020, 7:52 AM

## 2020-06-14 NOTE — Progress Notes (Signed)
  [facility] Adult Case Management Discharge Plan :  Will you be returning to the same living situation after discharge:  Yes,  with boyfriend and child At discharge, do you have transportation home?: No. Do you have the ability to pay for your medications: Yes,  insurance  Release of information consent forms completed and emailed to HIM.  Patient to Follow up at:  Follow-up Information    [facility]. Go on 06/26/2020.   Specialty: Urgent Care Why: You have a therapy appointment on September 9th at 10am. You have a medication management appointment on Sept 14th at 830am.  Contact information: [address] [place] [address] [phone]              Next level of care provider has access to [facility] Link:yes  Safety Planning and Suicide Prevention discussed: Yes,  with significant other  Have you used any form of tobacco in the last 30 days? (Cigarettes, Smokeless Tobacco, Cigars, and/or Pipes): Yes  Has patient been referred to the Quitline?: N/A patient is not a smoker  Patient has been referred for addiction treatment: Yes  Lynnell Chad, LCSW 06/14/2020, 10:37 AM

## 2020-06-14 NOTE — Progress Notes (Signed)
   06/14/20 1114  COVID-19 Daily Checkoff  Have you had a fever (temp > 37.80C/100F)  in the past 24 hours?  No  If you have had runny nose, nasal congestion, sneezing in the past 24 hours, has it worsened? No  COVID-19 EXPOSURE  Have you traveled outside the state in the past 14 days? No  Have you been in contact with someone with a confirmed diagnosis of COVID-19 or PUI in the past 14 days without wearing appropriate PPE? No  Have you been living in the same home as a person with confirmed diagnosis of COVID-19 or a PUI (household contact)? No  Have you been diagnosed with COVID-19? No

## 2020-06-14 NOTE — Progress Notes (Signed)
   06/14/20 0113  Psych Admission Type (Psych Patients Only)  Admission Status Involuntary  Psychosocial Assessment  Patient Complaints None  Eye Contact Fair  Facial Expression Animated  Speech Rapid  Interaction Assertive  Motor Activity Hyperactive  Appearance/Hygiene Unremarkable  Behavior Characteristics Cooperative;Appropriate to situation  Mood Pleasant  Thought Process  Coherency WDL  Content WDL  Delusions WDL  Perception WDL  Hallucination None reported or observed  Judgment WDL  Confusion WDL  Danger to Self  Current suicidal ideation? Denies  Danger to Others  Danger to Others None reported or observed  Pleasant on approach tonight. Reports mood good and denies SI. Reports discharge tomorrow. Unknown if she is being discharged or not tomorrow. Hyperverbal and interacting well with peers.
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
## 2020-06-26 ENCOUNTER — Ambulatory Visit (HOSPITAL_COMMUNITY): Payer: Medicaid Other | Admitting: Licensed Clinical Social Worker

## 2020-06-30 ENCOUNTER — Ambulatory Visit (INDEPENDENT_AMBULATORY_CARE_PROVIDER_SITE_OTHER): Payer: Medicaid Other | Admitting: Psychiatry

## 2020-06-30 ENCOUNTER — Other Ambulatory Visit: Payer: Self-pay

## 2020-06-30 DIAGNOSIS — F319 Bipolar disorder, unspecified: Secondary | ICD-10-CM | POA: Diagnosis not present

## 2020-06-30 DIAGNOSIS — F411 Generalized anxiety disorder: Secondary | ICD-10-CM | POA: Diagnosis not present

## 2020-06-30 DIAGNOSIS — F331 Major depressive disorder, recurrent, moderate: Secondary | ICD-10-CM

## 2020-06-30 MED ORDER — GABAPENTIN 100 MG PO CAPS: 200.0000 mg | ORAL_CAPSULE | Freq: Two times a day (BID) | ORAL | 2 refills | Status: DC

## 2020-06-30 MED ORDER — LAMOTRIGINE 25 MG PO TABS: 50.0000 mg | ORAL_TABLET | Freq: Every day | ORAL | 2 refills | Status: DC

## 2020-06-30 MED ORDER — GABAPENTIN 400 MG PO CAPS: 400.0000 mg | ORAL_CAPSULE | Freq: Every day | ORAL | 2 refills | Status: DC

## 2020-06-30 MED ORDER — QUETIAPINE FUMARATE 25 MG PO TABS: 25.0000 mg | ORAL_TABLET | Freq: Every evening | ORAL | 2 refills | Status: DC | PRN

## 2020-06-30 MED ORDER — HYDROXYZINE HCL 25 MG PO TABS: 25.0000 mg | ORAL_TABLET | Freq: Every evening | ORAL | 2 refills | Status: DC | PRN

## 2020-06-30 MED ORDER — BUSPIRONE HCL 15 MG PO TABS: 15.0000 mg | ORAL_TABLET | Freq: Three times a day (TID) | ORAL | 2 refills | Status: DC
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
## 2020-08-20 ENCOUNTER — Ambulatory Visit (INDEPENDENT_AMBULATORY_CARE_PROVIDER_SITE_OTHER): Payer: Medicaid Other | Admitting: Licensed Clinical Social Worker

## 2020-08-20 ENCOUNTER — Other Ambulatory Visit: Payer: Self-pay

## 2020-08-20 ENCOUNTER — Encounter (HOSPITAL_COMMUNITY): Payer: Self-pay | Admitting: Licensed Clinical Social Worker

## 2020-08-20 DIAGNOSIS — F411 Generalized anxiety disorder: Secondary | ICD-10-CM

## 2020-08-20 DIAGNOSIS — F332 Major depressive disorder, recurrent severe without psychotic features: Secondary | ICD-10-CM

## 2020-08-20 DIAGNOSIS — F319 Bipolar disorder, unspecified: Secondary | ICD-10-CM
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
## 2020-08-29 ENCOUNTER — Encounter (HOSPITAL_COMMUNITY): Payer: Medicaid Other | Admitting: Psychiatry

## 2020-09-08 ENCOUNTER — Other Ambulatory Visit: Payer: Self-pay

## 2020-09-08 ENCOUNTER — Encounter (HOSPITAL_COMMUNITY): Payer: Self-pay | Admitting: Psychiatry

## 2020-09-08 ENCOUNTER — Ambulatory Visit (INDEPENDENT_AMBULATORY_CARE_PROVIDER_SITE_OTHER): Payer: Medicaid Other | Admitting: Psychiatry

## 2020-09-08 DIAGNOSIS — F411 Generalized anxiety disorder: Secondary | ICD-10-CM

## 2020-09-08 DIAGNOSIS — F319 Bipolar disorder, unspecified: Secondary | ICD-10-CM

## 2020-09-08 DIAGNOSIS — F331 Major depressive disorder, recurrent, moderate: Secondary | ICD-10-CM | POA: Diagnosis not present

## 2020-09-08 MED ORDER — BUSPIRONE HCL 15 MG PO TABS: 15.0000 mg | ORAL_TABLET | Freq: Three times a day (TID) | ORAL | 2 refills | Status: DC

## 2020-09-08 MED ORDER — HYDROXYZINE HCL 25 MG PO TABS: 25.0000 mg | ORAL_TABLET | Freq: Three times a day (TID) | ORAL | 2 refills | Status: DC

## 2020-09-08 MED ORDER — QUETIAPINE FUMARATE 25 MG PO TABS: 25.0000 mg | ORAL_TABLET | Freq: Every evening | ORAL | 2 refills | Status: DC | PRN

## 2020-09-08 MED ORDER — LAMOTRIGINE 25 MG PO TABS: 75.0000 mg | ORAL_TABLET | Freq: Every day | ORAL | 1 refills | Status: DC

## 2020-09-08 MED ORDER — GABAPENTIN 100 MG PO CAPS: 200.0000 mg | ORAL_CAPSULE | Freq: Two times a day (BID) | ORAL | 2 refills | Status: DC

## 2020-09-08 MED ORDER — GABAPENTIN 400 MG PO CAPS: 400.0000 mg | ORAL_CAPSULE | Freq: Every day | ORAL | 2 refills | Status: DC
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
## 2020-10-08 ENCOUNTER — Encounter (HOSPITAL_COMMUNITY): Payer: Medicaid Other | Admitting: Psychiatry

## 2020-10-08 ENCOUNTER — Telehealth (HOSPITAL_COMMUNITY): Payer: Self-pay | Admitting: *Deleted

## 2020-10-08 NOTE — Telephone Encounter (Signed)
PATIENT CALLED & STATED SHE IS HAVING ZERO COMPLICATIONS SINCE Increased- lamoTRIgine (LAMICTAL) 25 MG tablet; Take 3 tablets (75 mg total) by mouth daily. For mood stabilization. PATIENT STATED THAT IT WAS MENTIONED TO INCREASE TO Lamictal 100 MG TABLET  1 X DAILY IF DOING WELL ON PREVIOUS.

## 2020-10-09 ENCOUNTER — Ambulatory Visit (HOSPITAL_COMMUNITY): Payer: Medicaid Other | Admitting: Licensed Clinical Social Worker

## 2020-10-14 ENCOUNTER — Other Ambulatory Visit (HOSPITAL_COMMUNITY): Payer: Self-pay | Admitting: Psychiatry

## 2020-10-14 DIAGNOSIS — F331 Major depressive disorder, recurrent, moderate: Secondary | ICD-10-CM

## 2020-10-14 DIAGNOSIS — F319 Bipolar disorder, unspecified: Secondary | ICD-10-CM

## 2020-10-14 MED ORDER — LAMOTRIGINE 100 MG PO TABS: 100.0000 mg | ORAL_TABLET | Freq: Every day | ORAL | 2 refills | Status: DC

## 2020-10-14 NOTE — Telephone Encounter (Signed)
Patient reports that she is doing well on her current medications. She notes that her irritability has improved and notes that most of her irritability is circumstantial.  She notes that her mood is stable on Lamictal 100 mg daily however noted that she may not have refills. Provider refilled Lamictal 100 mg daily. No other concerns noted at this time.

## 2020-10-23 ENCOUNTER — Ambulatory Visit (HOSPITAL_COMMUNITY): Payer: Self-pay | Admitting: Licensed Clinical Social Worker

## 2020-10-23 ENCOUNTER — Ambulatory Visit (HOSPITAL_COMMUNITY): Payer: Medicaid Other | Admitting: Licensed Clinical Social Worker

## 2020-11-11 ENCOUNTER — Encounter (HOSPITAL_COMMUNITY): Payer: Medicaid Other | Admitting: Psychiatry

## 2020-11-13 ENCOUNTER — Telehealth (HOSPITAL_COMMUNITY): Payer: Medicaid Other | Admitting: Physician Assistant

## 2020-11-26 ENCOUNTER — Telehealth (HOSPITAL_COMMUNITY): Payer: Medicaid Other | Admitting: Physician Assistant

## 2020-11-26 ENCOUNTER — Other Ambulatory Visit: Payer: Self-pay

## 2020-12-12 ENCOUNTER — Ambulatory Visit (HOSPITAL_COMMUNITY): Payer: Medicaid Other | Admitting: Licensed Clinical Social Worker

## 2021-01-07 ENCOUNTER — Telehealth (HOSPITAL_COMMUNITY): Payer: Medicaid Other | Admitting: Physician Assistant

## 2021-02-16 ENCOUNTER — Emergency Department (HOSPITAL_COMMUNITY)
Admission: EM | Admit: 2021-02-16 | Discharge: 2021-02-16 | Disposition: A | Discharge status: Home / Self Care | Payer: Medicaid Other | Attending: Emergency Medicine | Admitting: Emergency Medicine

## 2021-02-16 DIAGNOSIS — O219 Vomiting of pregnancy, unspecified: Secondary | ICD-10-CM | POA: Diagnosis not present

## 2021-02-16 DIAGNOSIS — Z3A00 Weeks of gestation of pregnancy not specified: Secondary | ICD-10-CM | POA: Diagnosis not present

## 2021-02-16 DIAGNOSIS — R55 Syncope and collapse: Secondary | ICD-10-CM | POA: Diagnosis not present

## 2021-02-16 DIAGNOSIS — R42 Dizziness and giddiness: Secondary | ICD-10-CM | POA: Insufficient documentation

## 2021-02-16 DIAGNOSIS — O26899 Other specified pregnancy related conditions, unspecified trimester: Secondary | ICD-10-CM | POA: Insufficient documentation

## 2021-02-16 LAB — COMPREHENSIVE METABOLIC PANEL
ALT: 11 U/L (ref 0–44)
AST: 21 U/L (ref 15–41)
Albumin: 4.2 g/dL (ref 3.5–5.0)
Alkaline Phosphatase: 57 U/L (ref 38–126)
Anion gap: 7 (ref 5–15)
BUN: 10 mg/dL (ref 6–20)
CO2: 21 mmol/L — ABNORMAL LOW (ref 22–32)
Calcium: 8.7 mg/dL — ABNORMAL LOW (ref 8.9–10.3)
Chloride: 106 mmol/L (ref 98–111)
Creatinine, Ser: 0.62 mg/dL (ref 0.44–1.00)
GFR, Estimated: >60 mL/min (ref >60)
Glucose, Bld: 84 mg/dL (ref 70–99)
Potassium: 3.7 mmol/L (ref 3.5–5.1)
Sodium: 134 mmol/L — ABNORMAL LOW (ref 135–145)
Total Bilirubin: 0.6 mg/dL (ref 0.3–1.2)
Total Protein: 7.5 g/dL (ref 6.5–8.1)

## 2021-02-16 LAB — CBC WITH DIFFERENTIAL/PLATELET
Abs Immature Granulocytes: 0.03 10*3/uL (ref 0.00–0.07)
Basophils Absolute: 0.1 10*3/uL (ref 0.0–0.1)
Basophils Relative: 1 %
Eosinophils Absolute: 0.1 10*3/uL (ref 0.0–0.5)
Eosinophils Relative: 1 %
HCT: 39.7 % (ref 36.0–46.0)
Hemoglobin: 12.7 g/dL (ref 12.0–15.0)
Immature Granulocytes: 0 %
Lymphocytes Relative: 34 %
Lymphs Abs: 2.4 10*3/uL (ref 0.7–4.0)
MCH: 28.3 pg (ref 26.0–34.0)
MCHC: 32 g/dL (ref 30.0–36.0)
MCV: 88.6 fL (ref 80.0–100.0)
Monocytes Absolute: 0.7 10*3/uL (ref 0.1–1.0)
Monocytes Relative: 9 %
Neutro Abs: 3.9 10*3/uL (ref 1.7–7.7)
Neutrophils Relative %: 55 %
Platelets: 258 10*3/uL (ref 150–400)
RBC: 4.48 MIL/uL (ref 3.87–5.11)
RDW: 13.1 % (ref 11.5–15.5)
WBC: 7.2 10*3/uL (ref 4.0–10.5)
nRBC: 0 % (ref 0.0–0.2)

## 2021-02-16 LAB — URINALYSIS, ROUTINE W REFLEX MICROSCOPIC
Bilirubin Urine: NEGATIVE
Glucose, UA: NEGATIVE mg/dL
Hgb urine dipstick: NEGATIVE
Ketones, ur: 20 mg/dL — AB
Leukocytes,Ua: NEGATIVE
Nitrite: NEGATIVE
Protein, ur: NEGATIVE mg/dL
Specific Gravity, Urine: 1.025 (ref 1.005–1.030)
pH: 5 (ref 5.0–8.0)

## 2021-02-16 LAB — I-STAT BETA HCG BLOOD, ED (MC, WL, AP ONLY): I-stat hCG, quantitative: >2000 m[IU]/mL — ABNORMAL HIGH (ref <5)

## 2021-02-16 MED ORDER — METOCLOPRAMIDE HCL 10 MG PO TABS
10.0000 mg | ORAL_TABLET | Freq: Four times a day (QID) | ORAL | 0 refills | Status: DC | PRN
Start: 2021-02-16 — End: 2021-04-30

## 2021-02-16 NOTE — ED Triage Notes (Signed)
Pt. Stated, Tiffany Wong been lightheaded for a few days and yesterday I actually fainted. Ive also had some N/V.

## 2021-02-16 NOTE — Discharge Instructions (Signed)
Please read and follow all provided instructions.  Your diagnoses today include:  1. Nausea and vomiting in pregnancy   2. Syncope, unspecified syncope type     Tests performed today include: Blood cell counts (white, red, and platelets) Electrolytes  Kidney function test/liver function testing Urine test to check for infection Urine or blood test to check for pregnancy (in women) - positive for pregnancy  Vital signs. See below for your results today.   Medications prescribed:   Reglan -  medication for nausea vomiting  For nausea you may try half of a 25mg  doxylamine (Unisom) tablet twice a day and 25mg  of Vitamin B6, also called pyroxidine for nausea and vomiting in pregnancy. These medications are both inexpensive when bought over-the-counter.   Take any prescribed medications only as directed.  Home care instructions:  Follow any educational materials contained in this packet.  BE VERY CAREFUL not to take multiple medicines containing Tylenol (also called acetaminophen). Doing so can lead to an overdose which can damage your liver and cause liver failure and possibly death.   Follow-up instructions: Please follow-up with your primary care provider in the next 3 days for further evaluation of your symptoms.   Return instructions:   Please return to the Emergency Department if you experience worsening symptoms.   Return if you have chest pain, shortness of breath, additional episodes of passing out.  Please return if you have any other emergent concerns.  Additional Information:  Your vital signs today were: BP 100/64 (BP Location: Left Arm)   Pulse 75   Temp 98.1 F (36.7 C) (Oral)   Resp 15   LMP 02/10/2021   SpO2 100%  If your blood pressure (BP) was elevated above 135/85 this visit, please have this repeated by your doctor within one month. --------------

## 2021-02-16 NOTE — ED Provider Notes (Signed)
Emergency Medicine Provider Triage Evaluation Note  Tiffany Wong 30 y.o. female was evaluated in triage.  Pt complains of lightheadedness, syncope, nausea/vomiting.  She reports that over the last several days, she has felt like she was going to pass out.  Yesterday reports having syncopal episode.  Reports she has had some occasional nausea/vomiting.  She has been able to tolerate some p.o.  No chest pain, difficulty breathing, fevers.  LMP was 426/22.  Review of Systems  Positive: Lightheadedness, nausea/vomiting. Negative: Chest pain, shortness of breath, fever  Physical Exam  BP 134/82   Pulse 70   Temp 98.2 F (36.8 C) (Oral)   Resp 18   Ht 5\' 4"  (1.626 m)   Wt 65.8 kg   SpO2 100%   BMI 24.89 kg/m  Gen:   Awake, no distress   HEENT:  Atraumatic  Resp:  Normal effort  Cardiac:  Normal rate  Abd:   Nondistended, nontender  MSK:   Moves extremities without difficulty  Neuro:  Speech clear   Medical Decision Making  Medically screening exam initiated at 3:55 AM.  Appropriate orders placed.  Tiffany Wong was informed that the remainder of the evaluation will be completed by another provider, this initial triage assessment does not replace that evaluation, and the importance of remaining in the ED until their evaluation is complete.  Clinical Impression  lightheadedness   Portions of this note were generated with Dragon dictation software. Dictation errors may occur despite best attempts at proofreading.     Durene Cal, PA-C 02/16/21 [id number]    04/18/21, DO 02/17/21 [id number]

## 2021-03-12 ENCOUNTER — Ambulatory Visit (INDEPENDENT_AMBULATORY_CARE_PROVIDER_SITE_OTHER): Payer: Medicaid Other

## 2021-03-12 ENCOUNTER — Other Ambulatory Visit: Payer: Self-pay

## 2021-03-12 VITALS — BP 109/77 | HR 81 | Ht 61.0 in | Wt 99.5 lb

## 2021-03-12 DIAGNOSIS — F411 Generalized anxiety disorder: Secondary | ICD-10-CM

## 2021-03-12 DIAGNOSIS — Z3491 Encounter for supervision of normal pregnancy, unspecified, first trimester: Secondary | ICD-10-CM | POA: Insufficient documentation

## 2021-03-12 DIAGNOSIS — K5900 Constipation, unspecified: Secondary | ICD-10-CM

## 2021-03-12 DIAGNOSIS — O3680X0 Pregnancy with inconclusive fetal viability, not applicable or unspecified: Secondary | ICD-10-CM | POA: Diagnosis not present

## 2021-03-12 DIAGNOSIS — Z3481 Encounter for supervision of other normal pregnancy, first trimester: Secondary | ICD-10-CM | POA: Diagnosis not present

## 2021-03-12 DIAGNOSIS — F331 Major depressive disorder, recurrent, moderate: Secondary | ICD-10-CM

## 2021-03-12 MED ORDER — DOCUSATE SODIUM 100 MG PO CAPS: 100.0000 mg | ORAL_CAPSULE | Freq: Two times a day (BID) | ORAL | 2 refills | Status: DC | PRN

## 2021-03-12 MED ORDER — BLOOD PRESSURE KIT DEVI
1.0000 | 0 refills | Status: DC
Start: 2021-03-12 — End: 2023-03-11

## 2021-03-12 MED ORDER — VITAFOL ULTRA 29-0.6-0.4-200 MG PO CAPS: 1.0000 | ORAL_CAPSULE | Freq: Every day | ORAL | 11 refills | Status: DC

## 2021-03-12 NOTE — Progress Notes (Addendum)
New OB Intake  I connected with  Tiffany Wong on 03/12/21 at  3:15 PM EDT by in office and verified that I am speaking with the correct person using two identifiers. Nurse is located at [facility] and pt is located at In office.  I discussed the limitations, risks, security and privacy concerns of performing an evaluation and management service by telephone and the availability of in person appointments. I also discussed with the patient that there may be a patient responsible charge related to this service. The patient expressed understanding and agreed to proceed.  I explained I am completing New OB Intake today. We discussed her EDD of 10/17/21 that is based on LMP of 01/10/21. Pt is G5/P2. I reviewed her allergies, medications, Medical/Surgical/OB history, and appropriate screenings. I informed her of [facility] services. Based on history, this is a/an complicated by Bipolar Disorder and Depression pregnancy.  Patient Active Problem List   Diagnosis Date Noted  . Encounter for supervision of normal pregnancy in first trimester 03/12/2021  . Moderate episode of recurrent major depressive disorder (HCC) 06/30/2020  . Generalized anxiety disorder 06/30/2020  . MDD (major depressive disorder), recurrent severe, 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  . Elevated LFTs   . Coagulopathy (HCC)   . Mucus in stool   . Suicidal behavior 06/03/2020  . Intentional acetaminophen overdose (HCC) 06/02/2020  . Polysubstance abuse (HCC) 06/02/2020  . Hypokalemia 06/02/2020  . Agitation 06/02/2020  . Bipolar 1 disorder (HCC) 02/05/2020  . ASCUS with positive high risk HPV cervical 09/14/2018  . Supervision of other normal pregnancy, antepartum 09/05/2018  . Borderline personality disorder (HCC) 10/29/2013  . Tobacco abuse 01/02/2012  . 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 Bipolar affective disorder (HCC) 05/17/2011    Concerns addressed today  Delivery Plans:  Plans to deliver at [facility].    MyChart/Babyscripts MyChart access verified. I explained pt will have some visits in office and some virtually. Babyscripts instructions given and order placed. Patient verifies receipt of registration text/e-mail. Account successfully created and app downloaded.  Blood Pressure Cuff Blood pressure cuff ordered for patient to pick-up from [employer]. Explained after first prenatal appt pt will check weekly and document in Babyscripts.  Anatomy US Explained first scheduled [place] will be around 19 weeks. Dating and viability scan performed today  Labs Discussed Avelina Laine genetic screening with patient. Would like both Panorama and Horizon drawn at new OB visit. Routine prenatal labs needed.  Covid Vaccine Patient has covid vaccine.   Social Determinants of Health . Food Insecurity: Patient expresses food insecurity. Food Market information given to patient; explained patient may visit at the end of first OB appointment. . WIC Referral: Patient is interested in referral to [facility].  . Transportation: Patient expressed transportation needs. Transportation Services reviewed with patient; patient registered and phone number provided for patient to schedule rides. . Childcare: Discussed no children allowed at ultrasound appointments. Offered childcare services; patient declines childcare services at this time.  First visit review I reviewed new OB appt with pt. I explained she will have a pelvic exam, ob bloodwork with genetic screening, and PAP smear. Explained pt will be seen by Gerrit Heck at first visit; encounter routed to appropriate provider. Explained that patient will be seen by pregnancy navigator following visit with provider.  Hamilton Capri, RN 03/12/2021  4:04 PM   Patient was assessed and managed by nursing staff during this encounter. I have reviewed the chart and agree with the documentation and plan. I  have also made any necessary editorial changes.  Coral Ceo,  MD 03/13/2021 8:37 AM

## 2021-03-18 ENCOUNTER — Encounter: Payer: Medicaid Other | Admitting: Licensed Clinical Social Worker

## 2021-03-19 ENCOUNTER — Other Ambulatory Visit: Payer: Self-pay

## 2021-03-19 ENCOUNTER — Ambulatory Visit (HOSPITAL_COMMUNITY)
Admission: EM | Admit: 2021-03-19 | Discharge: 2021-03-19 | Disposition: A | Discharge status: Home / Self Care | Payer: Medicaid Other | Attending: Nurse Practitioner | Admitting: Nurse Practitioner

## 2021-03-19 DIAGNOSIS — F3130 Bipolar disorder, current episode depressed, mild or moderate severity, unspecified: Secondary | ICD-10-CM

## 2021-03-19 DIAGNOSIS — Z9151 Personal history of suicidal behavior: Secondary | ICD-10-CM | POA: Insufficient documentation

## 2021-03-19 DIAGNOSIS — F319 Bipolar disorder, unspecified: Secondary | ICD-10-CM | POA: Insufficient documentation
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
## 2021-03-19 NOTE — ED Notes (Signed)
[employer] called to transport pt home 
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
## 2021-03-19 NOTE — ED Notes (Signed)
Pt requesting to leave now, stating she is tired of waiting and that she has a personal buss pass to get home. Call to [employer] to cancel transport.

## 2021-03-19 NOTE — Discharge Instructions (Addendum)
Follow up with [facility] or return to [facility] during walk-in hours  Discharge recommendations:  Patient is to take medications as prescribed. Please see information for follow-up appointment with psychiatry and therapy. Please follow up with your primary care provider for all medical related needs.   Therapy: We recommend that patient participate in individual therapy to address mental health concerns.  Atypical antipsychotics: If you are prescribed an atypical antipsychotic, it is recommended that your height, weight, BMI, blood pressure, fasting lipid panel, and fasting blood sugar be monitored by your outpatient providers.  Safety:  The patient should abstain from use of illicit substances/drugs and abuse of any medications. If symptoms worsen or do not continue to improve or if the patient becomes actively suicidal or homicidal then it is recommended that the patient return to the closest hospital emergency department, the [facility], or call 911 for further evaluation and treatment. National Suicide Prevention Lifeline 1-800-SUICIDE or (479) 229-3135.

## 2021-03-19 NOTE — ED Notes (Signed)
Pt discharged in no acute distress. A&O x4, ambulatory. Verbalized understanding of AVS instructions reviewed by RN. Belongings returned to pt intact from "blue" locker. Pt escorted to front lobby by staff. Safety maintained.

## 2021-03-26 ENCOUNTER — Ambulatory Visit (INDEPENDENT_AMBULATORY_CARE_PROVIDER_SITE_OTHER): Payer: Medicaid Other

## 2021-03-26 ENCOUNTER — Other Ambulatory Visit (HOSPITAL_COMMUNITY)
Admission: RE | Admit: 2021-03-26 | Discharge: 2021-03-26 | Disposition: A | Discharge status: Home / Self Care | Payer: Medicaid Other | Source: Ambulatory Visit

## 2021-03-26 ENCOUNTER — Other Ambulatory Visit: Payer: Self-pay

## 2021-03-26 VITALS — BP 107/65 | HR 109 | Wt 97.4 lb

## 2021-03-26 DIAGNOSIS — Z3491 Encounter for supervision of normal pregnancy, unspecified, first trimester: Secondary | ICD-10-CM

## 2021-03-26 DIAGNOSIS — F331 Major depressive disorder, recurrent, moderate: Secondary | ICD-10-CM

## 2021-03-26 DIAGNOSIS — Z124 Encounter for screening for malignant neoplasm of cervix: Secondary | ICD-10-CM | POA: Insufficient documentation

## 2021-03-26 DIAGNOSIS — Z3A10 10 weeks gestation of pregnancy: Secondary | ICD-10-CM

## 2021-03-26 DIAGNOSIS — F411 Generalized anxiety disorder: Secondary | ICD-10-CM
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
## 2021-03-26 NOTE — Progress Notes (Signed)
NOB 10.5 wks Reports low abdomen discomfort. Has taken Ibuprofen. Counseled that ibuprofen is not advised in pregnancy. Depression and anxiety screen positive/high -Patient already has appt scheduled with counselor.

## 2021-03-27 LAB — OBSTETRIC PANEL, INCLUDING HIV
Antibody Screen: NEGATIVE
Basophils Absolute: 0 10*3/uL (ref 0.0–0.2)
Basos: 1 %
EOS (ABSOLUTE): 0.1 10*3/uL (ref 0.0–0.4)
Eos: 2 %
HIV Screen 4th Generation wRfx: NONREACTIVE
Hematocrit: 38.1 % (ref 34.0–46.6)
Hemoglobin: 12.8 g/dL (ref 11.1–15.9)
Hepatitis B Surface Ag: NEGATIVE
Immature Grans (Abs): 0 10*3/uL (ref 0.0–0.1)
Immature Granulocytes: 0 %
Lymphocytes Absolute: 1.3 10*3/uL (ref 0.7–3.1)
Lymphs: 25 %
MCH: 29.4 pg (ref 26.6–33.0)
MCHC: 33.6 g/dL (ref 31.5–35.7)
MCV: 87 fL (ref 79–97)
Monocytes Absolute: 0.7 10*3/uL (ref 0.1–0.9)
Monocytes: 13 %
Neutrophils Absolute: 3 10*3/uL (ref 1.4–7.0)
Neutrophils: 59 %
Platelets: 262 10*3/uL (ref 150–450)
RBC: 4.36 x10E6/uL (ref 3.77–5.28)
RDW: 13.1 % (ref 11.7–15.4)
RPR Ser Ql: NONREACTIVE
Rh Factor: POSITIVE
Rubella Antibodies, IGG: 7.46 index (ref >0.99)
WBC: 5 10*3/uL (ref 3.4–10.8)

## 2021-03-27 LAB — CERVICOVAGINAL ANCILLARY ONLY
Chlamydia: NEGATIVE
Comment: NEGATIVE
Comment: NEGATIVE
Comment: NORMAL
Neisseria Gonorrhea: NEGATIVE
Trichomonas: NEGATIVE

## 2021-03-29 LAB — URINE CULTURE, OB REFLEX: Organism ID, Bacteria: NO GROWTH

## 2021-03-29 LAB — CULTURE, OB URINE

## 2021-03-30 LAB — CYTOLOGY - PAP
Comment: NEGATIVE
Diagnosis: NEGATIVE
High risk HPV: NEGATIVE

## 2021-04-01 ENCOUNTER — Other Ambulatory Visit: Payer: Self-pay

## 2021-04-01 ENCOUNTER — Ambulatory Visit (INDEPENDENT_AMBULATORY_CARE_PROVIDER_SITE_OTHER): Payer: Medicaid Other | Admitting: Licensed Clinical Social Worker

## 2021-04-01 DIAGNOSIS — Z59811 Housing instability, housed, with risk of homelessness: Secondary | ICD-10-CM | POA: Diagnosis not present

## 2021-04-01 DIAGNOSIS — F332 Major depressive disorder, recurrent severe without psychotic features: Secondary | ICD-10-CM

## 2021-04-06 NOTE — BH Specialist Note (Signed)
Integrated Behavioral Health via Telemedicine Visit  04/06/2021 Tiffany Wong [id number]  Number of Integrated Behavioral Health visits: 1/6 Session Start time: 2:00pm  Session End time: 2:46pm Total time: 46 mins via mychart video   Referring Provider: Sabas Sous  Patient/Family location: Home  [facility] Provider location: [place]  All persons participating in visit: Tiffany Wong and LCSWA A. Felton Clinton  Types of Service: General Behavioral Integrated Care (BHI)  I connected with Tiffany Wong and/or Tiffany Wong's n/a via  Telephone or Video Enabled Telemedicine Application  (Video is Caregility application) and verified that I am speaking with the correct person using two identifiers. Discussed confidentiality: Yes   I discussed the limitations of telemedicine and the availability of in person appointments.  Discussed there is a possibility of technology failure and discussed alternative modes of communication if that failure occurs.  I discussed that engaging in this telemedicine visit, they consent to the provision of behavioral healthcare and the services will be billed under their insurance.  Patient and/or legal guardian expressed understanding and consented to Telemedicine visit: Yes   Presenting Concerns: Patient and/or family reports the following symptoms/concerns: Homeless, stress, history of depression and bipolar  Duration of problem: approx 2 years ; Severity of problem: moderate  Patient and/or Family's Strengths/Protective Factors: Sense of purpose  Goals Addressed: Patient will:  Reduce symptoms of: depression, mood instability, and stress   Increase knowledge and/or ability of: healthy habits, self-management skills, and stress reduction   Demonstrate ability to: Increase adequate support systems for patient/family  Progress towards Goals: Ongoing  Interventions: Interventions utilized:  Solution-Focused Strategies Standardized Assessments completed: PHQ  9   Assessment: Patient currently experiencing depression affecting pregnancy and unstable living conditions.   Patient may benefit from integrated behavioral health. Housing resources provided. Tiffany Wong reports [facility]   Plan: Follow up with behavioral health clinician on : 3 weeks via mychart Behavioral recommendations: Contact apt resources given during appt, take prescribed medication as directed Referral(s): Integrated [employer] (In Clinic)  I discussed the assessment and treatment plan with the patient and/or parent/guardian. They were provided an opportunity to ask questions and all were answered. They agreed with the plan and demonstrated an understanding of the instructions.   They were advised to call back or seek an in-person evaluation if the symptoms worsen or if the condition fails to improve as anticipated.  Gwyndolyn Saxon, LCSW

## 2021-04-07 ENCOUNTER — Encounter: Payer: Self-pay | Admitting: Obstetrics

## 2021-04-27 NOTE — Telephone Encounter (Signed)
Please schedule a follow-up

## 2021-04-30 ENCOUNTER — Ambulatory Visit (INDEPENDENT_AMBULATORY_CARE_PROVIDER_SITE_OTHER): Payer: Medicaid Other | Admitting: Obstetrics and Gynecology

## 2021-04-30 ENCOUNTER — Other Ambulatory Visit: Payer: Self-pay

## 2021-04-30 ENCOUNTER — Encounter: Payer: Self-pay | Admitting: Obstetrics and Gynecology

## 2021-04-30 VITALS — BP 105/70 | HR 108 | Wt 106.4 lb

## 2021-04-30 DIAGNOSIS — Z3481 Encounter for supervision of other normal pregnancy, first trimester: Secondary | ICD-10-CM

## 2021-04-30 MED ORDER — ONDANSETRON 4 MG PO TBDP: 4.0000 mg | ORAL_TABLET | Freq: Three times a day (TID) | ORAL | 0 refills | Status: DC | PRN

## 2021-04-30 MED ORDER — METOCLOPRAMIDE HCL 10 MG PO TABS: 10.0000 mg | ORAL_TABLET | Freq: Four times a day (QID) | ORAL | 2 refills | Status: DC | PRN

## 2021-04-30 MED ORDER — VALACYCLOVIR HCL 500 MG PO TABS: 500.0000 mg | ORAL_TABLET | Freq: Two times a day (BID) | ORAL | 6 refills | Status: DC

## 2021-04-30 MED ORDER — PROMETHAZINE HCL 25 MG PO TABS: 25.0000 mg | ORAL_TABLET | Freq: Four times a day (QID) | ORAL | 1 refills | Status: DC | PRN
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
## 2021-04-30 NOTE — Progress Notes (Signed)
Subjective:  Tiffany Wong is a 30 y.o. A6T0160 at [redacted]w[redacted]d being seen today for ongoing prenatal care.  She is currently monitored for the following issues for this low-risk pregnancy and has Bipolar affective disorder (HCC); HSV-2 infection; Tobacco abuse; Borderline personality disorder (HCC); Supervision of other normal pregnancy, antepartum; ASCUS with positive high risk HPV cervical; Bipolar 1 disorder (HCC); Intentional acetaminophen overdose (HCC); Polysubstance abuse (HCC); Agitation; Suicidal behavior; Coagulopathy (HCC); MDD (major depressive disorder), recurrent severe, without psychosis (HCC); Moderate episode of recurrent major depressive disorder (HCC); Generalized anxiety disorder; and Encounter for supervision of normal pregnancy in first trimester on their problem list.  Patient reports general discomforts of pregnancy.  Contractions: Irritability. Vag. Bleeding: None.  Movement: Present. Denies leaking of fluid.   The following portions of the patient's history were reviewed and updated as appropriate: allergies, current medications, past family history, past medical history, past social history, past surgical history and problem list. Problem list updated.  Objective:   Vitals:   04/30/21 1307  BP: 105/70  Pulse: (!) 108  Weight: 106 lb 6.4 oz (48.3 kg)    Fetal Status: Fetal Heart Rate (bpm): 144   Movement: Present     General:  Alert, oriented and cooperative. Patient is in no acute distress.  Skin: Skin is warm and dry. No rash noted.   Cardiovascular: Normal heart rate noted  Respiratory: Normal respiratory effort, no problems with respiration noted  Abdomen: Soft, gravid, appropriate for gestational age. Pain/Pressure: Absent     Pelvic:  Cervical exam deferred        Extremities: Normal range of motion.  Edema: None  Mental Status: Normal mood and affect. Normal behavior. Normal judgment and thought content.   Urinalysis:      Assessment and Plan:  Pregnancy:  [id number] at [redacted]w[redacted]d  1. Encounter for supervision of other normal pregnancy in first trimester Stable - AFP, Serum, Open Spina Bifida - metoCLOPramide (REGLAN) 10 MG tablet; Take 1 tablet (10 mg total) by mouth 4 (four) times daily as needed for nausea or vomiting.  Dispense: 30 tablet; Refill: 2 - ondansetron (ZOFRAN ODT) 4 MG disintegrating tablet; Take 1 tablet (4 mg total) by mouth every 8 (eight) hours as needed for nausea or vomiting.  Dispense: 20 tablet; Refill: 0 - promethazine (PHENERGAN) 25 MG tablet; Take 1 tablet (25 mg total) by mouth every 6 (six) hours as needed for nausea or vomiting.  Dispense: 30 tablet; Refill: 1 - [place] MFM OB COMP + 14 WK; Future  Preterm labor symptoms and general obstetric precautions including but not limited to vaginal bleeding, contractions, leaking of fluid and fetal movement were reviewed in detail with the patient. Please refer to After Visit Summary for other counseling recommendations.  Return in about 4 weeks (around 05/28/2021) for OB visit, face to face, any provider.   Hermina Staggers, MD

## 2021-04-30 NOTE — Patient Instructions (Signed)

## 2021-05-02 LAB — AFP, SERUM, OPEN SPINA BIFIDA
AFP MoM: 1.14
AFP Value: 51.8 ng/mL
Gest. Age on Collection Date: 15.5 weeks
Maternal Age At EDD: 31.8 yr
OSBR Risk 1 IN: 10000
Test Results:: NEGATIVE
Weight: 106 [lb_av]

## 2021-05-13 ENCOUNTER — Other Ambulatory Visit: Payer: Self-pay | Admitting: Obstetrics and Gynecology

## 2021-05-13 ENCOUNTER — Other Ambulatory Visit: Payer: Self-pay

## 2021-05-13 DIAGNOSIS — F17209 Nicotine dependence, unspecified, with unspecified nicotine-induced disorders: Secondary | ICD-10-CM

## 2021-05-13 MED ORDER — NICOTINE 7 MG/24HR TD PT24: 7.0000 mg | MEDICATED_PATCH | Freq: Every day | TRANSDERMAL | 1 refills | Status: DC

## 2021-06-01 ENCOUNTER — Ambulatory Visit: Payer: Medicaid Other

## 2021-06-02 ENCOUNTER — Ambulatory Visit (INDEPENDENT_AMBULATORY_CARE_PROVIDER_SITE_OTHER): Payer: Medicaid Other | Admitting: Licensed Clinical Social Worker

## 2021-06-02 DIAGNOSIS — O99340 Other mental disorders complicating pregnancy, unspecified trimester: Secondary | ICD-10-CM | POA: Diagnosis not present

## 2021-06-02 DIAGNOSIS — F32A Depression, unspecified: Secondary | ICD-10-CM

## 2021-06-02 DIAGNOSIS — Z3A00 Weeks of gestation of pregnancy not specified: Secondary | ICD-10-CM

## 2021-06-02 DIAGNOSIS — Z59811 Housing instability, housed, with risk of homelessness: Secondary | ICD-10-CM | POA: Diagnosis not present
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
## 2021-07-17 ENCOUNTER — Other Ambulatory Visit (HOSPITAL_COMMUNITY)
Admission: RE | Admit: 2021-07-17 | Discharge: 2021-07-17 | Disposition: A | Discharge status: Home / Self Care | Payer: Medicaid Other | Source: Ambulatory Visit

## 2021-07-17 ENCOUNTER — Ambulatory Visit (INDEPENDENT_AMBULATORY_CARE_PROVIDER_SITE_OTHER): Payer: Medicaid Other | Admitting: Obstetrics

## 2021-07-17 ENCOUNTER — Encounter: Payer: Self-pay | Admitting: Obstetrics

## 2021-07-17 ENCOUNTER — Other Ambulatory Visit: Payer: Self-pay

## 2021-07-17 VITALS — BP 99/58 | HR 77 | Wt 118.0 lb

## 2021-07-17 DIAGNOSIS — O99340 Other mental disorders complicating pregnancy, unspecified trimester: Secondary | ICD-10-CM

## 2021-07-17 DIAGNOSIS — N898 Other specified noninflammatory disorders of vagina: Secondary | ICD-10-CM | POA: Insufficient documentation

## 2021-07-17 DIAGNOSIS — O26893 Other specified pregnancy related conditions, third trimester: Secondary | ICD-10-CM

## 2021-07-17 DIAGNOSIS — Z3480 Encounter for supervision of other normal pregnancy, unspecified trimester: Secondary | ICD-10-CM

## 2021-07-17 DIAGNOSIS — F32A Depression, unspecified: Secondary | ICD-10-CM

## 2021-07-17 DIAGNOSIS — K5900 Constipation, unspecified: Secondary | ICD-10-CM

## 2021-07-17 DIAGNOSIS — Z9119 Patient's noncompliance with other medical treatment and regimen: Secondary | ICD-10-CM

## 2021-07-17 DIAGNOSIS — Z91199 Patient's noncompliance with other medical treatment and regimen due to unspecified reason: Secondary | ICD-10-CM

## 2021-07-17 DIAGNOSIS — O09893 Supervision of other high risk pregnancies, third trimester: Secondary | ICD-10-CM

## 2021-07-17 MED ORDER — DOCUSATE SODIUM 100 MG PO CAPS: 100.0000 mg | ORAL_CAPSULE | Freq: Two times a day (BID) | ORAL | 5 refills | Status: DC

## 2021-07-17 MED ORDER — METRONIDAZOLE 500 MG PO TABS: 500.0000 mg | ORAL_TABLET | Freq: Two times a day (BID) | ORAL | 2 refills | Status: DC

## 2021-07-17 NOTE — Progress Notes (Signed)
Pt states she had a couple episodes when she has noticed visual problems, pt notes she has had HA and some dizziness with it.  Pt states she is having some lower back pain and pelvic cramping.

## 2021-07-17 NOTE — Progress Notes (Signed)
Subjective:  Tiffany Wong is a 30 y.o. [id number] at [redacted]w[redacted]d being seen today for ongoing prenatal care.  She is currently monitored for the following issues for this low-risk pregnancy and has Bipolar affective disorder (HCC); HSV-2 infection; Tobacco abuse; Borderline personality disorder (HCC); Supervision of other normal pregnancy, antepartum; ASCUS with positive high risk HPV cervical; Bipolar 1 disorder (HCC); Intentional acetaminophen overdose (HCC); Polysubstance abuse (HCC); Agitation; Suicidal behavior; Coagulopathy (HCC); MDD (major depressive disorder), recurrent severe, without psychosis (HCC); Moderate episode of recurrent major depressive disorder (HCC); Generalized anxiety disorder; and Encounter for supervision of normal pregnancy in first trimester on their problem list.  Patient reports backache and occasional contractions.  Contractions: Irritability. Vag. Bleeding: None.  Movement: Present. Denies leaking of fluid.   The following portions of the patient's history were reviewed and updated as appropriate: allergies, current medications, past family history, past medical history, past social history, past surgical history and problem list. Problem list updated.  Objective:   Vitals:   07/17/21 1058  BP: (!) 99/58  Pulse: 77  Weight: 118 lb (53.5 kg)    Fetal Status:     Movement: Present     General:  Alert, oriented and cooperative. Patient is in no acute distress.  Skin: Skin is warm and dry. No rash noted.   Cardiovascular: Normal heart rate noted  Respiratory: Normal respiratory effort, no problems with respiration noted  Abdomen: Soft, gravid, appropriate for gestational age. Pain/Pressure: Present     Pelvic:  Cervical exam performed      Cvx:  Long / closed / firm / presenting part ballotable  Extremities: Normal range of motion.     Mental Status: Normal mood and affect. Normal behavior. Normal judgment and thought content.   Urinalysis:      Assessment and Plan:   Pregnancy: [id number] at [redacted]w[redacted]d  1. Supervision of other normal pregnancy, antepartum  2. Noncompliant pregnant patient in third trimester  3. Vaginal discharge during pregnancy in third trimester Rx: - Cervicovaginal ancillary only( [place]) - metroNIDAZOLE (FLAGYL) 500 MG tablet; Take 1 tablet (500 mg total) by mouth 2 (two) times daily.  Dispense: 14 tablet; Refill: 2  4. Constipation, unspecified constipation type Rx: - docusate sodium (COLACE) 100 MG capsule; Take 1 capsule (100 mg total) by mouth 2 (two) times daily.  Dispense: 30 capsule; Refill: 5  5. Depression affecting pregnancy Rx: - Ambulatory referral to Integrated Behavioral Health    Preterm labor symptoms and general obstetric precautions including but not limited to vaginal bleeding, contractions, leaking of fluid and fetal movement were reviewed in detail with the patient. Please refer to After Visit Summary for other counseling recommendations.   Return in about 2 weeks (around 07/31/2021) for ROB, 2 hour OGTT.   Brock Bad, MD  07/17/21

## 2021-07-20 ENCOUNTER — Ambulatory Visit: Payer: Medicaid Other | Attending: Obstetrics and Gynecology

## 2021-07-20 ENCOUNTER — Other Ambulatory Visit: Payer: Self-pay

## 2021-07-20 LAB — CERVICOVAGINAL ANCILLARY ONLY
Bacterial Vaginitis (gardnerella): NEGATIVE
Candida Glabrata: NEGATIVE
Candida Vaginitis: NEGATIVE
Chlamydia: NEGATIVE
Comment: NEGATIVE
Comment: NEGATIVE
Comment: NEGATIVE
Comment: NEGATIVE
Comment: NEGATIVE
Comment: NORMAL
Neisseria Gonorrhea: NEGATIVE
Trichomonas: NEGATIVE

## 2021-07-31 ENCOUNTER — Other Ambulatory Visit: Payer: Medicaid Other

## 2021-07-31 ENCOUNTER — Encounter: Payer: Medicaid Other | Admitting: Nurse Practitioner

## 2021-07-31 DIAGNOSIS — Z3481 Encounter for supervision of other normal pregnancy, first trimester: Secondary | ICD-10-CM

## 2021-10-18 HISTORY — PX: GALLBLADDER SURGERY: SHX652

## 2021-11-16 DIAGNOSIS — K819 Cholecystitis, unspecified: Secondary | ICD-10-CM | POA: Insufficient documentation

## 2022-05-01 ENCOUNTER — Emergency Department (HOSPITAL_COMMUNITY)
Admission: EM | Admit: 2022-05-01 | Discharge: 2022-05-02 | Discharge status: Against Medical Advice | Payer: Medicaid Other

## 2022-05-02 ENCOUNTER — Ambulatory Visit (HOSPITAL_COMMUNITY)
Admission: EM | Admit: 2022-05-02 | Discharge: 2022-05-02 | Disposition: A | Discharge status: Home / Self Care | Payer: Medicaid Other | Attending: Emergency Medicine | Admitting: Emergency Medicine

## 2022-05-02 ENCOUNTER — Encounter (HOSPITAL_COMMUNITY): Payer: Self-pay

## 2022-05-02 DIAGNOSIS — K047 Periapical abscess without sinus: Secondary | ICD-10-CM | POA: Diagnosis not present

## 2022-05-02 DIAGNOSIS — K0889 Other specified disorders of teeth and supporting structures: Secondary | ICD-10-CM | POA: Diagnosis not present

## 2022-05-02 MED ORDER — AMOXICILLIN-POT CLAVULANATE 875-125 MG PO TABS: 1.0000 | ORAL_TABLET | Freq: Two times a day (BID) | ORAL | 0 refills | Status: AC

## 2022-05-02 NOTE — Discharge Instructions (Addendum)
Take the antibiotic twice daily for 7 days.  Continue ibuprofen every 6 hours for pain.  Both of these medicines are safe in breast feeding.  Please follow up with your dentist as soon as able.  Please go to the emergency department if symptoms worsen.

## 2022-05-02 NOTE — ED Notes (Signed)
NA for triage x3. Moving pt OTF

## 2022-05-02 NOTE — ED Triage Notes (Signed)
Patient presents to Urgent Care with complaints of dental pain on L side since last  night. Patient reports she has had a hole in a tooth on the top right side before but she hasn't been in pain until last night. Pt reports pain is worse when she eats. Pt reports motrin for pain today. Pt st she attempted to go to er yesterday but wait was too long and she had to get home to her baby
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
## 2022-05-11 ENCOUNTER — Emergency Department (HOSPITAL_COMMUNITY): Payer: Medicaid Other

## 2022-05-11 ENCOUNTER — Emergency Department (HOSPITAL_COMMUNITY)
Admission: EM | Admit: 2022-05-11 | Discharge: 2022-05-11 | Disposition: A | Discharge status: Home / Self Care | Payer: Medicaid Other | Attending: Emergency Medicine | Admitting: Emergency Medicine

## 2022-05-11 ENCOUNTER — Encounter (HOSPITAL_COMMUNITY): Payer: Self-pay

## 2022-05-11 ENCOUNTER — Other Ambulatory Visit: Payer: Self-pay

## 2022-05-11 DIAGNOSIS — S61411A Laceration without foreign body of right hand, initial encounter: Secondary | ICD-10-CM | POA: Diagnosis not present

## 2022-05-11 DIAGNOSIS — W25XXXA Contact with sharp glass, initial encounter: Secondary | ICD-10-CM | POA: Diagnosis not present

## 2022-05-11 DIAGNOSIS — S6991XA Unspecified injury of right wrist, hand and finger(s), initial encounter: Secondary | ICD-10-CM | POA: Diagnosis present

## 2022-05-11 MED ORDER — LIDOCAINE HCL (PF) 1 % IJ SOLN
30.0000 mL | Freq: Once | INTRAMUSCULAR | Status: AC
  Administered 2022-05-11: 30 mL
  Filled 2022-05-11: qty 30

## 2022-05-11 NOTE — ED Provider Notes (Signed)
[facility] EMERGENCY DEPARTMENT Provider Note   CSN: [id number] Arrival date & time: 05/11/22  1239     History  Chief Complaint  Patient presents with   Laceration    Tiffany Wong is a 31 y.o. female.  Pt complains of a laceration to her left hand from a broken glass lamp.  Pt complains of some numbness   The history is provided by the patient. No language interpreter was used.  Laceration Location:  Hand Hand laceration location:  R hand Length:  2 Depth:  Through dermis Quality: straight   Bleeding: controlled   Time since incident:  2 hours Laceration mechanism:  Broken glass Pain details:    Quality:  Aching   Severity:  No pain   Progression:  Worsening Foreign body present:  No foreign bodies Relieved by:  Nothing Worsened by:  Nothing Ineffective treatments:  None tried Tetanus status:  Up to date Associated symptoms: no fever and no swelling        Home Medications Prior to Admission medications   Medication Sig Start Date End Date Taking? Authorizing Provider  acetaminophen (TYLENOL) 500 MG tablet Take 500 mg by mouth every 6 (six) hours as needed.    [provider]  Blood Pressure Monitoring (BLOOD PRESSURE KIT) DEVI 1 kit by Does not apply route once a week. 03/12/21   Shelly Bombard, MD  docusate sodium (COLACE) 100 MG capsule Take 1 capsule (100 mg total) by mouth 2 (two) times daily. 07/17/21   Shelly Bombard, MD  metoCLOPramide (REGLAN) 10 MG tablet Take 1 tablet (10 mg total) by mouth 4 (four) times daily as needed for nausea or vomiting. 04/30/21   Chancy Milroy, MD  nicotine (NICODERM CQ) 7 mg/24hr patch Place 1 patch (7 mg total) onto the skin daily. 05/13/21   Chancy Milroy, MD  ondansetron (ZOFRAN ODT) 4 MG disintegrating tablet Take 1 tablet (4 mg total) by mouth every 8 (eight) hours as needed for nausea or vomiting. 04/30/21   Chancy Milroy, MD  Prenat-Fe Poly-Methfol-FA-DHA (VITAFOL ULTRA)  29-0.6-0.4-200 MG CAPS Take 1 capsule by mouth daily. 03/12/21   Shelly Bombard, MD  promethazine (PHENERGAN) 25 MG tablet Take 1 tablet (25 mg total) by mouth every 6 (six) hours as needed for nausea or vomiting. 04/30/21   Chancy Milroy, MD  valACYclovir (VALTREX) 500 MG tablet Take 1 tablet (500 mg total) by mouth 2 (two) times daily. 04/30/21   Chancy Milroy, MD  metoCLOPramide (REGLAN) 10 MG tablet Take 1 tablet (10 mg total) by mouth every 6 (six) hours as needed for refractory nausea / vomiting. Patient not taking: No sig reported 02/16/21   Carlisle Cater, PA-C      Allergies    Aspirin    Review of Systems   Review of Systems  Constitutional:  Negative for fever.  Skin:  Positive for wound.  All other systems reviewed and are negative.   Physical Exam Updated Vital Signs BP 90/79 (BP Location: Left Arm)   Pulse 79   Temp 97.8 F (36.6 C) (Oral)   Resp 12   Ht '5\' 1"'  (1.549 m)   Wt 46.7 kg   LMP 05/08/2022 (Approximate)   SpO2 100%   BMI 19.46 kg/m  Physical Exam Vitals reviewed.  Constitutional:      Appearance: Normal appearance.  Cardiovascular:     Rate and Rhythm: Normal rate.  Pulmonary:     Effort: Pulmonary effort  is normal.  Musculoskeletal:        General: Normal range of motion.  Skin:    General: Skin is warm.     Comments: 2cm wound right palm below 4th and 5th finger,  from   decreased sensation distal finger  nv intact  Neurological:     General: No focal deficit present.     Mental Status: She is alert.  Psychiatric:        Mood and Affect: Mood normal.     ED Results / Procedures / Treatments   Labs (all labs ordered are listed, but only abnormal results are displayed) Labs Reviewed - No data to display  EKG None  Radiology DG Hand Complete Right  Result Date: 05/11/2022 CLINICAL DATA:  Right hand laceration. EXAM: RIGHT HAND - COMPLETE 3+ VIEW COMPARISON:  04/30/2015 FINDINGS: There is a large bandage over the  metacarpophalangeal joint region with associated artifact. No obvious radiopaque foreign body. No fracture. IMPRESSION: No acute bony findings or radiopaque foreign body. Electronically Signed   By: Marijo Sanes M.D.   On: 05/11/2022 13:59    Procedures .Marland KitchenLaceration Repair  Date/Time: 05/11/2022 7:26 PM  Performed by: Fransico Meadow, PA-C Authorized by: Fransico Meadow, PA-C   Consent:    Consent obtained:  Verbal   Consent given by:  Patient   Risks, benefits, and alternatives were discussed: yes     Risks discussed:  Infection   Alternatives discussed:  Delayed treatment Universal protocol:    Procedure explained and questions answered to patient or proxy's satisfaction: yes     Immediately prior to procedure, a time out was called: yes     Patient identity confirmed:  Verbally with patient Anesthesia:    Anesthesia method:  Local infiltration   Local anesthetic:  Lidocaine 1% w/o epi Laceration details:    Location:  Hand   Hand location:  R palm   Length (cm):  2   Depth (mm):  4 Pre-procedure details:    Preparation:  Patient was prepped and draped in usual sterile fashion Exploration:    Limited defect created (wound extended): no     Imaging outcome: foreign body not noted     Wound exploration: wound explored through full range of motion     Contaminated: no   Treatment:    Area cleansed with:  Povidone-iodine   Amount of cleaning:  Standard   Irrigation solution:  Sterile water   Debridement:  None Skin repair:    Repair method:  Sutures   Suture size:  5-0   Suture material:  Prolene   Suture technique:  Simple interrupted   Number of sutures:  4 Approximation:    Approximation:  Loose Repair type:    Repair type:  Simple Post-procedure details:    Procedure completion:  Tolerated well, no immediate complications     Medications Ordered in ED Medications  lidocaine (PF) (XYLOCAINE) 1 % injection 30 mL (30 mLs Infiltration Given 05/11/22 1912)    ED  Course/ Medical Decision Making/ A&P                           Medical Decision Making Risk Prescription drug management.           Final Clinical Impression(s) / ED Diagnoses Final diagnoses:  Laceration of right hand, foreign body presence unspecified, initial encounter    Rx / DC Orders ED Discharge Orders     None  An After Visit Summary was printed and given to the patient.     Sidney Ace 05/11/22 2141    Isla Pence, MD 05/11/22 2206

## 2022-05-11 NOTE — ED Provider Triage Note (Signed)
Emergency Medicine Provider Triage Evaluation Note  Tiffany Wong , a 31 y.o. female  was evaluated in triage.  Pt complains of laceration to her right hand.  This occurred shortly prior to arrival when she was moving a lamp.  She reports subjective numbness primarily over the right little finger.    Physical Exam  BP 109/63 (BP Location: Right Arm)   Pulse 90   Temp 98.6 F (37 C) (Oral)   Resp 16   Ht 5\' 1"  (1.549 m)   Wt 46.7 kg   SpO2 100%   BMI 19.46 kg/m  Gen:   Awake, no distress   Resp:  Normal effort  MSK:   Moves extremities without difficulty, able to flex and extend all fingers on right hand Other:  Approximately 3 cm laceration over the ulnar aspect of the right hand on the palmar surface.  Medical Decision Making  Medically screening exam initiated at 1:41 PM.  Appropriate orders placed.  Pranavi Krider was informed that the remainder of the evaluation will be completed by another provider, this initial triage assessment does not replace that evaluation, and the importance of remaining in the ED until their evaluation is complete.  Wound is wrapped.  X-rays ordered.  Anticipate patient will require repair.   Durene Cal, Cristina Gong 05/11/22 1343
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
## 2022-05-11 NOTE — Discharge Instructions (Addendum)
Return if any problems.  Call Dr. Edmonia James Surgeon to schedule to be seen for evaluation of numbness.  Suture removal in 8 days

## 2023-03-11 ENCOUNTER — Ambulatory Visit (HOSPITAL_COMMUNITY)
Admission: EM | Admit: 2023-03-11 | Discharge: 2023-03-12 | Disposition: A | Discharge status: Other Acute Inpatient Hospital | Payer: Medicaid Other | Attending: Psychiatry | Admitting: Psychiatry

## 2023-03-11 ENCOUNTER — Encounter (HOSPITAL_COMMUNITY): Payer: Self-pay | Admitting: Emergency Medicine

## 2023-03-11 DIAGNOSIS — Z9151 Personal history of suicidal behavior: Secondary | ICD-10-CM | POA: Insufficient documentation

## 2023-03-11 DIAGNOSIS — F319 Bipolar disorder, unspecified: Secondary | ICD-10-CM | POA: Diagnosis not present

## 2023-03-11 DIAGNOSIS — F419 Anxiety disorder, unspecified: Secondary | ICD-10-CM | POA: Diagnosis not present

## 2023-03-11 LAB — POCT URINE DRUG SCREEN - MANUAL ENTRY (I-SCREEN)
POC Amphetamine UR: NOT DETECTED
POC Buprenorphine (BUP): NOT DETECTED
POC Cocaine UR: NOT DETECTED
POC Marijuana UR: POSITIVE — AB
POC Methadone UR: NOT DETECTED
POC Methamphetamine UR: NOT DETECTED
POC Morphine: NOT DETECTED
POC Oxazepam (BZO): NOT DETECTED
POC Oxycodone UR: NOT DETECTED
POC Secobarbital (BAR): NOT DETECTED

## 2023-03-11 LAB — POCT PREGNANCY, URINE: Preg Test, Ur: NEGATIVE

## 2023-03-11 LAB — POC URINE PREG, ED: Preg Test, Ur: NEGATIVE

## 2023-03-11 MED ORDER — TRAZODONE HCL 50 MG PO TABS: 50.0000 mg | ORAL_TABLET | Freq: Every evening | ORAL | Status: DC | PRN

## 2023-03-11 MED ORDER — HYDROXYZINE HCL 25 MG PO TABS
25.0000 mg | ORAL_TABLET | Freq: Three times a day (TID) | ORAL | Status: DC | PRN
  Administered 2023-03-11: 25 mg via ORAL
  Filled 2023-03-11: qty 1

## 2023-03-11 MED ORDER — ACETAMINOPHEN 325 MG PO TABS: 650.0000 mg | ORAL_TABLET | Freq: Four times a day (QID) | ORAL | Status: DC | PRN

## 2023-03-11 MED ORDER — LORAZEPAM 2 MG/ML IJ SOLN: 1.0000 mg | Freq: Once | INTRAMUSCULAR | Status: AC | PRN

## 2023-03-11 MED ORDER — MAGNESIUM HYDROXIDE 400 MG/5ML PO SUSP: 30.0000 mL | Freq: Every day | ORAL | Status: DC | PRN

## 2023-03-11 MED ORDER — ZIPRASIDONE MESYLATE 20 MG IM SOLR: 20.0000 mg | Freq: Two times a day (BID) | INTRAMUSCULAR | Status: DC | PRN

## 2023-03-11 MED ORDER — LORAZEPAM 1 MG PO TABS
1.0000 mg | ORAL_TABLET | Freq: Once | ORAL | Status: AC | PRN
  Administered 2023-03-11: 1 mg via ORAL
  Filled 2023-03-11: qty 1

## 2023-03-11 MED ORDER — OLANZAPINE 5 MG PO TBDP
5.0000 mg | ORAL_TABLET | Freq: Once | ORAL | Status: AC | PRN
  Administered 2023-03-11: 5 mg via ORAL
  Filled 2023-03-11: qty 1

## 2023-03-11 MED ORDER — LAMOTRIGINE 25 MG PO TABS
25.0000 mg | ORAL_TABLET | Freq: Every day | ORAL | Status: DC
  Administered 2023-03-11 – 2023-03-12 (×2): 25 mg via ORAL
  Filled 2023-03-11 (×2): qty 1

## 2023-03-11 MED ORDER — NICOTINE 21 MG/24HR TD PT24
21.0000 mg | MEDICATED_PATCH | Freq: Every day | TRANSDERMAL | Status: DC
  Administered 2023-03-11 – 2023-03-12 (×2): 21 mg via TRANSDERMAL
  Filled 2023-03-11 (×2): qty 1

## 2023-03-11 MED ORDER — ALUM & MAG HYDROXIDE-SIMETH 200-200-20 MG/5ML PO SUSP: 30.0000 mL | ORAL | Status: DC | PRN

## 2023-03-11 NOTE — ED Notes (Signed)
Called [facility] to inquire about patient coming tonight - provider notified about need for orders

## 2023-03-11 NOTE — ED Notes (Signed)
Patient is now sleeping with no sxs of distress noted - will continue to monitor for safety
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
## 2023-03-11 NOTE — ED Notes (Signed)
Reached out to provider again regarding transfer orders

## 2023-03-11 NOTE — Progress Notes (Signed)
   03/11/23 1147  [facility] Triage Screening (Walk-ins at [facility] only)  How Did You Hear About [place]? Legal System  What Is the Reason for Your Visit/Call Today? Pt presents to [facility] voluntarily by [employer]. Pt reports extremem amounts of stress. Pt reports having racing thoughts constantly. Pt reports having a emotional breakdown today due to stress. Pt is not currently taking medications for Bipolar disorder and moderate anxiety. Pt does endorse SI. Pt denies HI and AVH. Pt is urgent.  How Long Has This Been Causing You Problems? 1 wk - 1 month  Have You Recently Had Any Thoughts About Hurting Yourself? Yes  How long ago did you have thoughts about hurting yourself? every few days.  Are You Planning to Commit Suicide/Harm Yourself At This time? No  Have you Recently Had Thoughts About Hurting Someone [name]? No  Are You Planning To Harm Someone At This Time? No  Are you currently experiencing any auditory, visual or other hallucinations? No  Have You Used Any Alcohol or Drugs in the Past 24 Hours? Yes  How long ago did you use Drugs or Alcohol? marijuana  What Did You Use and How Much? Last night half a jont  Do you have any current medical co-morbidities that require immediate attention? No  Clinician description of patient physical appearance/behavior: Fairly groomed, Upset  What Do You Feel Would Help You the Most Today? Treatment for Depression or other mood problem;Medication(s)  If access to [facility] was not available, would you have sought care in the Emergency Department? Yes  Determination of Need Urgent (48 hours)  Options For Referral Therapeutic Triage [facility]

## 2023-03-11 NOTE — ED Notes (Signed)
Patient sitting on unit quietly, no distress noted, will continue to monitor patient for safety

## 2023-03-11 NOTE — ED Notes (Signed)
Voluntary consent form faxed to [facility] and confirmation received. Original placed in envelope for transfer. Copy made and placed in folder with confirmation.

## 2023-03-11 NOTE — ED Notes (Signed)
Patient has been taken to the unit.

## 2023-03-11 NOTE — ED Notes (Signed)
Patient resting with not sxs of distress - will continue to monitor for safety
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
## 2023-03-11 NOTE — ED Notes (Signed)
Call placed to [facility] to see when patient can come over tonight.  The charge nurse is to call back with information
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
## 2023-03-11 NOTE — ED Triage Notes (Signed)
Pt presents to [facility] voluntarily by [employer]. Pt reports extremem amounts of stress. Pt reports having racing thoughts constantly. Pt reports having a emotional breakdown today due to stress. Pt is not currently taking medications for Bipolar disorder and moderate anxiety. Pt does endorse SI. Pt denies HI and AVH. Pt is urgent.

## 2023-03-11 NOTE — Progress Notes (Signed)
   03/11/23 1147  Vital Signs  Temp 98.5 F (36.9 C)  Temp Source Oral  Pulse Rate 92  Pulse Rate Source Dinamap  Resp 18  BP 104/64  BP Location Left Arm  BP Method Automatic  Patient Position (if appropriate) Sitting  Oxygen Therapy  SpO2 100 %  O2 Device Room Air

## 2023-03-11 NOTE — Progress Notes (Signed)
   03/11/23 1155  [place] Suicide Severity Rating Scale  1. Wish to be Dead Yes  2. Suicidal Thoughts Yes  3. Suicidal Thoughts with Method Without Specific Plan or Intent to Act No  4. Suicidal Intent Without Specific Plan No  5. Suicide Intent with Specific Plan No  6. Suicide Behavior Question Yes  7. How long ago did you do any of these? Between three months and a year ago  C-SSRS RISK CATEGORY Moderate Risk  Patient location: [facility] Urgent Care/Facility Based Crisis Center  [facility] Urgent Care/Facility Based Crisis Center Suicide Precautions Interventions  [facility] Suicide Precautions Interventions Moderate Risk Interventions

## 2023-03-11 NOTE — ED Notes (Signed)
Patient with no distress noted at this time - will continue to monitor for safety

## 2023-03-12 ENCOUNTER — Encounter: Payer: Self-pay | Admitting: Psychiatry

## 2023-03-12 ENCOUNTER — Other Ambulatory Visit: Payer: Self-pay

## 2023-03-12 ENCOUNTER — Inpatient Hospital Stay
Admission: AD | Admit: 2023-03-12 | Discharge: 2023-03-18 | DRG: 885 | Disposition: A | Discharge status: Home / Self Care | Payer: Medicaid Other | Source: Other Acute Inpatient Hospital | Attending: Psychiatry | Admitting: Psychiatry

## 2023-03-12 DIAGNOSIS — F17210 Nicotine dependence, cigarettes, uncomplicated: Secondary | ICD-10-CM | POA: Diagnosis present

## 2023-03-12 DIAGNOSIS — Z9151 Personal history of suicidal behavior: Secondary | ICD-10-CM | POA: Diagnosis not present

## 2023-03-12 DIAGNOSIS — Z91410 Personal history of adult physical and sexual abuse: Secondary | ICD-10-CM | POA: Diagnosis not present

## 2023-03-12 DIAGNOSIS — F3160 Bipolar disorder, current episode mixed, unspecified: Principal | ICD-10-CM | POA: Diagnosis present

## 2023-03-12 DIAGNOSIS — Z79899 Other long term (current) drug therapy: Secondary | ICD-10-CM | POA: Diagnosis not present

## 2023-03-12 DIAGNOSIS — Z5986 Financial insecurity: Secondary | ICD-10-CM | POA: Diagnosis not present

## 2023-03-12 DIAGNOSIS — Z560 Unemployment, unspecified: Secondary | ICD-10-CM | POA: Diagnosis not present

## 2023-03-12 DIAGNOSIS — Z5982 Transportation insecurity: Secondary | ICD-10-CM

## 2023-03-12 DIAGNOSIS — Z8249 Family history of ischemic heart disease and other diseases of the circulatory system: Secondary | ICD-10-CM

## 2023-03-12 DIAGNOSIS — R45851 Suicidal ideations: Secondary | ICD-10-CM | POA: Diagnosis present

## 2023-03-12 DIAGNOSIS — Z818 Family history of other mental and behavioral disorders: Secondary | ICD-10-CM

## 2023-03-12 DIAGNOSIS — Z83438 Family history of other disorder of lipoprotein metabolism and other lipidemia: Secondary | ICD-10-CM | POA: Diagnosis not present

## 2023-03-12 DIAGNOSIS — F1210 Cannabis abuse, uncomplicated: Secondary | ICD-10-CM | POA: Diagnosis present

## 2023-03-12 DIAGNOSIS — Z832 Family history of diseases of the blood and blood-forming organs and certain disorders involving the immune mechanism: Secondary | ICD-10-CM | POA: Diagnosis not present

## 2023-03-12 DIAGNOSIS — Z813 Family history of other psychoactive substance abuse and dependence: Secondary | ICD-10-CM

## 2023-03-12 DIAGNOSIS — F319 Bipolar disorder, unspecified: Secondary | ICD-10-CM | POA: Diagnosis present

## 2023-03-12 MED ORDER — HALOPERIDOL 5 MG PO TABS: 5.0000 mg | ORAL_TABLET | Freq: Three times a day (TID) | ORAL | Status: DC | PRN

## 2023-03-12 MED ORDER — NICOTINE 14 MG/24HR TD PT24
14.0000 mg | MEDICATED_PATCH | Freq: Every day | TRANSDERMAL | Status: DC
  Administered 2023-03-12 – 2023-03-18 (×7): 14 mg via TRANSDERMAL
  Filled 2023-03-12 (×7): qty 1

## 2023-03-12 MED ORDER — DIPHENHYDRAMINE HCL 50 MG/ML IJ SOLN: 50.0000 mg | Freq: Three times a day (TID) | INTRAMUSCULAR | Status: DC | PRN

## 2023-03-12 MED ORDER — MAGNESIUM HYDROXIDE 400 MG/5ML PO SUSP
30.0000 mL | Freq: Every day | ORAL | Status: DC | PRN
  Administered 2023-03-13: 30 mL via ORAL
  Filled 2023-03-12: qty 30

## 2023-03-12 MED ORDER — ACETAMINOPHEN 325 MG PO TABS
650.0000 mg | ORAL_TABLET | Freq: Four times a day (QID) | ORAL | Status: DC | PRN
  Administered 2023-03-13: 650 mg via ORAL
  Filled 2023-03-12: qty 2

## 2023-03-12 MED ORDER — ALUM & MAG HYDROXIDE-SIMETH 200-200-20 MG/5ML PO SUSP: 30.0000 mL | ORAL | Status: DC | PRN

## 2023-03-12 MED ORDER — ENSURE ENLIVE PO LIQD
237.0000 mL | Freq: Two times a day (BID) | ORAL | Status: DC
  Administered 2023-03-12 – 2023-03-13 (×3): 237 mL via ORAL

## 2023-03-12 MED ORDER — TRAZODONE HCL 50 MG PO TABS: 50.0000 mg | ORAL_TABLET | Freq: Every evening | ORAL | Status: DC | PRN

## 2023-03-12 MED ORDER — HALOPERIDOL LACTATE 5 MG/ML IJ SOLN: 5.0000 mg | Freq: Three times a day (TID) | INTRAMUSCULAR | Status: DC | PRN

## 2023-03-12 MED ORDER — LORAZEPAM 2 MG PO TABS
2.0000 mg | ORAL_TABLET | Freq: Three times a day (TID) | ORAL | Status: DC | PRN
  Administered 2023-03-17: 2 mg via ORAL
  Filled 2023-03-12: qty 1

## 2023-03-12 MED ORDER — LORAZEPAM 2 MG/ML IJ SOLN: 2.0000 mg | Freq: Three times a day (TID) | INTRAMUSCULAR | Status: DC | PRN

## 2023-03-12 MED ORDER — DIPHENHYDRAMINE HCL 25 MG PO CAPS: 50.0000 mg | ORAL_CAPSULE | Freq: Three times a day (TID) | ORAL | Status: DC | PRN

## 2023-03-12 MED ORDER — HYDROXYZINE HCL 25 MG PO TABS
25.0000 mg | ORAL_TABLET | Freq: Three times a day (TID) | ORAL | Status: DC | PRN
  Administered 2023-03-12 – 2023-03-13 (×3): 25 mg via ORAL
  Filled 2023-03-12 (×3): qty 1

## 2023-03-12 MED ORDER — LAMOTRIGINE 25 MG PO TABS: 25.0000 mg | ORAL_TABLET | Freq: Every day | ORAL | Status: DC

## 2023-03-12 NOTE — Group Note (Signed)
[facility] LCSW Group Therapy Note   Group Date: 03/12/2023 Start Time: 1300 End Time: 1400  Type of Therapy and Topic:  Group Therapy:  Feelings around Relapse and Recovery  Participation Level:  Active   Mood: Euthymic  Description of Group:    Patients in this group will discuss emotions they experience before and after a relapse. They will process how experiencing these feelings, or avoidance of experiencing them, relates to having a relapse. Facilitator will guide patients to explore emotions they have related to recovery. Patients will be encouraged to process which emotions are more powerful. They will be guided to discuss the emotional reaction significant others in their lives may have to patients' relapse or recovery. Patients will be assisted in exploring ways to respond to the emotions of others without this contributing to a relapse.  Therapeutic Goals: Patient will identify two or more emotions that lead to relapse for them:  Patient will identify two emotions that result when they relapse:  Patient will identify two emotions related to recovery:  Patient will demonstrate ability to communicate their needs through discussion and/or role plays.   Summary of Patient Progress: Patient was present for the entirety of the group session. Patient was an active listener and participated in the topic of discussion, provided helpful advice to others, and added nuance to topic of conversation. Patient states she often reminisces on times where her life was more spontaneous. However, during this time, her life was more riddled with acute mental health symptoms.    Therapeutic Modalities:   Cognitive Behavioral Therapy Solution-Focused Therapy Assertiveness Training Relapse Prevention Therapy   Corky Crafts, [place]

## 2023-03-12 NOTE — ED Notes (Signed)
Patient continues to rest in recliner. Endorses some SI but denies a plan. Calm and cooperative this shift. Will continue to monitor for safety.

## 2023-03-12 NOTE — Progress Notes (Signed)
Patient was provided with a heat pack to help with her engorgement.

## 2023-03-12 NOTE — ED Notes (Signed)
Patient calm and cooperative this morning. Ate breakfast, took a shower, and was coloring prior to discharge. Pt endorses some passive SI but no plan or intent, contracts for safety. Denies HI/AVH. Pt discharged to [facility] via safe transport. Report given to [facility] RN. All belongings returned to patient.

## 2023-03-12 NOTE — ED Notes (Signed)
Pt. Was given a meal and is currently taking a shower. Will continue to monitor for safety.

## 2023-03-12 NOTE — Group Note (Deleted)
[facility] LCSW Group Therapy Note   Group Date: 03/12/2023 Start Time: 1300 End Time: 1400  Type of Therapy and Topic:  Group Therapy:  Feelings around Relapse and Recovery  Participation Level:  {[facility] PARTICIPATION LEVEL:22264}   Mood:  Description of Group:    Patients in this group will discuss emotions they experience before and after a relapse. They will process how experiencing these feelings, or avoidance of experiencing them, relates to having a relapse. Facilitator will guide patients to explore emotions they have related to recovery. Patients will be encouraged to process which emotions are more powerful. They will be guided to discuss the emotional reaction significant others in their lives may have to patients' relapse or recovery. Patients will be assisted in exploring ways to respond to the emotions of others without this contributing to a relapse.  Therapeutic Goals: 1. Patient will identify two or more emotions that lead to relapse for them:  2. Patient will identify two emotions that result when they relapse:  3. Patient will identify two emotions related to recovery:  4. Patient will demonstrate ability to communicate their needs through discussion and/or role plays.   Summary of Patient Progress:  ***   Therapeutic Modalities:   Cognitive Behavioral Therapy Solution-Focused Therapy Assertiveness Training Relapse Prevention Therapy   Kalise Fickett W Rodert Hinch, LCSWA 

## 2023-03-12 NOTE — ED Notes (Signed)
Patient continues to rest with no sxs of distress at this time - will continue to monitor for safety

## 2023-03-12 NOTE — ED Provider Notes (Signed)
FBC/OBS ASAP Discharge Summary  Date and Time: 03/12/2023 9:55 AM  Name: Tiffany Wong  MRN:  [id number]   Discharge Diagnoses:  Final diagnoses:  Bipolar I disorder (HCC)   HPI: Pt presents voluntarily to [facility] behavioral health for walk-in assessment with law enforcement escort with complaints of "deteriorating mentally".  She was recommended for inpatient psychiatric admission and was admitted to the continuous assessment unit while awaiting inpatient bed availability.  Per admission note from Darrick Grinder NP 03/11/2023 ,"On evaluation, when asked reason for presenting today, Tiffany Wong reports "deteriorating mentally". Pt reports this morning she was on the phone with her therapist when she "snapped" after her child started screaming. Reports feeling overwhelmed. Looked at her children and saw their father's face. Therapist told her to deep breathe, however, pt was unable to successfully de-escalate, and she began to destroy property, screaming, wanted to kill herself. She reports she did not have a plan at the time, although notes impulsivity."   Patient seen face-to-face by this provider and chart review on 03/12/2023.  Per chart review patient has a past psychiatric history of bipolar 1 disorder with a prior suicide attempt in 2021.  In addition she has had an inpatient psychiatric admission 05/19/20-06/14/20.  UDS positive for marijuana.  EtOH I<10.   Subjective: On today's assessment patient is walking out of the shower area.  Reports the warm water helps relieve some of the pain she is experiencing from abruptly stopping breast-feeding.  She is alert/oriented x 4, calm, cooperative, and attentive.  She has normal speech and behavior.  She continues to endorse depression and anxiety and has a depressed affect.  States she just feels "overwhelmed and unable to function".  She is in agreement to inpatient admission and is hopeful that she can be stabilized on medications.  She denies  SI/HI/AVH.  She does not appear to be responding to internal/external stimuli.  She is able to answer questions appropriately  Stay Summary:   Patient has remained calm and cooperative while on the unit.  She has been compliant with medications and appropriate with staff.  She has required no as needed medications for agitation.  She continues to meet criteria for inpatient psychiatric admission and has been accepted to [facility] for inpatient psychiatric admission.  Total Time spent with patient: 30 minutes  Past Psychiatric History: Bipolar 1 disorder Past Medical History: HSV Family Psychiatric History:  Reported family history of bipolar disorder/schizophrenia Social History: Living with children (1 y/o, 3 y/o, 70 y/o), working as Forensic psychologist  Tobacco Cessation:  Prescription not provided because: being transferred to [facility].   Current Medications:  Current Facility-Administered Medications  Medication Dose Route Frequency Provider Last Rate Last Admin   acetaminophen (TYLENOL) tablet 650 mg  650 mg Oral Q6H PRN Lauree Chandler, NP       alum & mag hydroxide-simeth (MAALOX/MYLANTA) 200-200-20 MG/5ML suspension 30 mL  30 mL Oral Q4H PRN Lauree Chandler, NP       hydrOXYzine (ATARAX) tablet 25 mg  25 mg Oral TID PRN Lauree Chandler, NP   25 mg at 03/11/23 1728   lamoTRIgine (LAMICTAL) tablet 25 mg  25 mg Oral Daily Lauree Chandler, NP   25 mg at 03/12/23 4098   magnesium hydroxide (MILK OF MAGNESIA) suspension 30 mL  30 mL Oral Daily PRN Lauree Chandler, NP       nicotine (NICODERM CQ - dosed in mg/24 hours) patch 21 mg  21  mg Transdermal Daily Lauree Chandler, NP   21 mg at 03/12/23 0920   traZODone (DESYREL) tablet 50 mg  50 mg Oral QHS PRN Lauree Chandler, NP       Current Outpatient Medications  Medication Sig Dispense Refill   ibuprofen (ADVIL) 800 MG tablet Take 800 mg by mouth every 8 (eight) hours as needed (For  migraine or pain).     [START ON 03/13/2023] lamoTRIgine (LAMICTAL) 25 MG tablet Take 1 tablet (25 mg total) by mouth daily.      PTA Medications:  Facility Ordered Medications  Medication   acetaminophen (TYLENOL) tablet 650 mg   alum & mag hydroxide-simeth (MAALOX/MYLANTA) 200-200-20 MG/5ML suspension 30 mL   magnesium hydroxide (MILK OF MAGNESIA) suspension 30 mL   hydrOXYzine (ATARAX) tablet 25 mg   traZODone (DESYREL) tablet 50 mg   lamoTRIgine (LAMICTAL) tablet 25 mg   nicotine (NICODERM CQ - dosed in mg/24 hours) patch 21 mg   [COMPLETED] LORazepam (ATIVAN) tablet 1 mg   Or   [COMPLETED] LORazepam (ATIVAN) injection 1 mg   [COMPLETED] OLANZapine zydis (ZYPREXA) disintegrating tablet 5 mg   PTA Medications  Medication Sig   [START ON 03/13/2023] lamoTRIgine (LAMICTAL) 25 MG tablet Take 1 tablet (25 mg total) by mouth daily.       03/26/2021    2:46 PM 03/12/2021    3:57 PM 09/08/2020    2:14 PM  Depression screen PHQ 2/9  Decreased Interest 3 3 2   Down, Depressed, Hopeless 3 3 2   PHQ - 2 Score 6 6 4   Altered sleeping 3 3 0  Tired, decreased energy 2 3 2   Change in appetite 2 3 2   Feeling bad or failure about yourself  3 3 2   Trouble concentrating 0 0 0  Moving slowly or fidgety/restless 0 0 0  Suicidal thoughts 2 2 0  PHQ-9 Score 18 20 10     Flowsheet Row ED from 03/11/2023 in [facility] ED from 05/11/2022 in [facility] at [facility] ED from 05/02/2022 in [facility] at [facility] RISK CATEGORY Moderate Risk No Risk No Risk       Musculoskeletal  Strength & Muscle Tone: within normal limits Gait & Station: normal Patient leans: N/A  Psychiatric Specialty Exam  Presentation  General Appearance:  Disheveled; Casual  Eye Contact: Good  Speech: Pressured  Speech Volume: Normal  Handedness: Right   Mood and Affect  Mood: Depressed; Anxious  Affect: Congruent;  Tearful   Thought Process  Thought Processes: Coherent; Goal Directed; Linear  Descriptions of Associations:Intact  Orientation:Full (Time, Place and Person)  Thought Content:Logical; WDL  Diagnosis of Schizophrenia or Schizoaffective disorder in past: No    Hallucinations:Hallucinations: None  Ideas of Reference:None  Suicidal Thoughts:Suicidal Thoughts: No  Homicidal Thoughts:Homicidal Thoughts: No   Sensorium  Memory: Immediate Good; Recent Good; Remote Good  Judgment: Intact  Insight: Fair   Art therapist  Concentration: Fair  Attention Span: Fair  Recall: Good  Fund of Knowledge: Good  Language: Good   Psychomotor Activity  Psychomotor Activity: Psychomotor Activity: Normal   Assets  Assets: Communication Skills; Desire for Improvement; Financial Resources/Insurance; Housing; Physical Health; Resilience; Social Support; Vocational/Educational   Sleep  Sleep: Sleep: Poor   Nutritional Assessment (For OBS and FBC admissions only) Has the patient had a weight loss or gain of 10 pounds or more in the last 3 months?: -- (pt is unsure) Has the patient  had a decrease in food intake/or appetite?: Yes Does the patient have dental problems?: No Does the patient have eating habits or behaviors that may be indicators of an eating disorder including binging or inducing vomiting?: No Has the patient recently lost weight without trying?: 2.0 Has the patient been eating poorly because of a decreased appetite?: 1 Malnutrition Screening Tool Score: 3    Physical Exam  Physical Exam Vitals and nursing note reviewed.  Constitutional:      General: She is not in acute distress.    Appearance: Normal appearance. She is not ill-appearing.  HENT:     Head: Normocephalic.  Eyes:     General:        Right eye: No discharge.        Left eye: No discharge.  Cardiovascular:     Rate and Rhythm: Normal rate.  Pulmonary:     Effort: Pulmonary  effort is normal.  Musculoskeletal:        General: Normal range of motion.     Cervical back: Normal range of motion.  Skin:    Coloration: Skin is not jaundiced or pale.  Neurological:     Mental Status: She is alert and oriented to person, place, and time.  Psychiatric:        Attention and Perception: Attention and perception normal.        Mood and Affect: Mood is anxious and depressed.        Speech: Speech normal.        Behavior: Behavior normal. Behavior is cooperative.        Thought Content: Thought content normal.        Cognition and Memory: Cognition normal.        Judgment: Judgment normal.    Review of Systems  Constitutional: Negative.   HENT: Negative.    Eyes: Negative.   Respiratory: Negative.    Cardiovascular: Negative.   Gastrointestinal: Negative.   Musculoskeletal: Negative.   Skin: Negative.   Neurological: Negative.   Psychiatric/Behavioral:  Positive for depression. The patient is nervous/anxious.    Blood pressure 97/78, pulse 65, temperature 97.7 F (36.5 C), temperature source Oral, resp. rate 16, SpO2 100 %, currently breastfeeding. There is no height or weight on file to calculate BMI.    Disposition: She continues to meet criteria for inpatient psychiatric admission and has been accepted to [facility] for inpatient psychiatric admission.  Ardis Hughs, NP 03/12/2023, 9:55 AM

## 2023-03-12 NOTE — Tx Team (Signed)
Initial Treatment Plan 03/12/2023 1:51 PM Annaly Kasal GNF:[id number]    PATIENT STRESSORS: Financial difficulties   Health problems   Marital or family conflict   Medication change or noncompliance   Traumatic event     PATIENT STRENGTHS: Ability for insight  Average or above average intelligence  Capable of independent living  Communication skills  General fund of knowledge  Motivation for treatment/growth  Supportive family/friends    PATIENT IDENTIFIED PROBLEMS: Family/relationship conflict  Employment status  Medication non compliance                 DISCHARGE CRITERIA:  Adequate post-discharge living arrangements Improved stabilization in mood, thinking, and/or behavior Medical problems require only outpatient monitoring Verbal commitment to aftercare and medication compliance  PRELIMINARY DISCHARGE PLAN: Outpatient therapy Return to previous living arrangement  PATIENT/FAMILY INVOLVEMENT: This treatment plan has been presented to and reviewed with the patient, Lurlean Curd..  The patient and family have been given the opportunity to ask questions and make suggestions.  Delos Haring, RN 03/12/2023, 1:51 PM

## 2023-03-12 NOTE — Plan of Care (Signed)
  Problem: Education: Goal: Knowledge of General Education information will improve Description: Including pain rating scale, medication(s)/side effects and non-pharmacologic comfort measures Outcome: Not Progressing   Problem: Health Behavior/Discharge Planning: Goal: Ability to manage health-related needs will improve Outcome: Not Progressing   Problem: Clinical Measurements: Goal: Ability to maintain clinical measurements within normal limits will improve Outcome: Not Progressing Goal: Will remain free from infection Outcome: Not Progressing Goal: Diagnostic test results will improve Outcome: Not Progressing Goal: Respiratory complications will improve Outcome: Not Progressing Goal: Cardiovascular complication will be avoided Outcome: Not Progressing   Problem: Activity: Goal: Risk for activity intolerance will decrease Outcome: Not Progressing   Problem: Nutrition: Goal: Adequate nutrition will be maintained Outcome: Not Progressing   Problem: Coping: Goal: Level of anxiety will decrease Outcome: Not Progressing   Problem: Elimination: Goal: Will not experience complications related to bowel motility Outcome: Not Progressing Goal: Will not experience complications related to urinary retention Outcome: Not Progressing   Problem: Pain Managment: Goal: General experience of comfort will improve Outcome: Not Progressing   Problem: Safety: Goal: Ability to remain free from injury will improve Outcome: Not Progressing   Problem: Skin Integrity: Goal: Risk for impaired skin integrity will decrease Outcome: Not Progressing   Problem: Education: Goal: Knowledge of [place] General Education information/materials will improve Outcome: Not Progressing Goal: Emotional status will improve Outcome: Not Progressing Goal: Mental status will improve Outcome: Not Progressing Goal: Verbalization of understanding the information provided will improve Outcome: Not  Progressing   Problem: Activity: Goal: Interest or engagement in activities will improve Outcome: Not Progressing Goal: Sleeping patterns will improve Outcome: Not Progressing   Problem: Coping: Goal: Ability to verbalize frustrations and anger appropriately will improve Outcome: Not Progressing Goal: Ability to demonstrate self-control will improve Outcome: Not Progressing   Problem: Health Behavior/Discharge Planning: Goal: Identification of resources available to assist in meeting health care needs will improve Outcome: Not Progressing Goal: Compliance with treatment plan for underlying cause of condition will improve Outcome: Not Progressing   Problem: Physical Regulation: Goal: Ability to maintain clinical measurements within normal limits will improve Outcome: Not Progressing   Problem: Safety: Goal: Periods of time without injury will increase Outcome: Not Progressing   Problem: Education: Goal: Ability to state activities that reduce stress will improve Outcome: Not Progressing   Problem: Coping: Goal: Ability to identify and develop effective coping behavior will improve Outcome: Not Progressing   Problem: Self-Concept: Goal: Ability to identify factors that promote anxiety will improve Outcome: Not Progressing Goal: Level of anxiety will decrease Outcome: Not Progressing Goal: Ability to modify response to factors that promote anxiety will improve Outcome: Not Progressing   

## 2023-03-12 NOTE — Progress Notes (Signed)
Admission note:  Consents signed, literature detailing the patient's rights, responsibilities, and visitor guidelines provided. Skin search performed with noted tattoos to right and left forearms and left shoulder. Belongings search completed with no contraband found. No SI/HI/AVH. She endorces anxiety, depression and helplessness. She states that she voluntarily came to [facility] stating she has "extreme amounts of stress and racing thoughts". She is a single mother of 3 young children. She was then oriented to her room and the unit. Patient given the opportunity to express concerns and ask questions. She is calm and cooperative attending group and interacts well with others on the unit. Patient contracts for safety and remains safe on the unit at this time.

## 2023-03-13 DIAGNOSIS — F319 Bipolar disorder, unspecified: Secondary | ICD-10-CM | POA: Diagnosis not present

## 2023-03-13 MED ORDER — LAMOTRIGINE 25 MG PO TABS
25.0000 mg | ORAL_TABLET | Freq: Every day | ORAL | Status: DC
  Administered 2023-03-13 – 2023-03-18 (×6): 25 mg via ORAL
  Filled 2023-03-13 (×6): qty 1

## 2023-03-13 MED ORDER — HYDROXYZINE HCL 25 MG PO TABS: 25.0000 mg | ORAL_TABLET | Freq: Three times a day (TID) | ORAL | Status: DC | PRN

## 2023-03-13 MED ORDER — BUSPIRONE HCL 5 MG PO TABS
5.0000 mg | ORAL_TABLET | Freq: Three times a day (TID) | ORAL | Status: DC
  Administered 2023-03-13 (×2): 5 mg via ORAL
  Filled 2023-03-13 (×2): qty 1

## 2023-03-13 MED ORDER — MELATONIN 5 MG PO TABS
2.5000 mg | ORAL_TABLET | Freq: Every evening | ORAL | Status: DC | PRN
  Administered 2023-03-13 – 2023-03-17 (×5): 2.5 mg via ORAL
  Filled 2023-03-13 (×5): qty 1

## 2023-03-13 NOTE — Group Note (Signed)
Date:  03/13/2023 Time:  6:28 PM  Group Topic/Focus:  Music Therapy     Participation Level:  Active  Participation Quality:  Appropriate  Affect:  Appropriate  Cognitive:  Appropriate  Insight: Appropriate  Engagement in Group:  Engaged  Modes of Intervention:  Socialization  Additional Comments:    Doug Sou 03/13/2023, 6:28 PM
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
## 2023-03-13 NOTE — Progress Notes (Signed)
D- Patient alert and oriented x 4. Affect bright and anxious/mood congruent. Denies SI/ HI/ AVH. Patient denies pain. She states right breast is feeling better with "less milk". She endorses depression and anxiety. Her goals for today "changing my perspective on my life as a single mom" and to "attend all groups and work in my workbook". A- Scheduled medications administered to patient, per MD orders. Support and encouragement provided.  Routine safety checks conducted every 15 minutes without incident.  Patient informed to notify staff with problems or concerns and verbalizes understanding. R- No adverse drug reactions noted.  Patient compliant with medications and treatment plan. Patient receptive, calm cooperative and interacts well with others on the unit.  Patient contracts for safety and  remains safe on the unit at this time.
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
## 2023-03-14 ENCOUNTER — Encounter: Payer: Self-pay | Admitting: Psychiatry

## 2023-03-14 DIAGNOSIS — F319 Bipolar disorder, unspecified: Secondary | ICD-10-CM | POA: Diagnosis not present

## 2023-03-14 MED ORDER — OLANZAPINE 5 MG PO TABS
2.5000 mg | ORAL_TABLET | Freq: Three times a day (TID) | ORAL | Status: DC | PRN
  Administered 2023-03-14 – 2023-03-15 (×3): 2.5 mg via ORAL
  Filled 2023-03-14 (×2): qty 1

## 2023-03-14 MED ORDER — HYDROXYZINE HCL 50 MG PO TABS: 50.0000 mg | ORAL_TABLET | Freq: Three times a day (TID) | ORAL | Status: DC | PRN

## 2023-03-14 MED ORDER — BUSPIRONE HCL 5 MG PO TABS
5.0000 mg | ORAL_TABLET | Freq: Three times a day (TID) | ORAL | Status: DC
  Administered 2023-03-14 – 2023-03-16 (×8): 5 mg via ORAL
  Filled 2023-03-14 (×8): qty 1

## 2023-03-14 MED ORDER — OLANZAPINE 5 MG PO TABS
2.5000 mg | ORAL_TABLET | Freq: Once | ORAL | Status: AC
  Administered 2023-03-14: 2.5 mg via ORAL
  Filled 2023-03-14: qty 1

## 2023-03-14 MED ORDER — ENSURE ENLIVE PO LIQD
237.0000 mL | Freq: Three times a day (TID) | ORAL | Status: DC
  Administered 2023-03-14 – 2023-03-17 (×10): 237 mL via ORAL

## 2023-03-14 MED ORDER — ADULT MULTIVITAMIN W/MINERALS CH
1.0000 | ORAL_TABLET | Freq: Every day | ORAL | Status: DC
  Administered 2023-03-14 – 2023-03-18 (×5): 1 via ORAL
  Filled 2023-03-14 (×5): qty 1

## 2023-03-14 NOTE — Progress Notes (Signed)
NUTRITION ASSESSMENT  Pt identified as at risk on the Malnutrition Screen Tool  INTERVENTION:  -Increase Ensure Enlive po TID, each supplement provides 350 kcal and 20 grams of protein -MVI with minerals daily -Continue regular diet   NUTRITION DIAGNOSIS: Unintentional weight loss related to sub-optimal intake as evidenced by pt report.   Goal: Pt to meet >/= 90% of their estimated nutrition needs.  Monitor:  PO intake  Assessment:  Pt admitted with bipolar disorder, depression, and cannabis use disorder.   Per H&P, pt feels very stressed and overwhelmed in regards to her work and home life. She uses cannabis to to assist with appetite and anxiety.   Pt has been attending group sessions.   Pt currently on a regular diet. No meal completions data available to assess at this time.   Reviewed wt hx; pt has experienced a 11.1% wt loss over the past 10 months. While this is not significant for time frame, it is concerning given her underweight status, poor appetite, and drug use. Highly suspect pt with malnutrition, however, unable to identify at this time. Pt would greatly benefit from addition of oral nutrition supplements.   Labs reviewed: Na: 134.    32 y.o. female  Height: Ht Readings from Last 1 Encounters:  03/12/23 5\' 1"  (1.549 m)    Weight: Wt Readings from Last 1 Encounters:  03/12/23 41.5 kg    Weight Hx: Wt Readings from Last 10 Encounters:  03/12/23 41.5 kg  05/11/22 46.7 kg  07/17/21 53.5 kg  04/30/21 48.3 kg  03/26/21 44.2 kg  03/12/21 45.1 kg  09/08/20 46.3 kg  06/10/20 42.6 kg  06/01/20 45.4 kg  03/16/20 46.6 kg    BMI:  Body mass index is 17.29 kg/m. Pt meets criteria for underweight based on current BMI.  Estimated Nutritional Needs: Kcal: 25-30 kcal/kg Protein: > 1 gram protein/kg Fluid: 1 ml/kcal  Diet Order:  Diet Order             Diet regular Room service appropriate? Yes; Fluid consistency: Thin  Diet effective now                   Pt is also offered choice of unit snacks mid-morning and mid-afternoon.  Pt is eating as desired.   Lab results and medications reviewed.   Levada Schilling, RD, LDN, CDCES Registered Dietitian II Certified Diabetes Care and Education Specialist Please refer to [facility] for RD and/or RD on-call/weekend/after hours pager

## 2023-03-14 NOTE — Group Note (Signed)
Date:  03/14/2023 Time:  8:41 PM  Group Topic/Focus:  Wrap-Up Group:   The focus of this group is to help patients review their daily goal of treatment and discuss progress on daily workbooks.    Participation Level:  Active  Participation Quality:  Appropriate and Attentive  Affect:  Appropriate and Excited  Cognitive:  Alert and Appropriate  Insight: Appropriate and Good  Engagement in Group:  Engaged and Improving  Modes of Intervention:  Clarification, Discussion, Education, Socialization, and Support  Additional Comments:     Sedra Morfin 03/14/2023, 8:41 PM
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
## 2023-03-14 NOTE — Group Note (Signed)
LCSW Group Therapy Note  Group Date: 03/14/2023 Start Time: 1300 End Time: 1400   Type of Therapy and Topic:  Group Therapy - How To Cope with Nervousness about Discharge   Participation Level:  Active   Description of Group This process group involved identification of patients' feelings about discharge. Some of them are scheduled to be discharged soon, while others are new admissions, but each of them was asked to share thoughts and feelings surrounding discharge from the hospital. One common theme was that they are excited at the prospect of going home, while another was that many of them are apprehensive about sharing why they were hospitalized. Patients were given the opportunity to discuss these feelings with their peers in preparation for discharge.  Therapeutic Goals  Patient will identify their overall feelings about pending discharge. Patient will think about how they might proactively address issues that they believe will once again arise once they get home (i.e. with parents). Patients will participate in discussion about having hope for change.   Summary of Patient Progress:   Patient was present for the entirety of the group session. Patient was an active listener and participated in the topic of discussion, provided helpful advice to others, and added nuance to topic of conversation. Patient shared she is anxious, yet optimistic about discharging from the hospital. Patient was instrumental in developing understanding of how we can work towards preventing/minimizing readmission.     Therapeutic Modalities Cognitive Behavioral Therapy   Almedia Balls 03/14/2023  10:42 PM

## 2023-03-14 NOTE — [facility] Group Notes (Signed)
[facility] Group Notes:  (Nursing/MHT/Case Management/Adjunct)  Date:  03/14/2023  Time:  1:09 PM  Type of Therapy:  Psychoeducational Skills  Participation Level:  Active  Participation Quality:  Appropriate  Affect:  Appropriate  Cognitive:  Appropriate  Insight:  Good  Engagement in Group:  Engaged  Modes of Intervention:  Discussion  Summary of Progress/Problems:  Tiffany Wong 03/14/2023, 1:09 PM

## 2023-03-14 NOTE — Progress Notes (Signed)
D - Patient endorsing intermittent anxiety.  Cooperative and pleasant with staff/peers.  Continues to feel 'overwhelmed" with family stressors, but stated that she has benefited from her hospital stay so far.  Mood slightly elevated and pressured speech noted.  Able to remain seated to watch an entire movie and fell asleep before 2130.  Denied suicidal thoughts.  A - PRN melatonin and hydroxyzine administered during the evening at the patient's request.  Therapeutic communication and emotional support provided.  Hot and cold packs given for comfort (breat pain).    R - Patient slept from 2130 until the morning, voicing no concerns or complaints.  No unsafe behaviors noted.  Will continue to provided support and monitor for safety.

## 2023-03-14 NOTE — Progress Notes (Signed)
Patient awake and alert on unit.  She met with M.D. this morning and they spoke at length.  Patient was tearful afterwards.  She accepted support and was allowed to vent her fears and frustration.  Patient was consoled and felt better afterwards.  She accepted Morning medication.   Newly ordered meds were reviewed with her and she verbalized understanding.  Patient also discussed her concern about engorged breasts due to her not breastfeeding any longer.  She was offered and accepted hot and cold packs from RN.  RN also reviewed how to hand express in the shower to relieve engorgement and discomfort.  Will monitor and provide support as needed.

## 2023-03-14 NOTE — BH IP Treatment Plan (Signed)
Interdisciplinary Treatment and Diagnostic Plan Update  03/14/2023 Time of Session: 0830 Tiffany Wong MRN: [id number]  Principal Diagnosis: Bipolar 1 disorder (HCC)  Secondary Diagnoses: Principal Problem:   Bipolar 1 disorder (HCC)   Current Medications:  Current Facility-Administered Medications  Medication Dose Route Frequency Provider Last Rate Last Admin   acetaminophen (TYLENOL) tablet 650 mg  650 mg Oral Q6H PRN [name], [name], NP   650 mg at 03/13/23 0337   alum & mag hydroxide-simeth (MAALOX/MYLANTA) 200-200-20 MG/5ML suspension 30 mL  30 mL Oral Q4H PRN [name], [name], NP       busPIRone (BUSPAR) tablet 5 mg  5 mg Oral TID with meals [name], MD   5 mg at 03/14/23 1626   diphenhydrAMINE (BENADRYL) capsule 50 mg  50 mg Oral TID PRN [name], [name], NP       Or   diphenhydrAMINE (BENADRYL) injection 50 mg  50 mg Intramuscular TID PRN [name], [name], NP       feeding supplement (ENSURE ENLIVE / ENSURE PLUS) liquid 237 mL  237 mL Oral TID [name], [name], MD   237 mL at 03/14/23 1400   lamoTRIgine (LAMICTAL) tablet 25 mg  25 mg Oral Daily [name], MD   25 mg at 03/14/23 0919   LORazepam (ATIVAN) tablet 2 mg  2 mg Oral TID PRN [name], [name], NP       Or   LORazepam (ATIVAN) injection 2 mg  2 mg Intramuscular TID PRN [name], [name], NP       magnesium hydroxide (MILK OF MAGNESIA) suspension 30 mL  30 mL Oral Daily PRN [name], [name], NP   30 mL at 03/13/23 1205   melatonin tablet 2.5 mg  2.5 mg Oral QHS PRN [name], MD   2.5 mg at 03/14/23 2124   multivitamin with minerals tablet 1 tablet  1 tablet Oral Daily [name], [name], MD   1 tablet at 03/14/23 0919   nicotine (NICODERM CQ - dosed in mg/24 hours) patch 14 mg  14 mg Transdermal Daily [name], MD   14 mg at 03/14/23 6578   OLANZapine (ZYPREXA) tablet 2.5 mg  2.5 mg Oral TID PRN [name], MD   2.5 mg at 03/14/23 2124   PTA Medications: Medications Prior to Admission  Medication Sig Dispense Refill  Last Dose   ibuprofen (ADVIL) 800 MG tablet Take 800 mg by mouth every 8 (eight) hours as needed (For migraine or pain).      lamoTRIgine (LAMICTAL) 25 MG tablet Take 1 tablet (25 mg total) by mouth daily.       Patient Stressors: Financial difficulties   Health problems   Marital or family conflict   Medication change or noncompliance   Traumatic event    Patient Strengths: Ability for insight  Average or above average intelligence  Capable of independent living  Communication skills  General fund of knowledge  Motivation for treatment/growth  Supportive family/friends   Treatment Modalities: Medication Management, Group therapy, Case management,  1 to 1 session with clinician, Psychoeducation, Recreational therapy.   Physician Treatment Plan for Primary Diagnosis: Bipolar 1 disorder (HCC) Long Term Goal(s): Improvement in symptoms so as ready for discharge   Short Term Goals: Ability to identify changes in lifestyle to reduce recurrence of condition will improve  Medication Management: Evaluate patient's response, side effects, and tolerance of medication regimen.  Therapeutic Interventions: 1 to 1 sessions, Unit Group sessions and Medication administration.  Evaluation of Outcomes: Progressing  Physician Treatment Plan for Secondary Diagnosis: Principal Problem:   Bipolar 1 disorder (HCC)  Long Term Goal(s): Improvement in symptoms so as ready for discharge   Short Term Goals: Ability to identify changes in lifestyle to reduce recurrence of condition will improve     Medication Management: Evaluate patient's response, side effects, and tolerance of medication regimen.  Therapeutic Interventions: 1 to 1 sessions, Unit Group sessions and Medication administration.  Evaluation of Outcomes: Progressing   RN Treatment Plan for Primary Diagnosis: Bipolar 1 disorder (HCC) Long Term Goal(s): Knowledge of disease and therapeutic regimen to maintain health will  improve  Short Term Goals: Ability to remain free from injury will improve, Ability to verbalize frustration and anger appropriately will improve, Ability to demonstrate self-control, Ability to participate in decision making will improve, Ability to verbalize feelings will improve, Ability to disclose and discuss suicidal ideas, Ability to identify and develop effective coping behaviors will improve, and Compliance with prescribed medications will improve  Medication Management: RN will administer medications as ordered by provider, will assess and evaluate patient's response and provide education to patient for prescribed medication. RN will report any adverse and/or side effects to prescribing provider.  Therapeutic Interventions: 1 on 1 counseling sessions, Psychoeducation, Medication administration, Evaluate responses to treatment, Monitor vital signs and CBGs as ordered, Perform/monitor CIWA, COWS, AIMS and Fall Risk screenings as ordered, Perform wound care treatments as ordered.  Evaluation of Outcomes: Progressing   LCSW Treatment Plan for Primary Diagnosis: Bipolar 1 disorder (HCC) Long Term Goal(s): Safe transition to appropriate next level of care at discharge, Engage patient in therapeutic group addressing interpersonal concerns.  Short Term Goals: Engage patient in aftercare planning with referrals and resources, Increase social support, Increase ability to appropriately verbalize feelings, Increase emotional regulation, Facilitate acceptance of mental health diagnosis and concerns, Facilitate patient progression through stages of change regarding substance use diagnoses and concerns, Identify triggers associated with mental health/substance abuse issues, and Increase skills for wellness and recovery  Therapeutic Interventions: Assess for all discharge needs, 1 to 1 time with Social worker, Explore available resources and support systems, Assess for adequacy in community support  network, Educate family and significant other(s) on suicide prevention, Complete Psychosocial Assessment, Interpersonal group therapy.  Evaluation of Outcomes: Progressing   Progress in Treatment: Attending groups: Yes. Participating in groups: Yes. Taking medication as prescribed: Yes. Toleration medication: Yes. Family/Significant other contact made: No, will contact:  patient declined  Patient understands diagnosis: Yes. Discussing patient identified problems/goals with staff: Yes. Medical problems stabilized or resolved: Yes. Denies suicidal/homicidal ideation: No. Issues/concerns per patient self-inventory: Yes. Other: none  New problem(s) identified: No, Describe:  none  New Short Term/Long Term Goal(s): Patient to work towards detox, medication management for mood stabilization; elimination of SI thoughts; development of comprehensive mental wellness/sobriety plan.  Patient Goals: Patient states their goal for treatment is to "balancing out racing thoughts."  Discharge Plan or Barriers: No psychosocial barriers identified at this time, patient to return to place of residence when appropriate for discharge.   Reason for Continuation of Hospitalization: Anxiety Depression Medication stabilization  Estimated Length of Stay: 1-7 days   Last 3 [place] Suicide Severity Risk Score: Flowsheet Row Admission (Current) from 03/12/2023 in [facility] ED from [facility]11/20[facility] in [facility] ED from 05/11/2022 in [facility] Department [facility]  C-SSRS RISK CATEGORY No Risk Moderate Risk No Risk  Last PHQ 2/9 Scores:    03/26/2021    2:46 PM 03/12/2021    3:57 PM 09/08/2020    2:14 PM  Depression screen PHQ 2/9  Decreased Interest 3 3 2   Down, Depressed, Hopeless 3 3 2   PHQ - 2 Score 6 6 4   Altered sleeping 3 3 0  Tired, decreased energy 2 3 2   Change in appetite 2 3 2   Feeling bad or failure about  yourself  3 3 2   Trouble concentrating 0 0 0  Moving slowly or fidgety/restless 0 0 0  Suicidal thoughts 2 2 0  PHQ-9 Score 18 20 10     Scribe for Treatment Team: Almedia Balls 03/14/2023 10:25 PM

## 2023-03-14 NOTE — [facility] Suicide Risk Assessment (Signed)
[facility] INPATIENT:  Family/Significant Other Suicide Prevention Education  Suicide Prevention Education:  Patient Refusal for Family/Significant Other Suicide Prevention Education: The patient Tiffany Wong has refused to provide written consent for family/significant other to be provided Family/Significant Other Suicide Prevention Education during admission and/or prior to discharge.  Physician notified.  Corky Crafts 03/14/2023, 9:10 PM
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
## 2023-03-14 NOTE — Group Note (Signed)
Date:  03/14/2023 Time:  3:46 PM  Group Topic/Focus:  Activity Group    Participation Level:  Active  Participation Quality:  Appropriate  Affect:  Appropriate  Cognitive:  Alert  Insight: Good  Engagement in Group:  Engaged  Modes of Intervention:  Activity  Additional Comments:    Mary Sella Aldous Housel 03/14/2023, 3:46 PM

## 2023-03-15 DIAGNOSIS — F319 Bipolar disorder, unspecified: Secondary | ICD-10-CM | POA: Diagnosis not present

## 2023-03-15 MED ORDER — OLANZAPINE 5 MG PO TABS
5.0000 mg | ORAL_TABLET | Freq: Three times a day (TID) | ORAL | Status: DC | PRN
  Administered 2023-03-15 – 2023-03-16 (×2): 5 mg via ORAL
  Filled 2023-03-15 (×3): qty 1

## 2023-03-15 NOTE — Group Note (Signed)
Date:  03/15/2023 Time:  10:04 AM  Group Topic/Focus:  Goals Group:   The focus of this group is to help patients establish daily goals to achieve during treatment and discuss how the patient can incorporate goal setting into their daily lives to aide in recovery.    Participation Level:  Active  Participation Quality:  Appropriate  Affect:  Appropriate  Cognitive:  Appropriate  Insight: Appropriate  Engagement in Group:  Engaged  Modes of Intervention:  Discussion, Education, and Support  Additional Comments:    Tiffany Wong [name] 03/15/2023, 10:04 AM  

## 2023-03-15 NOTE — Group Note (Signed)
Date:  03/15/2023 Time:  8:58 PM  Group Topic/Focus:  Wrap-Up Group:   The focus of this group is to help patients review their daily goal of treatment and discuss progress on daily workbooks.    Participation Level:  Active  Participation Quality:  Appropriate and Attentive  Affect:  Appropriate  Cognitive:  Alert and Appropriate  Insight: Appropriate and Good  Engagement in Group:  Developing/Improving  Modes of Intervention:  Discussion  Additional Comments:     Valmai Vandenberghe 03/15/2023, 8:58 PM

## 2023-03-15 NOTE — Progress Notes (Signed)
Pt is restless but cooperative and pleasant.  Pt denies SI HI AVH.  Pt displaying no irritability or aggressive speech this shift.  Pt is appropriately social in the milieu.  Continued monitor for safety.  03/15/23 0300  Psych Admission Type (Psych Patients Only)  Admission Status Voluntary  Psychosocial Assessment  Patient Complaints Anxiety  Eye Contact Fair  Facial Expression Animated  Affect Anxious  Speech Logical/coherent  Interaction Assertive  Motor Activity Restless  Appearance/Hygiene Unremarkable  Behavior Characteristics Cooperative  Mood Anxious  Thought Process  Coherency WDL  Content WDL  Delusions None reported or observed  Perception WDL  Hallucination None reported or observed  Judgment Impaired  Confusion None  Danger to Self  Current suicidal ideation? Denies  Danger to Others  Danger to Others None reported or observed

## 2023-03-15 NOTE — Group Note (Signed)
[facility] LCSW Group Therapy Note   Group Date: 03/15/2023 Start Time: 1310 End Time: 1420  Type of Therapy/Topic:  Group Therapy:  Feelings about Diagnosis  Participation Level:  Active     Description of Group:    This group will allow patients to explore their thoughts and feelings about diagnoses they have received. Patients will be guided to explore their level of understanding and acceptance of these diagnoses. Facilitator will encourage patients to process their thoughts and feelings about the reactions of others to their diagnosis, and will guide patients in identifying ways to discuss their diagnosis with significant others in their lives. This group will be process-oriented, with patients participating in exploration of their own experiences as well as giving and receiving support and challenge from other group members.   Therapeutic Goals: 1. Patient will demonstrate understanding of diagnosis as evidence by identifying two or more symptoms of the disorder:  2. Patient will be able to express two feelings regarding the diagnosis 3. Patient will demonstrate ability to communicate their needs through discussion and/or role plays  Summary of Patient Progress: Patient was present for the entirety of the group process. She was actively involved in the conversation. Pt defined diagnosis as a label based on a set of symptoms. She shared that she agrees with her diagnoses. Pt does not believe that there is a difference between a mental health diagnosis versus a physical health one. She says that only difference that she sees is that there are diagnostic tests for physical health issues while you cannot examine someone's brain to see if they have low levels of serotonin. Pt shared that initially when she found out her diagnoses she was upset but with time and doing the things that she needs to do to keep herself healthy she has come to accept it and move through it. Pt presented with some insight  into the topic. She appeared open and receptive to feedback/comments from both peers and facilitator.    Therapeutic Modalities:   Cognitive Behavioral Therapy Brief Therapy Feelings Identification    Glenis Smoker, LCSW
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
## 2023-03-15 NOTE — Group Note (Signed)
Date:  03/15/2023 Time:  4:52 PM  Group Topic/Focus:  Activity Group    Participation Level:  Active  Participation Quality:  Appropriate  Affect:  Appropriate  Cognitive:  Appropriate  Insight: Appropriate  Engagement in Group:  Engaged  Modes of Intervention:  Activity    Mary Sella Jacon Whetzel 03/15/2023, 4:52 PM

## 2023-03-15 NOTE — Group Note (Signed)
Recreation Therapy Group Note   Group Topic:Goal Setting  Group Date: 03/15/2023 Start Time: 1000 End Time: 1105 Facilitators: Rosina Lowenstein, LRT, CTRS Location:  Craft Room  Group Description: Vision Board. Patients were given many different magazines, a glue stick, markers, and a piece of cardstock paper. LRT and pts discussed the importance of having goals in life. LRT and pts discussed the difference between short-term and long-term goals, as well as what a SMART goal is. LRT encouraged pts to create a vision board, with images they picked and then cut out with safety scissors from the magazine, for themselves, that capture their short and long-term goals. LRT encouraged pts to show and explain their vision board to the group. LRT offered to laminate vision board once dry and complete.   Goal Area(s) Addressed:  Patient will gain knowledge of short vs. long term goals.  Patient will identify goals for themselves. Patient will practice setting SMART goals. Patient will verbalize their goals to LRT and peers.  Affect/Mood: Appropriate and Happy   Participation Level: Active and Engaged   Participation Quality: Independent   Behavior: Eager   Speech/Thought Process: Coherent   Insight: Good   Judgement: Good   Modes of Intervention: Activity and Guided Discussion   Patient Response to Interventions:  Attentive, Engaged, Interested , and Receptive   Education Outcome:  Acknowledges education   Clinical Observations/Individualized Feedback: Tiffany Wong was active in their participation of session activities and group discussion. Pt identified "I want to increase my self confidence, be financially stable, have a nice house that I own and keep my family together and central." Pt requested that her vision board be laminated after group. Pt interacted well with LRT and peers duration of session. Pt was very talkative and had a bright affect throughout.  Plan: Continue to engage  patient in RT group sessions 2-3x/week.   Rosina Lowenstein, LRT, CTRS 03/15/2023 11:39 AM

## 2023-03-15 NOTE — Progress Notes (Signed)
Patient ID: Tiffany Wong, female   DOB: [birth year], 32 y.o.   MRN: [id number] Patient presents with pleasant mood, affect congruent. Tiffany Wong states '' I'm feeling more myself. I'm better I feel more calm. '' She reports she continues to have racing thoughts and trouble falling asleep last night but did rest well and feels '' a little groggy still this morning, maybe I think I need to not take the melatonin with the zyprexa ''  She has been interactive on the unit attending programming and meals and eating and drinking well. Additional ensure given as well. She completed her self inventory form and rates her depression at 5/10 on scale 10 being worst 0 being none. She rates her hopelessness at 0/10 on scale 20 being worst 0 being none. She rates her anxiety at 7/10 on scale 10 being worst 0 being none. She denies any SI HI . Does report relief from hand pump for breast engorgement as well. She states her goal is to '' focus and practicing breathing techniques and attend group and check in with myself more. .''  She is safe, able to make needs known. will con't to monitor.

## 2023-03-16 DIAGNOSIS — F319 Bipolar disorder, unspecified: Secondary | ICD-10-CM | POA: Diagnosis not present

## 2023-03-16 MED ORDER — BUSPIRONE HCL 5 MG PO TABS
10.0000 mg | ORAL_TABLET | Freq: Three times a day (TID) | ORAL | Status: DC
  Administered 2023-03-16 – 2023-03-18 (×5): 10 mg via ORAL
  Filled 2023-03-16 (×6): qty 2

## 2023-03-16 MED ORDER — HYDROXYZINE HCL 50 MG PO TABS
50.0000 mg | ORAL_TABLET | Freq: Four times a day (QID) | ORAL | Status: DC | PRN
  Administered 2023-03-16 – 2023-03-17 (×2): 50 mg via ORAL
  Filled 2023-03-16 (×2): qty 1

## 2023-03-16 MED ORDER — OLANZAPINE 5 MG PO TABS
5.0000 mg | ORAL_TABLET | Freq: Three times a day (TID) | ORAL | Status: DC
  Administered 2023-03-16 – 2023-03-18 (×5): 5 mg via ORAL
  Filled 2023-03-16 (×4): qty 1

## 2023-03-16 NOTE — Progress Notes (Signed)
Patient ID: Tiffany Wong, female   DOB: 1990-11-18, 32 y.o.   MRN: [id number] Tiffany Wong presents with pleasant mood, affect anxious. She reports she is feeling '' continued anxiety but I know I am getting better'' She states she still struggles with racing thoughts and trouble sleeping last night as she '' had my thoughts keeping my up and going and going and trying not to go into a spiral but I'm doing okay'' She has been cooperative, pleasant. Did report increased anxiety and racing thoughts and received prn dose of zyprexa for this. Informed MD she would like to make it scheduled. Pt is safe, denies any SI HI or AV Hallucinations.

## 2023-03-16 NOTE — Group Note (Signed)
Date:  03/16/2023 Time:  9:37 PM  Group Topic/Focus:  Identifying Needs:   The focus of this group is to help patients identify their personal needs that have been historically problematic and identify healthy behaviors to address their needs.    Participation Level:  Active  Participation Quality:  Appropriate and Attentive  Affect:  Appropriate and Excited  Cognitive:  Alert and Appropriate  Insight: Appropriate, Good, and Improving  Engagement in Group:  Developing/Improving and Engaged  Modes of Intervention:  Discussion, Education, Exploration, Rapport Building, Dance movement psychotherapist, Socialization, and Support  Additional Comments:     Nevaan Bunton 03/16/2023, 9:37 PM

## 2023-03-16 NOTE — Group Note (Signed)
Date:  03/16/2023 Time:  10:11 AM  Group Topic/Focus:  Goals Group:   The focus of this group is to help patients establish daily goals to achieve during treatment and discuss how the patient can incorporate goal setting into their daily lives to aide in recovery.    Participation Level:  Active  Participation Quality:  Appropriate  Affect:  Appropriate  Cognitive:  Appropriate  Insight: Good  Engagement in Group:  Engaged  Modes of Intervention:  Activity  Additional Comments:    Tiffany Wong 03/16/2023, 10:11 AM

## 2023-03-16 NOTE — Progress Notes (Signed)
Patient calm and pleasant during assessment denying SI/HI/AVH. Pt observed interacting appropriately with staff and peers on the unit. Pt didn't have any scheduled medications tonight and hasn't requested anything PRN. Pt given education, support, ane encouragement to be active in her treatment plan. Pt being monitored Q 15 minutes for safety per unit protocol, remains safe on the unit

## 2023-03-16 NOTE — Group Note (Signed)
Recreation Therapy Group Note   Group Topic:Relaxation  Group Date: 03/16/2023 Start Time: 1000 End Time: 1045 Facilitators: Rosina Lowenstein, LRT, CTRS Location:  Craft Room  Group Description: Meditation. LRT asks patients their current level of stress/anxiety from 1-10, with 10 being the highest. LRT educated on the benefits of mindfulness and how it can apply to everyday life post-discharge. LRT and pt's followed along to an audio script of a "guided meditation" video. LRT asked pt their level of stress and anxiety once the prompt was finished. LRT facilitated post-activity processing to gain feedback on session.  Goal Area(s) Addressed:  Patient will practice using relaxation technique. Patient will identify a new coping skill.  Patient will follow multistep directions to reduce anxiety and stress.  Affect/Mood: Appropriate   Participation Level: Active and Engaged   Participation Quality: Independent   Behavior: Calm and Cooperative   Speech/Thought Process: Coherent   Insight: Good   Judgement: Good   Modes of Intervention: Activity   Patient Response to Interventions:  Attentive, Engaged, Interested , and Receptive   Education Outcome:  Acknowledges education   Clinical Observations/Individualized Feedback: Tiffany Wong was active in their participation of session activities and group discussion. Pt identified that her anxiety level was a 6 and her stress was a 5.5 before the session. Afterwards, she rated her anxiety a 5 and her stress a 4. Pt wrote feedback that said: "Once I became totally aware of my body, I realized how uncomfortable I am/was. I could feel residual tension." Pt interacted well with LRT and peers duration of session.    Plan: Continue to engage patient in RT group sessions 2-3x/week.   Rosina Lowenstein, LRT, CTRS 03/16/2023 10:57 AM
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
## 2023-03-16 NOTE — Group Note (Signed)
Date:  03/16/2023 Time:  5:40 PM  Group Topic/Focus:  Outdoor Recreation/Activity    Participation Level:  Active  Participation Quality:  Appropriate  Affect:  Appropriate  Cognitive:  Appropriate  Insight: Appropriate  Engagement in Group:  Engaged  Modes of Intervention:  Activity  Additional Comments:    Wilford Corner 03/16/2023, 5:40 PM

## 2023-03-16 NOTE — Progress Notes (Signed)
Patient requested PRN sleep and anxiety medication, check MAR
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
## 2023-03-17 ENCOUNTER — Other Ambulatory Visit: Payer: Self-pay

## 2023-03-17 DIAGNOSIS — F319 Bipolar disorder, unspecified: Secondary | ICD-10-CM | POA: Diagnosis not present

## 2023-03-17 LAB — LIPID PANEL
Cholesterol: 161 mg/dL (ref 0–200)
HDL: 66 mg/dL (ref >40)
LDL Cholesterol: 77 mg/dL (ref 0–99)
Total CHOL/HDL Ratio: 2.4 RATIO
Triglycerides: 88 mg/dL (ref <150)
VLDL: 18 mg/dL (ref 0–40)

## 2023-03-17 LAB — HEMOGLOBIN A1C
Hgb A1c MFr Bld: 5.9 % — ABNORMAL HIGH (ref 4.8–5.6)
Mean Plasma Glucose: 123 mg/dL

## 2023-03-17 MED ORDER — MELATONIN 5 MG PO TABS: 2.5000 mg | ORAL_TABLET | Freq: Every evening | ORAL | 1 refills | Status: DC | PRN

## 2023-03-17 MED ORDER — LAMOTRIGINE 25 MG PO TABS: 25.0000 mg | ORAL_TABLET | Freq: Every day | ORAL | 1 refills | Status: DC

## 2023-03-17 MED ORDER — OLANZAPINE 5 MG PO TABS
5.0000 mg | ORAL_TABLET | Freq: Three times a day (TID) | ORAL | 0 refills | Status: DC
  Filled 2023-03-17: qty 30, 10d supply, fill #0

## 2023-03-17 MED ORDER — MELATONIN 5 MG PO TABS
2.5000 mg | ORAL_TABLET | Freq: Every evening | ORAL | 0 refills | Status: DC | PRN
  Filled 2023-03-17: qty 10, 20d supply, fill #0

## 2023-03-17 MED ORDER — OLANZAPINE 5 MG PO TABS: 5.0000 mg | ORAL_TABLET | Freq: Three times a day (TID) | ORAL | 1 refills | Status: DC

## 2023-03-17 MED ORDER — NICOTINE 14 MG/24HR TD PT24: 14.0000 mg | MEDICATED_PATCH | Freq: Every day | TRANSDERMAL | 1 refills | Status: DC

## 2023-03-17 MED ORDER — BUSPIRONE HCL 10 MG PO TABS
10.0000 mg | ORAL_TABLET | Freq: Three times a day (TID) | ORAL | 0 refills | Status: DC
  Filled 2023-03-17: qty 30, 10d supply, fill #0

## 2023-03-17 MED ORDER — NICOTINE 14 MG/24HR TD PT24
14.0000 mg | MEDICATED_PATCH | Freq: Every day | TRANSDERMAL | 0 refills | Status: DC
  Filled 2023-03-17: qty 14, 14d supply, fill #0

## 2023-03-17 MED ORDER — BUSPIRONE HCL 10 MG PO TABS: 10.0000 mg | ORAL_TABLET | Freq: Three times a day (TID) | ORAL | 1 refills | Status: DC

## 2023-03-17 MED ORDER — HYDROXYZINE HCL 50 MG PO TABS: 50.0000 mg | ORAL_TABLET | Freq: Four times a day (QID) | ORAL | 1 refills | Status: DC | PRN

## 2023-03-17 MED ORDER — LAMOTRIGINE 25 MG PO TABS
25.0000 mg | ORAL_TABLET | Freq: Every day | ORAL | 0 refills | Status: DC
  Filled 2023-03-17: qty 10, 10d supply, fill #0

## 2023-03-17 MED ORDER — HYDROXYZINE HCL 50 MG PO TABS
50.0000 mg | ORAL_TABLET | Freq: Four times a day (QID) | ORAL | 0 refills | Status: DC | PRN
  Filled 2023-03-17: qty 20, 5d supply, fill #0

## 2023-03-17 NOTE — Group Note (Signed)
Recreation Therapy Group Note   Group Topic:Coping Skills  Group Date: 03/17/2023 Start Time: 1015 End Time: 1120 Facilitators: Rosina Lowenstein, LRT, CTRS Location:  [facility]  Group Description: Mind Map.  Patient was provided a blank template of a diagram with 32 blank boxes in a tiered system, branching from the center (similar to a bubble chart). LRT directed patients to label the middle of the diagram "Coping Skills". LRT and patients then came up with 8 different coping skills as examples. Pt were directed to record their coping skills in the 2nd tier boxes closest to the center.  Patients would then share their coping skills with the group as LRT wrote them out. LRT gave a handout of 100 different coping skills at the end of group.   Goal Area(s) Addressed: Patients will be able to define "coping skills". Patient will identify new coping skills.  Patient will identify new possible leisure interests.   Affect/Mood: Appropriate, Full range, and Happy   Participation Level: Active and Engaged   Participation Quality: Independent   Behavior: Calm, Cooperative, and Eager   Speech/Thought Process: Coherent   Insight: Good   Judgement: Good   Modes of Intervention: Guided Discussion and Worksheet   Patient Response to Interventions:  Attentive, Engaged, Interested , and Receptive   Education Outcome:  Acknowledges education   Clinical Observations/Individualized Feedback: Amberlie was active in their participation of session activities and group discussion. Pt identified "meditation, breathing, cleaning, and setting healthy boundaries" as coping skills on her paper. Pt spontaneously contributed to group discussion on more than one occasion. Pt interacted well with LRT and peers duration of session.   Plan: Continue to engage patient in RT group sessions 2-3x/week.   Rosina Lowenstein, LRT, CTRS 03/17/2023 11:43 AM

## 2023-03-17 NOTE — Group Note (Signed)
Date:  03/17/2023 Time:  10:43 PM  Group Topic/Focus:  Wrap-Up Group:   The focus of this group is to help patients review their daily goal of treatment and discuss progress on daily workbooks.    Participation Level:  Active  Participation Quality:  Appropriate and Attentive  Affect:  Appropriate  Cognitive:  Alert and Appropriate  Insight: Good  Engagement in Group:  Developing/Improving and Engaged  Modes of Intervention:  Discussion, Problem-solving, Socialization, and Support  Additional Comments:     Nicolena Schurman 03/17/2023, 10:43 PM

## 2023-03-17 NOTE — Plan of Care (Signed)
  Problem: Education: Goal: Knowledge of General Education information will improve Description: Including pain rating scale, medication(s)/side effects and non-pharmacologic comfort measures Outcome: Progressing   Problem: Activity: Goal: Risk for activity intolerance will decrease Outcome: Progressing   Problem: Nutrition: Goal: Adequate nutrition will be maintained Outcome: Progressing   Problem: Coping: Goal: Level of anxiety will decrease Outcome: Progressing   
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
## 2023-03-17 NOTE — Group Note (Signed)
Date:  03/17/2023 Time:  6:21 PM  Group Topic/Focus:  Outdoor Recreation/Activity    Participation Level:  Active  Participation Quality:  Appropriate  Affect:  Appropriate  Cognitive:  Appropriate  Insight: Appropriate  Engagement in Group:  Engaged  Modes of Intervention:  Activity  Additional Comments:    Shayma Pfefferle Travis Bertil Brickey 03/17/2023, 6:21 PM  
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
## 2023-03-17 NOTE — Progress Notes (Signed)
Patient denies SI, HI, and AVH. She is in good spirits and is observed interacting appropriately with staff and other patients. She is compliant with scheduled medications and remains safe on the unit at this time.

## 2023-03-17 NOTE — BHH Group Notes (Signed)
  Date:  03/17/2023 Time:  10:33 AM   Group Topic/Focus: Crisis Management  Identifying Needs:   Identifying and addressing what crisis looks and feels like.        Participation Level:  Active   Participation Quality:  Appropriate and Attentive   Affect:  Appropriate and Excited   Cognitive:  Alert and Appropriate   Insight: Appropriate, Good, and Improving   Engagement in Group:  Developing/Improving and Engaged   Modes of Intervention:  Discussion, Education, Exploration, Rapport Building, Dance movement psychotherapist, Socialization, and Support   Additional Comments:

## 2023-03-17 NOTE — Progress Notes (Signed)
Patient calm and pleasant during assessment denying SI/HI/AVH. Pt observed interacting appropriately with staff and peers on the unit. Pt compliant with medication administration per MD orders. Pt given education, support, ane encouragement to be active in her treatment plan. Pt being monitored Q 15 minutes for safety per unit protocol, remains safe on the unit

## 2023-03-18 ENCOUNTER — Other Ambulatory Visit: Payer: Self-pay

## 2023-03-18 DIAGNOSIS — F319 Bipolar disorder, unspecified: Secondary | ICD-10-CM | POA: Diagnosis not present

## 2023-03-18 NOTE — Progress Notes (Signed)
  [facility] Adult Case Management Discharge Plan :  Will you be returning to the same living situation after discharge:  Yes,  pt plans to return home upon discharge. At discharge, do you have transportation home?: Yes,  CSW to assist with transportation arrangements. Do you have the ability to pay for your medications: Yes,  [facility].  Release of information consent forms completed and in the chart;  Patient's signature needed at discharge.  Patient to Follow up at:  Follow-up Information     [facility] [employer]. Schedule an appointment as soon as possible for a visit.   Specialty: Professional Counselor Why: Your next appointment is scheduled for Friday, 03/25/23 at 10AM. Thanks! Contact information: [facility] [place] [address] [place] [address] [phone]                 Next level of care provider has access to [facility] Link:no  Safety Planning and Suicide Prevention discussed: Yes,  SPE completed with pt.      Has patient been referred to the Quitline?: Patient refused referral for treatment  Patient has been referred for addiction treatment: Yes, the patient will follow up with an outpatient provider for substance use disorder. Therapist: appointment made  Glenis Smoker, LCSW 03/18/2023, 1:43 PM
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
## 2023-03-18 NOTE — BHH Suicide Risk Assessment (Signed)
[facility] Discharge Suicide Risk Assessment   Principal Problem: Bipolar 1 disorder [facility]) Discharge Diagnoses: Principal Problem:   Bipolar 1 disorder (HCC)   Total Time spent with patient: 30 minutes  Musculoskeletal: Strength & Muscle Tone: within normal limits Gait & Station: normal Patient leans: N/A  Psychiatric Specialty Exam  Presentation  General Appearance:  Appropriate for Environment; Casual  Eye Contact: Good  Speech: Clear and Coherent; Normal Rate  Speech Volume: Normal  Handedness: Right   Mood and Affect  Mood: Anxious; Depressed  Duration of Depression Symptoms: Greater than two weeks  Affect: Congruent; Tearful   Thought Process  Thought Processes: Coherent  Descriptions of Associations:Intact  Orientation:Full (Time, Place and Person)  Thought Content:Logical  History of Schizophrenia/Schizoaffective disorder:No  Duration of Psychotic Symptoms:No data recorded Hallucinations:No data recorded Ideas of Reference:None  Suicidal Thoughts:No data recorded Homicidal Thoughts:No data recorded  Sensorium  Memory: Immediate Good; Recent Good; Remote Good  Judgment: Intact  Insight: Fair   Art therapist  Concentration: Good  Attention Span: Good  Recall: Good  Fund of Knowledge: Good  Language: Good   Psychomotor Activity  Psychomotor Activity:No data recorded  Assets  Assets: Communication Skills; Desire for Improvement; Physical Health; Resilience; Social Support; Health and safety inspector; Housing   Sleep  Sleep:No data recorded  Physical Exam: Physical Exam Vitals and nursing note reviewed.  Constitutional:      Appearance: Normal appearance.  HENT:     Head: Normocephalic and atraumatic.     Mouth/Throat:     Pharynx: Oropharynx is clear.  Eyes:     Pupils: Pupils are equal, round, and reactive to light.  Cardiovascular:     Rate and Rhythm: Normal rate and regular rhythm.  Pulmonary:      Effort: Pulmonary effort is normal.     Breath sounds: Normal breath sounds.  Abdominal:     General: Abdomen is flat.     Palpations: Abdomen is soft.  Musculoskeletal:        General: Normal range of motion.  Skin:    General: Skin is warm and dry.  Neurological:     General: No focal deficit present.     Mental Status: She is alert. Mental status is at baseline.  Psychiatric:        Attention and Perception: Attention normal.        Mood and Affect: Mood normal.        Speech: Speech normal.        Behavior: Behavior normal.        Thought Content: Thought content normal.        Cognition and Memory: Cognition normal.        Judgment: Judgment normal.    Review of Systems  Constitutional: Negative.   HENT: Negative.    Eyes: Negative.   Respiratory: Negative.    Cardiovascular: Negative.   Gastrointestinal: Negative.   Musculoskeletal: Negative.   Skin: Negative.   Neurological: Negative.   Psychiatric/Behavioral: Negative.     Blood pressure (!) 95/45, pulse 78, temperature 97.9 F (36.6 C), temperature source Oral, resp. rate 19, height 5\' 1"  (1.549 m), weight 41.5 kg, SpO2 100 %, currently breastfeeding. Body mass index is 17.29 kg/m.  Mental Status Per Nursing Assessment::   On Admission:  NA  Demographic Factors:  NA  Loss Factors: NA  Historical Factors: NA  Risk Reduction Factors:   Responsible for children under 32 years of age, Employed, Living with another person, especially a relative, Positive social support, and  Positive therapeutic relationship  Continued Clinical Symptoms:  Bipolar Disorder:   Mixed State Depression:   Impulsivity  Cognitive Features That Contribute To Risk:  None    Suicide Risk:  Minimal: No identifiable suicidal ideation.  Patients presenting with no risk factors but with morbid ruminations; may be classified as minimal risk based on the severity of the depressive symptoms   Follow-up Information     [facility] Schedule an appointment as soon as possible for a visit.   Specialty: Professional Counselor Why: Your next appointment is scheduled for Friday, 03/25/23 at 10AM. Thanks! Contact information: [facility] [place] [address] [place] [address] [phone]                 Plan Of Care/Follow-up recommendations:  Other:  Patient denies all suicidal ideation and appears to have an upbeat and positive affect.  Has shown no dangerous behavior in the hospital.  Has good insight and is cooperative with treatment and will be given prescriptions for medicine and referral for outpatient follow-up.  Mordecai Rasmussen, MD 03/18/2023, 10:17 AM

## 2023-03-18 NOTE — Group Note (Signed)
Recreation Therapy Group Note   Group Topic:Leisure Education  Group Date: 03/18/2023 Start Time: 1000 End Time: 1100 Facilitators: Rosina Lowenstein, LRT, CTRS Location:  Craft Room  Group Description: Leisure. Patients were given the option to choose from singing karaoke, making origami, using oil pastels, coloring mandalas, or playing UNO. LRT and pts discussed the meaning of leisure, the importance of participating in leisure during their free time/when they're outside of the hospital, as well as how our leisure interests can also serve as coping skills. Pt identified two leisure interests and shared with the group.   Goal Area(s) Addressed:  Patient will identify a current leisure interest.  Patient will learn the definition of "leisure". Patient will practice making a positive decision. Patient will have the opportunity to try a new leisure activity. Patient will communicate with peers and LRT.   Affect/Mood: Appropriate and Happy   Participation Level: Active and Engaged   Participation Quality: Independent   Behavior: Calm, Cooperative, and Eager   Speech/Thought Process: Coherent   Insight: Good   Judgement: Good   Modes of Intervention: Activity   Patient Response to Interventions:  Attentive, Engaged, Interested , and Receptive   Education Outcome:  Acknowledges education   Clinical Observations/Individualized Feedback: Tiffany Wong was active in their participation of session activities and group discussion. Pt identified "sing, dance, and take my kids to the park" as things she does in her free time. Pt chose to sing karaoke and color with oil pastels while in group. Pt interacted well with LRT and peers duration of session.   Plan: Continue to engage patient in RT group sessions 2-3x/week.   Rosina Lowenstein, LRT, CTRS 03/18/2023 11:40 AM

## 2023-03-18 NOTE — Progress Notes (Signed)
Patient pleasant and cooperative on approach. Denies SI,HI and AVH. Verbalized understanding discharge instructions,prescriptions and follow up care. 7 days medicines given to patient. All belongings returned from BMU locker. Suicide safety plan filled by patient and placed in chart. Copy given to patient.Patient escorted out by staff and transported by cab. 
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
## 2023-03-25 ENCOUNTER — Ambulatory Visit (INDEPENDENT_AMBULATORY_CARE_PROVIDER_SITE_OTHER): Payer: Medicaid Other | Admitting: Psychiatry

## 2023-03-25 ENCOUNTER — Encounter (HOSPITAL_COMMUNITY): Payer: Self-pay | Admitting: Psychiatry

## 2023-03-25 VITALS — BP 106/66 | HR 98 | Wt 106.0 lb

## 2023-03-25 DIAGNOSIS — Z720 Tobacco use: Secondary | ICD-10-CM | POA: Diagnosis not present

## 2023-03-25 DIAGNOSIS — F411 Generalized anxiety disorder: Secondary | ICD-10-CM

## 2023-03-25 DIAGNOSIS — F1210 Cannabis abuse, uncomplicated: Secondary | ICD-10-CM

## 2023-03-25 DIAGNOSIS — F319 Bipolar disorder, unspecified: Secondary | ICD-10-CM

## 2023-03-25 DIAGNOSIS — Z79899 Other long term (current) drug therapy: Secondary | ICD-10-CM

## 2023-03-25 MED ORDER — LAMOTRIGINE 25 MG PO TABS
ORAL_TABLET | ORAL | 0 refills | Status: DC
Start: 2023-03-25 — End: 2023-06-06

## 2023-03-25 MED ORDER — OLANZAPINE 5 MG PO TABS
ORAL_TABLET | ORAL | 1 refills | Status: DC
Start: 2023-03-25 — End: 2023-06-06

## 2023-03-25 MED ORDER — BUSPIRONE HCL 10 MG PO TABS
10.0000 mg | ORAL_TABLET | Freq: Three times a day (TID) | ORAL | 1 refills | Status: DC
Start: 2023-03-25 — End: 2023-06-06
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
## 2023-03-28 ENCOUNTER — Encounter (HOSPITAL_COMMUNITY): Payer: Self-pay | Admitting: Psychiatry

## 2023-03-29 DIAGNOSIS — Z79899 Other long term (current) drug therapy: Secondary | ICD-10-CM | POA: Insufficient documentation

## 2023-03-29 NOTE — Addendum Note (Signed)
Addended by: Tia Masker on: 03/29/2023 12:13 PM   Modules accepted: Level of Service

## 2023-05-04 ENCOUNTER — Telehealth (HOSPITAL_COMMUNITY): Payer: Self-pay | Admitting: Psychiatry

## 2023-05-15 ENCOUNTER — Emergency Department (HOSPITAL_COMMUNITY): Payer: MEDICAID

## 2023-05-15 ENCOUNTER — Other Ambulatory Visit: Payer: Self-pay

## 2023-05-15 ENCOUNTER — Emergency Department (HOSPITAL_COMMUNITY)
Admission: EM | Admit: 2023-05-15 | Discharge: 2023-05-15 | Disposition: A | Discharge status: Home / Self Care | Payer: MEDICAID | Attending: Emergency Medicine | Admitting: Emergency Medicine

## 2023-05-15 DIAGNOSIS — L03213 Periorbital cellulitis: Secondary | ICD-10-CM | POA: Diagnosis not present

## 2023-05-15 DIAGNOSIS — H5789 Other specified disorders of eye and adnexa: Secondary | ICD-10-CM | POA: Diagnosis present

## 2023-05-15 LAB — CBC WITH DIFFERENTIAL/PLATELET
Abs Immature Granulocytes: 0.01 10*3/uL (ref 0.00–0.07)
Basophils Absolute: 0 10*3/uL (ref 0.0–0.1)
Basophils Relative: 1 %
Eosinophils Absolute: 0.3 10*3/uL (ref 0.0–0.5)
Eosinophils Relative: 5 %
HCT: 37.9 % (ref 36.0–46.0)
Hemoglobin: 12.3 g/dL (ref 12.0–15.0)
Immature Granulocytes: 0 %
Lymphocytes Relative: 34 %
Lymphs Abs: 1.7 10*3/uL (ref 0.7–4.0)
MCH: 29.4 pg (ref 26.0–34.0)
MCHC: 32.5 g/dL (ref 30.0–36.0)
MCV: 90.7 fL (ref 80.0–100.0)
Monocytes Absolute: 0.5 10*3/uL (ref 0.1–1.0)
Monocytes Relative: 11 %
Neutro Abs: 2.5 10*3/uL (ref 1.7–7.7)
Neutrophils Relative %: 49 %
Platelets: 210 10*3/uL (ref 150–400)
RBC: 4.18 MIL/uL (ref 3.87–5.11)
RDW: 12.8 % (ref 11.5–15.5)
WBC: 5 10*3/uL (ref 4.0–10.5)
nRBC: 0 % (ref 0.0–0.2)

## 2023-05-15 LAB — BASIC METABOLIC PANEL
Anion gap: 10 (ref 5–15)
BUN: 10 mg/dL (ref 6–20)
CO2: 22 mmol/L (ref 22–32)
Calcium: 8.8 mg/dL — ABNORMAL LOW (ref 8.9–10.3)
Chloride: 105 mmol/L (ref 98–111)
Creatinine, Ser: 0.62 mg/dL (ref 0.44–1.00)
GFR, Estimated: >60 mL/min (ref >60)
Glucose, Bld: 94 mg/dL (ref 70–99)
Potassium: 3.5 mmol/L (ref 3.5–5.1)
Sodium: 137 mmol/L (ref 135–145)

## 2023-05-15 LAB — HCG, SERUM, QUALITATIVE: Preg, Serum: NEGATIVE

## 2023-05-15 MED ORDER — TETRACAINE HCL 0.5 % OP SOLN
2.0000 [drp] | Freq: Once | OPHTHALMIC | Status: AC
  Administered 2023-05-15: 2 [drp] via OPHTHALMIC
  Filled 2023-05-15: qty 4

## 2023-05-15 MED ORDER — ACETAMINOPHEN 500 MG PO TABS
1000.0000 mg | ORAL_TABLET | Freq: Once | ORAL | Status: AC
  Administered 2023-05-15: 1000 mg via ORAL
  Filled 2023-05-15: qty 2

## 2023-05-15 MED ORDER — FLUORESCEIN SODIUM 1 MG OP STRP
1.0000 | ORAL_STRIP | Freq: Once | OPHTHALMIC | Status: AC
  Administered 2023-05-15: 1 via OPHTHALMIC
  Filled 2023-05-15: qty 1

## 2023-05-15 MED ORDER — AMOXICILLIN-POT CLAVULANATE 875-125 MG PO TABS: 1.0000 | ORAL_TABLET | Freq: Two times a day (BID) | ORAL | 0 refills | Status: DC

## 2023-05-15 MED ORDER — IOHEXOL 300 MG/ML  SOLN
75.0000 mL | Freq: Once | INTRAMUSCULAR | Status: AC | PRN
  Administered 2023-05-15: 75 mL via INTRAVENOUS

## 2023-05-15 MED ORDER — SULFAMETHOXAZOLE-TRIMETHOPRIM 800-160 MG PO TABS
1.0000 | ORAL_TABLET | Freq: Two times a day (BID) | ORAL | 0 refills | Status: AC
Start: 2023-05-15 — End: 2023-05-22

## 2023-05-15 NOTE — Discharge Instructions (Addendum)
Please follow-up with the eye doctor I have attached your for you.  Today your CT scan shows that you have preseptal cellulitis which is a skin infection around the eye that will need antibiotics.  Please take the antibiotics I prescribed and you may use Tylenol every 6 hours needed for pain.  If symptoms change or worsen please return to ER.

## 2023-05-15 NOTE — ED Provider Notes (Signed)
[facility] AT [facility] Provider Note   CSN: [id number] Arrival date & time: 05/15/23  1135     History  Chief Complaint  Patient presents with   Eye Problem   Conjunctivitis    Tiffany Wong is a 32 y.o. female presented with right eye swelling and itching.  Patient states that she recently had sinus irritation most likely due to allergies but last Thursday began having right eye itching and redness that has now progressed to swelling around her eye with redness.  Patient states that hurts to move her eye up and down but that she does not have any vision changes.  Patient denies any fevers, headache, neck pain.  Patient does not wear contacts.    Home Medications Prior to Admission medications   Medication Sig Start Date End Date Taking? Authorizing Provider  amoxicillin-clavulanate (AUGMENTIN) 875-125 MG tablet Take 1 tablet by mouth every 12 (twelve) hours. 05/15/23  Yes Yeily Link, Beverly Gust, PA-C  sulfamethoxazole-trimethoprim (BACTRIM DS) 800-160 MG tablet Take 1 tablet by mouth 2 (two) times daily for 7 days. 05/15/23 05/22/23 Yes Darlene Brozowski, Beverly Gust, PA-C  busPIRone (BUSPAR) 10 MG tablet Take 1 tablet (10 mg total) by mouth with breakfast, with lunch, and with evening meal. 03/25/23   Karsten Ro, MD  hydrOXYzine (ATARAX) 50 MG tablet Take 1 tablet (50 mg total) by mouth every 6 (six) hours as needed for anxiety. 03/17/23   [name], [name], MD  lamoTRIgine (LAMICTAL) 25 MG tablet Take 2 tablets (50 mg total) by mouth daily for 14 days, THEN 4 tablets (100 mg total) daily. 03/25/23 06/07/23  Karsten Ro, MD  melatonin 5 MG TABS Take 0.5 tablets (2.5 mg total) by mouth at bedtime as needed. 03/17/23   [name], [name], MD  nicotine (NICODERM CQ - DOSED IN MG/24 HOURS) 14 mg/24hr patch Place 1 patch (14 mg total) onto the skin daily. 03/18/23   [name], [name], MD  OLANZapine (ZYPREXA) 5 MG tablet Take 1 tablet (5 mg total) by mouth daily AND 2 tablets (10 mg  total) at bedtime. 03/25/23   Karsten Ro, MD  metoCLOPramide (REGLAN) 10 MG tablet Take 1 tablet (10 mg total) by mouth every 6 (six) hours as needed for refractory nausea / vomiting. Patient not taking: No sig reported 02/16/21   Renne Crigler, PA-C      Allergies    Aspirin    Review of Systems   Review of Systems  Physical Exam Updated Vital Signs BP 99/65   Pulse 66   Temp 98 F (36.7 C) (Oral)   Resp 16   Ht 5\' 1"  (1.549 m)   Wt 48.1 kg   SpO2 100%   BMI 20.03 kg/m  Physical Exam Constitutional:      General: She is not in acute distress. Eyes:     General: Lids are normal. Vision grossly intact.        Right eye: No foreign body, discharge or hordeolum.     Extraocular Movements:     Right eye: No nystagmus.     Conjunctiva/sclera:     Right eye: Right conjunctiva is injected.     Pupils: Pupils are equal, round, and reactive to light.     Right eye: No corneal abrasion or fluorescein uptake. Seidel exam negative.     Comments: Painful EOM when moving and vertical directions No ocular discharge Eyeball soft and nonrigid Right eye: Erythema and edema around right eye Vision grossly intact  Neurological:     Mental Status: She is alert.     ED Results / Procedures / Treatments   Labs (all labs ordered are listed, but only abnormal results are displayed) Labs Reviewed  BASIC METABOLIC PANEL - Abnormal; Notable for the following components:      Result Value   Calcium 8.8 (*)    All other components within normal limits  CBC WITH DIFFERENTIAL/PLATELET  HCG, SERUM, QUALITATIVE    EKG None  Radiology CT Orbits W Contrast  Result Date: 05/15/2023 CLINICAL DATA:  Provided history: Periorbital cellulitis. Right eye redness, itching and pain. Swelling of eyelid. EXAM: CT ORBITS WITH CONTRAST TECHNIQUE: Multidetector CT images was performed according to the standard protocol following intravenous contrast administration. RADIATION DOSE REDUCTION: This exam  was performed according to the departmental dose-optimization program which includes automated exposure control, adjustment of the mA and/or kV according to patient size and/or use of iterative reconstruction technique. CONTRAST:  75mL OMNIPAQUE IOHEXOL 300 MG/ML  SOLN COMPARISON:  None. FINDINGS: Orbits: Right periorbital soft tissue swelling. The globes are normal in size and contour. The extraocular muscles, optic nerve sheath complexes and lacrimal glands are symmetric and unremarkable. No CT evidence of post-septal orbital cellulitis. Visible paranasal sinuses: No significant paranasal sinus disease at the imaged levels. Soft tissues: Right periorbital soft tissue swelling. Osseous: No acute fracture or aggressive osseous lesion. Limited intracranial: No evidence of an acute intracranial abnormality within the field of view. IMPRESSION: Right periorbital soft tissue swelling compatible with periorbital cellulitis in the appropriate clinical setting. Correlate with physical exam findings. No CT evidence of post-septal orbital cellulitis. Electronically Signed   By: Jackey Loge D.O.   On: 05/15/2023 14:00    Procedures Procedures    Medications Ordered in ED Medications  tetracaine (PONTOCAINE) 0.5 % ophthalmic solution 2 drop (2 drops Right Eye Given by Other 05/15/23 1338)  fluorescein ophthalmic strip 1 strip (1 strip Right Eye Given by Other 05/15/23 1339)  acetaminophen (TYLENOL) tablet 1,000 mg (1,000 mg Oral Given 05/15/23 1236)  iohexol (OMNIPAQUE) 300 MG/ML solution 75 mL (75 mLs Intravenous Contrast Given 05/15/23 1335)    ED Course/ Medical Decision Making/ A&P                             Medical Decision Making Amount and/or Complexity of Data Reviewed Labs: ordered. Radiology: ordered.  Risk OTC drugs. Prescription drug management.   Tiffany Wong 32 y.o. presented today for right erythema and edema. Working DDx that I considered at this time includes, but not limited to,  preorbital/orbital cellulitis, acute glaucoma, HSV infection, open globe, conjunctivitis, hordeolum/chalazion, FB, CRAO/CRVO.  R/o DDx: orbital cellulitis, acute glaucoma, HSV infection, open globe, conjunctivitis, hordeolum/chalazion, FB, CRAO/CRVO: These are considered less likely due to history of present illness and physical exam findings  Review of prior external notes: 03/12/2023 discharge  Unique Tests and My Interpretation:  Visual Acuity: Right eye: 20/200, left eye: 20/200, bilateral 20/200 Fluoroscein Stain: No fluorescein reuptake, no dendritic lesions  Discussion with Independent Historian: None  Discussion of Management of Tests: None  Risk: Medium: prescription drug management  Risk Stratification Score: None  Plan: On exam patient was in no acute distress stable vitals.  Patient's vision was grossly intact however patient did have painful EOM when looking up and down.  Patient's visual acuity was reassuring along with her fluorescein stain.  Unable to test intraocular pressure as patient cannot keep the eye  open due to discomfort.  Patient does endorse recent sinus symptoms which may have led to her swollen right eye.  CT scan shows preseptal cellulitis and patient will be treated with Augmentin and Bactrim.  Patient is not currently breast-feeding or pregnant and so Bactrim is safe for her.  Patient given ophthalmology follow-up and encouraged return to ER if symptoms change or worsen.  Patient was given return precautions. Patient stable for discharge at this time.  Patient verbalized understanding of plan.         Final Clinical Impression(s) / ED Diagnoses Final diagnoses:  Preseptal cellulitis of right eye    Rx / DC Orders ED Discharge Orders          Ordered    amoxicillin-clavulanate (AUGMENTIN) 875-125 MG tablet  Every 12 hours        05/15/23 1410    sulfamethoxazole-trimethoprim (BACTRIM DS) 800-160 MG tablet  2 times daily        05/15/23 1410               Netta Corrigan, PA-C 05/15/23 1417    Melene Plan, DO 05/15/23 1508

## 2023-05-15 NOTE — ED Notes (Signed)
Visual Acuity Screening   Left eye 20/200  Right eye 20/200  Both eyes 20/200

## 2023-05-15 NOTE — ED Triage Notes (Signed)
Pt arrived via POV. C/o R eye redness, itching, and pain. No drainage. Eyelid is swollen

## 2023-05-20 ENCOUNTER — Encounter (HOSPITAL_COMMUNITY): Payer: Medicaid Other | Admitting: Psychiatry
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
## 2023-06-06 ENCOUNTER — Telehealth (INDEPENDENT_AMBULATORY_CARE_PROVIDER_SITE_OTHER): Payer: MEDICAID | Admitting: Psychiatry

## 2023-06-06 DIAGNOSIS — F319 Bipolar disorder, unspecified: Secondary | ICD-10-CM | POA: Diagnosis not present

## 2023-06-06 MED ORDER — OLANZAPINE 5 MG PO TABS
ORAL_TABLET | ORAL | 0 refills | Status: DC
Start: 2023-06-06 — End: 2023-06-14

## 2023-06-06 MED ORDER — LAMOTRIGINE 100 MG PO TABS
100.0000 mg | ORAL_TABLET | Freq: Every day | ORAL | 0 refills | Status: DC
Start: 2023-06-06 — End: 2023-06-14

## 2023-06-06 MED ORDER — BUSPIRONE HCL 10 MG PO TABS
10.0000 mg | ORAL_TABLET | Freq: Three times a day (TID) | ORAL | 0 refills | Status: DC
Start: 2023-06-06 — End: 2023-06-14

## 2023-06-06 NOTE — Patient Instructions (Signed)

## 2023-06-14 ENCOUNTER — Telehealth (HOSPITAL_COMMUNITY): Payer: Self-pay | Admitting: Psychiatry

## 2023-06-14 ENCOUNTER — Other Ambulatory Visit (HOSPITAL_COMMUNITY): Payer: Self-pay | Admitting: Psychiatry

## 2023-06-14 ENCOUNTER — Telehealth (HOSPITAL_COMMUNITY): Payer: Self-pay

## 2023-06-14 DIAGNOSIS — F319 Bipolar disorder, unspecified: Secondary | ICD-10-CM

## 2023-06-14 MED ORDER — BUSPIRONE HCL 10 MG PO TABS
10.0000 mg | ORAL_TABLET | Freq: Three times a day (TID) | ORAL | 0 refills | Status: AC
Start: 2023-06-14 — End: 2023-07-14

## 2023-06-14 MED ORDER — OLANZAPINE 5 MG PO TABS
ORAL_TABLET | ORAL | 0 refills | Status: DC
Start: 2023-06-14 — End: 2023-07-27

## 2023-06-14 MED ORDER — LAMOTRIGINE 100 MG PO TABS: 100.0000 mg | ORAL_TABLET | Freq: Every day | ORAL | 0 refills | Status: DC

## 2023-06-15 ENCOUNTER — Telehealth (HOSPITAL_COMMUNITY): Payer: MEDICAID | Admitting: Psychiatry
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
## 2023-07-27 ENCOUNTER — Telehealth (HOSPITAL_COMMUNITY): Payer: MEDICAID | Admitting: Psychiatry

## 2023-07-27 ENCOUNTER — Encounter (HOSPITAL_COMMUNITY): Payer: Self-pay | Admitting: Psychiatry

## 2023-07-27 DIAGNOSIS — F411 Generalized anxiety disorder: Secondary | ICD-10-CM

## 2023-07-27 DIAGNOSIS — F4310 Post-traumatic stress disorder, unspecified: Secondary | ICD-10-CM | POA: Diagnosis not present

## 2023-07-27 DIAGNOSIS — F1290 Cannabis use, unspecified, uncomplicated: Secondary | ICD-10-CM | POA: Diagnosis not present

## 2023-07-27 DIAGNOSIS — F319 Bipolar disorder, unspecified: Secondary | ICD-10-CM

## 2023-07-27 MED ORDER — LAMOTRIGINE 100 MG PO TABS: 100.0000 mg | ORAL_TABLET | Freq: Every day | ORAL | 1 refills | Status: DC

## 2023-07-27 MED ORDER — OLANZAPINE 5 MG PO TABS
5.0000 mg | ORAL_TABLET | Freq: Three times a day (TID) | ORAL | 1 refills | Status: DC
Start: 2023-07-27 — End: 2023-09-09

## 2023-07-27 MED ORDER — BUSPIRONE HCL 10 MG PO TABS: 10.0000 mg | ORAL_TABLET | Freq: Three times a day (TID) | ORAL | 1 refills | Status: DC

## 2023-07-27 NOTE — Patient Instructions (Signed)
Thank you for attending your appointment today.  -- RETURN to taking Zyprexa 5 mg three times daily -- RETURN to taking Buspar 10 mg three times daily -- Continue other medications as prescribed.  Please do not make any changes to medications without first discussing with your provider. If you are experiencing a psychiatric emergency, please call 911 or present to your nearest emergency department. Additional crisis, medication management, and therapy resources are included below.  [facility]  [address], [place], [place] [address] [phone] WALK-IN URGENT CARE 24/7 FOR ANYONE [address], [place], [place]  [phone] Fax: [phone] [email or web address] *Interpreters available *Accepts all insurance and uninsured for Urgent Care needs *Accepts Medicaid and uninsured for outpatient treatment (below)      ONLY FOR [facility]  Below:    Outpatient New Patient Assessment/Therapy Walk-ins:        Monday -Thursday 8am until slots are full.        Every Friday 1pm-4pm  (first come, first served)                   New Patient Psychiatry/Medication Management        Monday-Friday 8am-11am (first come, first served)               For all walk-ins we ask that you arrive by 7:15am, because patients will be seen in the order of arrival.

## 2023-07-29 ENCOUNTER — Encounter (HOSPITAL_COMMUNITY): Payer: Self-pay | Admitting: Psychiatry

## 2023-07-29 ENCOUNTER — Telehealth (HOSPITAL_COMMUNITY): Payer: Self-pay | Admitting: *Deleted

## 2023-07-29 ENCOUNTER — Telehealth (HOSPITAL_COMMUNITY): Payer: Self-pay

## 2023-07-29 NOTE — Telephone Encounter (Signed)
Pharmacy called asking for different dosage of medication. Asking for 15mg  of Olanzapine instead of 5mg  TID. I explained that I would try for a prior authorization before a medication change would be submitted. Called for prior authorization of Olanzapine. Spoke with Tiffany Wong who took information and states a decision will be faxed. Case ID [id number].

## 2023-08-02 ENCOUNTER — Telehealth (HOSPITAL_COMMUNITY): Payer: Self-pay | Admitting: *Deleted

## 2023-08-02 NOTE — Telephone Encounter (Signed)
Yes the three pills a day

## 2023-08-04 ENCOUNTER — Telehealth (HOSPITAL_COMMUNITY): Payer: Self-pay | Admitting: *Deleted

## 2023-08-04 NOTE — Telephone Encounter (Signed)
Called pharmacy to check status of Olanzapine. They state the patient picked up the medication yesterday so the prior authorization must have been approved. This Clinical research associate never received a fax.

## 2023-08-31 ENCOUNTER — Other Ambulatory Visit (HOSPITAL_COMMUNITY): Payer: Self-pay | Admitting: Psychiatry

## 2023-08-31 DIAGNOSIS — F319 Bipolar disorder, unspecified: Secondary | ICD-10-CM
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
## 2023-09-09 ENCOUNTER — Encounter (HOSPITAL_COMMUNITY): Payer: Self-pay | Admitting: Psychiatry

## 2023-09-09 ENCOUNTER — Telehealth (HOSPITAL_COMMUNITY): Payer: MEDICAID | Admitting: Psychiatry

## 2023-09-09 DIAGNOSIS — R5383 Other fatigue: Secondary | ICD-10-CM

## 2023-09-09 DIAGNOSIS — F1290 Cannabis use, unspecified, uncomplicated: Secondary | ICD-10-CM

## 2023-09-09 DIAGNOSIS — F319 Bipolar disorder, unspecified: Secondary | ICD-10-CM

## 2023-09-09 MED ORDER — BUSPIRONE HCL 10 MG PO TABS: 10.0000 mg | ORAL_TABLET | Freq: Three times a day (TID) | ORAL | 1 refills | Status: DC

## 2023-09-09 MED ORDER — LAMOTRIGINE 150 MG PO TABS
150.0000 mg | ORAL_TABLET | Freq: Every day | ORAL | 1 refills | Status: DC
Start: 2023-09-09 — End: 2023-10-31

## 2023-09-09 MED ORDER — OLANZAPINE 5 MG PO TABS
5.0000 mg | ORAL_TABLET | Freq: Three times a day (TID) | ORAL | 1 refills | Status: DC
Start: 2023-09-09 — End: 2023-10-31

## 2023-09-09 NOTE — Patient Instructions (Signed)
Thank you for attending your appointment today.  -- INCREASE lamotrigine to 150 mg daily -- Continue other medications as prescribed.  Please do not make any changes to medications without first discussing with your provider. If you are experiencing a psychiatric emergency, please call 911 or present to your nearest emergency department. Additional crisis, medication management, and therapy resources are included below.  [facility]  [address], [place], [place] [address] [phone] WALK-IN URGENT CARE 24/7 FOR ANYONE [address] [place], [place]  [phone] Fax: [phone] [email or web address] *Interpreters available *Accepts all insurance and uninsured for Urgent Care needs *Accepts Medicaid and uninsured for outpatient treatment (below)      ONLY FOR [facility]  Below:    Outpatient New Patient Assessment/Therapy Walk-ins:        Monday, Wednesday, and Thursday 8am until slots are full (first come, first served)                   New Patient Psychiatry/Medication Management        Monday-Friday 8am-11am (first come, first served)               For all walk-ins we ask that you arrive by 7:15am, because patients will be seen in the order of arrival.

## 2023-09-19 ENCOUNTER — Telehealth (HOSPITAL_COMMUNITY): Payer: Self-pay

## 2023-09-19 ENCOUNTER — Other Ambulatory Visit (HOSPITAL_COMMUNITY): Payer: MEDICAID

## 2023-09-19 ENCOUNTER — Encounter (HOSPITAL_COMMUNITY): Payer: Self-pay

## 2023-09-19 NOTE — Telephone Encounter (Signed)
Medication mangement - Patient came in today for ordered TSH, Vitamin B12, and Vitamin D lab work. Patient had not drunk much prior to coming in and was not successful in getting labs after 3 attemps, both arms and left hand. Patient agreed to return in one week to attempt lab draw again and apologized this was not able to be completed today.  Patient stated several times she often has problems doing lab work due to small veins that she stated roll easily too.  Patient agreed to return in one week and to make sure to drink some water for volume that morning.

## 2023-09-26 ENCOUNTER — Other Ambulatory Visit (HOSPITAL_COMMUNITY): Payer: MEDICAID

## 2023-10-17 NOTE — Telephone Encounter (Signed)
Rx sent 

## 2023-10-19 NOTE — L&D Delivery Note (Signed)
 OB/GYN Faculty Practice Delivery Note  Tiffany Wong is a 33 y.o. [id number] s/p NSVD at [redacted]w[redacted]d. She was admitted for IOL for GDMA1.   ROM: 9h 62m with clear fluid GBS Status:  Negative/-- (10/27 0159) Maximum Maternal Temperature: 98.2 F    Labor Progress: Patient arrived at 2 cm dilation and was induced with misoprostol , pitocin , and AROM. She was 4 cm on last check around noon and had been 3-4 cm since around 0400. I was called to room urgently after baby delivered precipitously in the bed at 1409.   Delivery Date/Time: 09/05/2024 at 1409 Delivery: Called to room and baby was laying in the warmer, umbilical cord had already been cut. Patient was lying comfortably in bed. Cord blood drawn. Placenta delivered spontaneously with gentle cord traction. Fundus firm with massage and Pitocin . Labia, perineum, vagina, and cervix inspected with no lacerations.   Placenta: 3v intact, to L&D Complications: precip delivery in bed Lacerations: none EBL: 50 cc Analgesia: epidural   Infant: Baby boy  APGAR (1 MIN):  9 APGAR (5 MINS):  9  Weight: 3210 g  Donnice CHRISTELLA Carolus, MD/MPH Attending Family Medicine Physician, [facility] for [facility], [facility]
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
## 2023-10-31 ENCOUNTER — Encounter (HOSPITAL_COMMUNITY): Payer: Self-pay | Admitting: Psychiatry

## 2023-10-31 ENCOUNTER — Telehealth (HOSPITAL_COMMUNITY): Payer: MEDICAID | Admitting: Psychiatry

## 2023-10-31 VITALS — Wt 147.0 lb

## 2023-10-31 DIAGNOSIS — F319 Bipolar disorder, unspecified: Secondary | ICD-10-CM

## 2023-10-31 DIAGNOSIS — Z5181 Encounter for therapeutic drug level monitoring: Secondary | ICD-10-CM

## 2023-10-31 DIAGNOSIS — Z79899 Other long term (current) drug therapy: Secondary | ICD-10-CM

## 2023-10-31 MED ORDER — OLANZAPINE 5 MG PO TABS
5.0000 mg | ORAL_TABLET | Freq: Three times a day (TID) | ORAL | 2 refills | Status: DC
Start: 2023-10-31 — End: 2023-12-22

## 2023-10-31 MED ORDER — BUSPIRONE HCL 10 MG PO TABS: 10.0000 mg | ORAL_TABLET | Freq: Three times a day (TID) | ORAL | 2 refills | Status: DC

## 2023-10-31 MED ORDER — LAMOTRIGINE 150 MG PO TABS
150.0000 mg | ORAL_TABLET | Freq: Every day | ORAL | 2 refills | Status: DC
Start: 2023-10-31 — End: 2024-01-16

## 2023-10-31 NOTE — Patient Instructions (Signed)

## 2023-12-15 ENCOUNTER — Telehealth (HOSPITAL_COMMUNITY): Payer: Self-pay | Admitting: *Deleted

## 2023-12-15 NOTE — Telephone Encounter (Signed)
 Submitted via covermymeds for a PA for his olanzapine. Waiting on response.

## 2023-12-19 ENCOUNTER — Telehealth (HOSPITAL_COMMUNITY): Payer: Self-pay

## 2023-12-19 NOTE — Telephone Encounter (Signed)
 Patient called pertaining to a PA for her OLANZapine (ZYPREXA) 5 MG tablet pt stated she needed a PA done so she can pick up her prescription. The PA was done on last Thursday and was denied  due to provider prescribed more than FDA allows.

## 2023-12-21 ENCOUNTER — Telehealth (HOSPITAL_COMMUNITY): Payer: Self-pay | Admitting: *Deleted

## 2023-12-21 NOTE — Telephone Encounter (Signed)
 Patient called asking what is being done about her medication. She is unable to get her Zyprexa filled as it was denied. Asking that her MD call her to discuss alternative or figure out how she can get her medication.

## 2023-12-21 NOTE — Telephone Encounter (Signed)
 Fax received for PA of Olanzapine 5mg . Submitted online with cover my meds. Awaiting decision.

## 2023-12-22 ENCOUNTER — Telehealth (HOSPITAL_COMMUNITY): Payer: Self-pay | Admitting: *Deleted

## 2023-12-22 DIAGNOSIS — F319 Bipolar disorder, unspecified: Secondary | ICD-10-CM

## 2023-12-22 MED ORDER — OLANZAPINE 10 MG PO TABS
5.0000 mg | ORAL_TABLET | Freq: Three times a day (TID) | ORAL | 2 refills | Status: DC
Start: 2023-12-22 — End: 2024-01-16

## 2023-12-22 NOTE — Telephone Encounter (Signed)
 Fax received for PA approval of Olanzapine 5mg  until 12/21/23. Called to notify pharmacy.

## 2023-12-22 NOTE — Telephone Encounter (Signed)
 Faxed paperwork for PA of Olanzapine 10mg  after speaking with Shonette F. with Medicaid. Chart notes also faxed to [phone]. Awaiting decision on new prescription authorization.

## 2023-12-22 NOTE — Telephone Encounter (Signed)
 Fax received for Olanzapine was not approved and had been previously denied on 12/15/23. Pharmacy asking that the MD change the quantity since it exceeds the max dosage the FDA allows.

## 2023-12-26 ENCOUNTER — Telehealth (HOSPITAL_COMMUNITY): Payer: Self-pay

## 2023-12-26 NOTE — Telephone Encounter (Signed)
 Medication management - Patient called this nurse back to find out about her status with Olanzapine order. Informed this had been approved and patient's pharmacy was filling the medication. Patient agreed to call this nurse back if any other issues filling the medication.

## 2023-12-26 NOTE — Telephone Encounter (Signed)
 Medication management - Attempted to contact patient by pone after her request to speak to clinical manager but no answer and patient did not have a voicemail that had been set up yet to leave a message. Pt's Olanzapine was approved now and pharmacy aware.

## 2024-01-09 ENCOUNTER — Ambulatory Visit (HOSPITAL_COMMUNITY)
Admission: EM | Admit: 2024-01-09 | Discharge: 2024-01-09 | Disposition: A | Discharge status: Home / Self Care | Payer: MEDICAID

## 2024-01-09 ENCOUNTER — Encounter (HOSPITAL_COMMUNITY): Payer: Self-pay

## 2024-01-09 DIAGNOSIS — O219 Vomiting of pregnancy, unspecified: Secondary | ICD-10-CM | POA: Diagnosis not present

## 2024-01-09 DIAGNOSIS — Z3A00 Weeks of gestation of pregnancy not specified: Secondary | ICD-10-CM

## 2024-01-09 DIAGNOSIS — Z3A01 Less than 8 weeks gestation of pregnancy: Secondary | ICD-10-CM

## 2024-01-09 DIAGNOSIS — Z3201 Encounter for pregnancy test, result positive: Secondary | ICD-10-CM

## 2024-01-09 LAB — POCT URINALYSIS DIP (MANUAL ENTRY)
Bilirubin, UA: NEGATIVE
Blood, UA: NEGATIVE
Glucose, UA: NEGATIVE mg/dL
Leukocytes, UA: NEGATIVE
Nitrite, UA: NEGATIVE
Protein Ur, POC: NEGATIVE mg/dL
Spec Grav, UA: 1.02 (ref 1.010–1.025)
Urobilinogen, UA: 0.2 U/dL
pH, UA: 7 (ref 5.0–8.0)

## 2024-01-09 LAB — POCT URINE PREGNANCY: Preg Test, Ur: POSITIVE — AB

## 2024-01-09 NOTE — ED Triage Notes (Signed)
 Patient here today with c/o lower abd pain X 1 week and nausea X 3-4 days. Heartburn 2 days ago and vomited some. She tried taking Pepto Bismol with no relief.
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
## 2024-01-09 NOTE — Discharge Instructions (Addendum)
 1. Pregnancy related nausea and vomiting, antepartum (Primary) - POCT urinalysis dipstick shows trace ketones, no blood, no nitrite, no leukocytes, no sign of urinary tract infection. - POCT urine pregnancy performed in UC today is positive for pregnancy - Ambulatory referral to Obstetrics / Gynecology for follow-up evaluation and continued care during pregnancy. -Continue to monitor symptoms for any change in severity if there is any escalation of current symptoms or development of new symptoms such as vaginal bleeding, severe abdominal pain or cramping, vaginal discharge, pain with urination, increased urinary frequency, intractable nausea or vomiting, follow-up in the ER immediately for further evaluation and management.
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
## 2024-01-16 ENCOUNTER — Telehealth (INDEPENDENT_AMBULATORY_CARE_PROVIDER_SITE_OTHER): Payer: MEDICAID | Admitting: Psychiatry

## 2024-01-16 ENCOUNTER — Encounter (HOSPITAL_COMMUNITY): Payer: Self-pay | Admitting: Psychiatry

## 2024-01-16 DIAGNOSIS — R5383 Other fatigue: Secondary | ICD-10-CM | POA: Diagnosis not present

## 2024-01-16 DIAGNOSIS — F319 Bipolar disorder, unspecified: Secondary | ICD-10-CM | POA: Diagnosis not present

## 2024-01-16 DIAGNOSIS — O99341 Other mental disorders complicating pregnancy, first trimester: Secondary | ICD-10-CM | POA: Diagnosis not present

## 2024-01-16 DIAGNOSIS — Z3A01 Less than 8 weeks gestation of pregnancy: Secondary | ICD-10-CM

## 2024-01-16 DIAGNOSIS — Z79899 Other long term (current) drug therapy: Secondary | ICD-10-CM | POA: Diagnosis not present

## 2024-01-16 MED ORDER — LAMOTRIGINE 200 MG PO TABS
200.0000 mg | ORAL_TABLET | Freq: Every day | ORAL | 2 refills | Status: DC
Start: 2024-01-16 — End: 2024-03-09

## 2024-01-16 MED ORDER — OLANZAPINE 10 MG PO TABS
5.0000 mg | ORAL_TABLET | Freq: Three times a day (TID) | ORAL | 2 refills | Status: DC
Start: 2024-01-16 — End: 2024-03-09

## 2024-01-16 MED ORDER — BUSPIRONE HCL 10 MG PO TABS: 10.0000 mg | ORAL_TABLET | Freq: Three times a day (TID) | ORAL | 2 refills | Status: DC

## 2024-01-16 NOTE — Patient Instructions (Addendum)
 Thank you for attending your appointment today.  -- INCREASE lamotrigine to 200 mg daily  -- After about 1 week at this dose, call the clinic to schedule a lab appointment. We will get a few different labs including thyroid check, Vitamin D, Vitamin B12, and lamotrigine level. Please schedule the lab as late in the day as possible so we can get the most accurate level. -- Here is a Chief Technology Officer on safety of various medications in pregnancy:       [email or web address] -- Continue other medications as prescribed.  Please do not make any changes to medications without first discussing with your provider. If you are experiencing a psychiatric emergency, please call 911 or present to your nearest emergency department. Additional crisis, medication management, and therapy resources are included below.  [facility]  [address] [place], [place] [address] [phone] WALK-IN URGENT CARE 24/7 FOR ANYONE [address] [place], [place]  [phone] Fax: [phone] [email or web address] *Interpreters available *Accepts all insurance and uninsured for Urgent Care needs *Accepts Medicaid and uninsured for outpatient treatment (below)      ONLY FOR [facility]  Below:    Outpatient New Patient Assessment/Therapy Walk-ins:        Monday, Wednesday, and Thursday 8am until slots are full (first come, first served)                   New Patient Psychiatry/Medication Management        Monday-Friday 8am-11am (first come, first served)               For all walk-ins we ask that you arrive by 7:15am, because patients will be seen in the order of arrival.

## 2024-01-20 ENCOUNTER — Encounter: Payer: Self-pay | Admitting: *Deleted

## 2024-01-20 ENCOUNTER — Other Ambulatory Visit: Payer: Self-pay | Admitting: *Deleted

## 2024-01-24 ENCOUNTER — Other Ambulatory Visit (HOSPITAL_COMMUNITY): Payer: Self-pay | Admitting: Psychiatry

## 2024-01-24 DIAGNOSIS — F319 Bipolar disorder, unspecified: Secondary | ICD-10-CM

## 2024-01-27 ENCOUNTER — Other Ambulatory Visit (INDEPENDENT_AMBULATORY_CARE_PROVIDER_SITE_OTHER): Payer: MEDICAID

## 2024-01-27 ENCOUNTER — Ambulatory Visit: Payer: MEDICAID | Admitting: Obstetrics & Gynecology

## 2024-01-27 VITALS — BP 106/69 | HR 86 | Wt 150.9 lb

## 2024-01-27 DIAGNOSIS — O0991 Supervision of high risk pregnancy, unspecified, first trimester: Secondary | ICD-10-CM | POA: Diagnosis not present

## 2024-01-27 DIAGNOSIS — Z3493 Encounter for supervision of normal pregnancy, unspecified, third trimester: Secondary | ICD-10-CM | POA: Insufficient documentation

## 2024-01-27 DIAGNOSIS — O3680X0 Pregnancy with inconclusive fetal viability, not applicable or unspecified: Secondary | ICD-10-CM

## 2024-01-27 DIAGNOSIS — Z3A01 Less than 8 weeks gestation of pregnancy: Secondary | ICD-10-CM

## 2024-01-27 DIAGNOSIS — Z1339 Encounter for screening examination for other mental health and behavioral disorders: Secondary | ICD-10-CM | POA: Diagnosis not present

## 2024-01-27 DIAGNOSIS — O0990 Supervision of high risk pregnancy, unspecified, unspecified trimester: Secondary | ICD-10-CM | POA: Insufficient documentation

## 2024-01-27 MED ORDER — BLOOD PRESSURE KIT DEVI
1.0000 | 0 refills | Status: DC
Start: 2024-01-27 — End: 2024-09-07

## 2024-01-27 NOTE — Patient Instructions (Signed)
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
## 2024-01-29 LAB — URINE CULTURE, OB REFLEX

## 2024-01-29 LAB — CULTURE, OB URINE

## 2024-01-30 ENCOUNTER — Encounter: Payer: Self-pay | Admitting: Obstetrics and Gynecology

## 2024-02-07 ENCOUNTER — Encounter: Payer: Self-pay | Admitting: Obstetrics and Gynecology

## 2024-02-13 ENCOUNTER — Other Ambulatory Visit (INDEPENDENT_AMBULATORY_CARE_PROVIDER_SITE_OTHER): Payer: MEDICAID

## 2024-02-13 DIAGNOSIS — Z79899 Other long term (current) drug therapy: Secondary | ICD-10-CM

## 2024-02-13 DIAGNOSIS — R5383 Other fatigue: Secondary | ICD-10-CM | POA: Diagnosis not present

## 2024-02-13 NOTE — Progress Notes (Signed)
 Patient tolerated labs well , pt will follow up with provider.

## 2024-02-20 LAB — VITAMIN D 25 HYDROXY (VIT D DEFICIENCY, FRACTURES): Vit D, 25-Hydroxy: 27.4 ng/mL — ABNORMAL LOW (ref 30.0–100.0)

## 2024-02-20 LAB — HEPATIC FUNCTION PANEL
ALT: 8 IU/L (ref 0–32)
AST: 19 IU/L (ref 0–40)
Albumin: 4.2 g/dL (ref 3.9–4.9)
Alkaline Phosphatase: 54 IU/L (ref 44–121)
Bilirubin Total: <0.2 mg/dL (ref 0.0–1.2)
Bilirubin, Direct: <0.08 mg/dL (ref 0.00–0.40)
Total Protein: 7.7 g/dL (ref 6.0–8.5)

## 2024-02-20 LAB — LIPID PANEL
Chol/HDL Ratio: 2.7 ratio (ref 0.0–4.4)
Cholesterol, Total: 175 mg/dL (ref 100–199)
HDL: 65 mg/dL (ref >39)
LDL Chol Calc (NIH): 92 mg/dL (ref 0–99)
Triglycerides: 101 mg/dL (ref 0–149)
VLDL Cholesterol Cal: 18 mg/dL (ref 5–40)

## 2024-02-20 LAB — LAMOTRIGINE LEVEL

## 2024-02-20 LAB — THYROID PANEL WITH TSH
Free Thyroxine Index: 1.4 (ref 1.2–4.9)
T3 Uptake Ratio: 16 % — ABNORMAL LOW (ref 24–39)
T4, Total: 8.6 ug/dL (ref 4.5–12.0)
TSH: 0.639 u[IU]/mL (ref 0.450–4.500)

## 2024-02-20 LAB — HEMOGLOBIN A1C
Est. average glucose Bld gHb Est-mCnc: 108 mg/dL
Hgb A1c MFr Bld: 5.4 % (ref 4.8–5.6)

## 2024-02-20 LAB — VITAMIN B12: Vitamin B-12: 776 pg/mL (ref 232–1245)

## 2024-02-21 ENCOUNTER — Encounter: Payer: MEDICAID | Admitting: Obstetrics & Gynecology

## 2024-02-21 ENCOUNTER — Ambulatory Visit (INDEPENDENT_AMBULATORY_CARE_PROVIDER_SITE_OTHER): Payer: MEDICAID | Admitting: Obstetrics & Gynecology

## 2024-02-21 ENCOUNTER — Other Ambulatory Visit (HOSPITAL_COMMUNITY)
Admission: RE | Admit: 2024-02-21 | Discharge: 2024-02-21 | Disposition: A | Discharge status: Home / Self Care | Payer: MEDICAID | Source: Ambulatory Visit | Attending: Obstetrics & Gynecology | Admitting: Obstetrics & Gynecology

## 2024-02-21 VITALS — BP 111/71 | HR 89 | Wt 152.7 lb

## 2024-02-21 DIAGNOSIS — Z1339 Encounter for screening examination for other mental health and behavioral disorders: Secondary | ICD-10-CM | POA: Diagnosis not present

## 2024-02-21 DIAGNOSIS — F603 Borderline personality disorder: Secondary | ICD-10-CM

## 2024-02-21 DIAGNOSIS — Z3401 Encounter for supervision of normal first pregnancy, first trimester: Secondary | ICD-10-CM

## 2024-02-21 DIAGNOSIS — F319 Bipolar disorder, unspecified: Secondary | ICD-10-CM

## 2024-02-21 DIAGNOSIS — Z1151 Encounter for screening for human papillomavirus (HPV): Secondary | ICD-10-CM | POA: Insufficient documentation

## 2024-02-21 DIAGNOSIS — Z113 Encounter for screening for infections with a predominantly sexual mode of transmission: Secondary | ICD-10-CM | POA: Insufficient documentation

## 2024-02-21 DIAGNOSIS — O0990 Supervision of high risk pregnancy, unspecified, unspecified trimester: Secondary | ICD-10-CM

## 2024-02-21 DIAGNOSIS — O99341 Other mental disorders complicating pregnancy, first trimester: Secondary | ICD-10-CM | POA: Diagnosis not present

## 2024-02-21 DIAGNOSIS — Z3A11 11 weeks gestation of pregnancy: Secondary | ICD-10-CM

## 2024-02-21 DIAGNOSIS — Z124 Encounter for screening for malignant neoplasm of cervix: Secondary | ICD-10-CM | POA: Insufficient documentation

## 2024-02-21 NOTE — Progress Notes (Signed)
 Pt presents for new OB. No questions or concerns.  PHQ9 score 9; GAD7 score 13.
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
## 2024-02-22 LAB — CBC/D/PLT+RPR+RH+ABO+RUBIGG...
Antibody Screen: NEGATIVE
Basophils Absolute: 0 10*3/uL (ref 0.0–0.2)
Basos: 0 %
EOS (ABSOLUTE): 0.1 10*3/uL (ref 0.0–0.4)
Eos: 1 %
HCV Ab: NONREACTIVE
HIV Screen 4th Generation wRfx: NONREACTIVE
Hematocrit: 36.9 % (ref 34.0–46.6)
Hemoglobin: 12.4 g/dL (ref 11.1–15.9)
Hepatitis B Surface Ag: NEGATIVE
Immature Grans (Abs): 0.1 10*3/uL (ref 0.0–0.1)
Immature Granulocytes: 1 %
Lymphocytes Absolute: 2.3 10*3/uL (ref 0.7–3.1)
Lymphs: 22 %
MCH: 29.9 pg (ref 26.6–33.0)
MCHC: 33.6 g/dL (ref 31.5–35.7)
MCV: 89 fL (ref 79–97)
Monocytes Absolute: 0.9 10*3/uL (ref 0.1–0.9)
Monocytes: 9 %
Neutrophils Absolute: 6.7 10*3/uL (ref 1.4–7.0)
Neutrophils: 67 %
Platelets: 264 10*3/uL (ref 150–450)
RBC: 4.15 x10E6/uL (ref 3.77–5.28)
RDW: 14.2 % (ref 11.7–15.4)
RPR Ser Ql: NONREACTIVE
Rh Factor: POSITIVE
Rubella Antibodies, IGG: 5.84 {index} (ref >0.99)
WBC: 10.1 10*3/uL (ref 3.4–10.8)

## 2024-02-22 LAB — CERVICOVAGINAL ANCILLARY ONLY
Bacterial Vaginitis (gardnerella): POSITIVE — AB
Candida Glabrata: NEGATIVE
Candida Vaginitis: NEGATIVE
Chlamydia: NEGATIVE
Comment: NEGATIVE
Comment: NEGATIVE
Comment: NEGATIVE
Comment: NEGATIVE
Comment: NEGATIVE
Comment: NORMAL
Neisseria Gonorrhea: NEGATIVE
Trichomonas: NEGATIVE

## 2024-02-22 LAB — HCV INTERPRETATION

## 2024-02-23 ENCOUNTER — Encounter: Payer: Self-pay | Admitting: Obstetrics & Gynecology

## 2024-02-23 ENCOUNTER — Other Ambulatory Visit: Payer: Self-pay

## 2024-02-23 MED ORDER — METRONIDAZOLE 500 MG PO TABS: 500.0000 mg | ORAL_TABLET | Freq: Two times a day (BID) | ORAL | 0 refills | Status: DC

## 2024-02-23 NOTE — Progress Notes (Signed)
 Flagyl  rx sent per protocol

## 2024-02-24 LAB — CYTOLOGY - PAP
Comment: NEGATIVE
Comment: NEGATIVE
Comment: NEGATIVE
Diagnosis: UNDETERMINED — AB
HPV 16: NEGATIVE
HPV 18 / 45: NEGATIVE
High risk HPV: POSITIVE — AB

## 2024-02-26 LAB — PANORAMA PRENATAL TEST FULL PANEL:PANORAMA TEST PLUS 5 ADDITIONAL MICRODELETIONS: FETAL FRACTION: 12.7

## 2024-02-29 ENCOUNTER — Ambulatory Visit (HOSPITAL_COMMUNITY): Payer: Self-pay | Admitting: Psychiatry

## 2024-02-29 ENCOUNTER — Telehealth: Payer: Self-pay | Admitting: Licensed Clinical Social Worker

## 2024-02-29 NOTE — Telephone Encounter (Signed)
 [facility] contacted patient to schedule [facility] appointment and left VM.

## 2024-03-01 ENCOUNTER — Ambulatory Visit: Payer: Self-pay | Admitting: Obstetrics & Gynecology

## 2024-03-01 NOTE — Progress Notes (Signed)
Patient needs colposcopy
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
## 2024-03-09 ENCOUNTER — Encounter (HOSPITAL_COMMUNITY): Payer: Self-pay | Admitting: Psychiatry

## 2024-03-09 ENCOUNTER — Telehealth (INDEPENDENT_AMBULATORY_CARE_PROVIDER_SITE_OTHER): Payer: MEDICAID | Admitting: Psychiatry

## 2024-03-09 DIAGNOSIS — F319 Bipolar disorder, unspecified: Secondary | ICD-10-CM | POA: Diagnosis not present

## 2024-03-09 DIAGNOSIS — Z79899 Other long term (current) drug therapy: Secondary | ICD-10-CM | POA: Diagnosis not present

## 2024-03-09 MED ORDER — LAMOTRIGINE 200 MG PO TABS: 200.0000 mg | ORAL_TABLET | Freq: Every day | ORAL | 2 refills | Status: DC

## 2024-03-09 MED ORDER — BUSPIRONE HCL 10 MG PO TABS: 10.0000 mg | ORAL_TABLET | Freq: Three times a day (TID) | ORAL | 2 refills | Status: AC

## 2024-03-09 MED ORDER — OLANZAPINE 10 MG PO TABS: 5.0000 mg | ORAL_TABLET | Freq: Three times a day (TID) | ORAL | 2 refills | Status: DC

## 2024-03-09 NOTE — Patient Instructions (Signed)
 Thank you for attending your appointment today.  -- We did not make any medication changes today. Please continue medications as prescribed. -- Please make an appointment with your primary care provider for further evaluation of your rash.  Please do not make any changes to medications without first discussing with your provider. If you are experiencing a psychiatric emergency, please call 911 or present to your nearest emergency department. Additional crisis, medication management, and therapy resources are included below.  [facility]  [address], [place], [place] [address] [phone] WALK-IN URGENT CARE 24/7 FOR ANYONE [address], [place], [place]  [phone] Fax: [phone] [email or web address] *Interpreters available *Accepts all insurance and uninsured for Urgent Care needs *Accepts Medicaid and uninsured for outpatient treatment (below)      ONLY FOR [facility]  Below:    Outpatient New Patient Assessment/Therapy Walk-ins:        Monday, Wednesday, and Thursday 8am until slots are full (first come, first served)                   New Patient Psychiatry/Medication Management        Monday-Friday 8am-11am (first come, first served)               For all walk-ins we ask that you arrive by 7:15am, because patients will be seen in the order of arrival.

## 2024-03-20 ENCOUNTER — Ambulatory Visit (INDEPENDENT_AMBULATORY_CARE_PROVIDER_SITE_OTHER): Payer: MEDICAID | Admitting: Advanced Practice Midwife

## 2024-03-20 ENCOUNTER — Encounter: Payer: Self-pay | Admitting: Advanced Practice Midwife

## 2024-03-20 VITALS — BP 107/63 | HR 98 | Wt 157.0 lb

## 2024-03-20 DIAGNOSIS — R87810 Cervical high risk human papillomavirus (HPV) DNA test positive: Secondary | ICD-10-CM

## 2024-03-20 DIAGNOSIS — Z3490 Encounter for supervision of normal pregnancy, unspecified, unspecified trimester: Secondary | ICD-10-CM | POA: Diagnosis not present

## 2024-03-20 DIAGNOSIS — R87610 Atypical squamous cells of undetermined significance on cytologic smear of cervix (ASC-US): Secondary | ICD-10-CM | POA: Diagnosis not present

## 2024-03-20 DIAGNOSIS — Z3A15 15 weeks gestation of pregnancy: Secondary | ICD-10-CM

## 2024-03-20 NOTE — Progress Notes (Signed)
 Pt presents for ROB visit. Pt c/o sharp abd pain.
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
## 2024-03-22 LAB — AFP, SERUM, OPEN SPINA BIFIDA
AFP MoM: 1.07
AFP Value: 33.1 ng/mL
Gest. Age on Collection Date: 15 wk
Maternal Age At EDD: 34.7 a
OSBR Risk 1 IN: 10000
Test Results:: NEGATIVE
Weight: 157 [lb_av]

## 2024-03-26 ENCOUNTER — Other Ambulatory Visit (INDEPENDENT_AMBULATORY_CARE_PROVIDER_SITE_OTHER): Payer: MEDICAID

## 2024-03-26 DIAGNOSIS — Z79899 Other long term (current) drug therapy: Secondary | ICD-10-CM | POA: Diagnosis not present

## 2024-03-26 DIAGNOSIS — O0990 Supervision of high risk pregnancy, unspecified, unspecified trimester: Secondary | ICD-10-CM

## 2024-03-26 DIAGNOSIS — F319 Bipolar disorder, unspecified: Secondary | ICD-10-CM | POA: Diagnosis not present

## 2024-03-26 NOTE — Progress Notes (Signed)
 Pt tolerated labs well in right arm, with no complaints.    JNL, CMA

## 2024-03-29 LAB — HEMOGLOBIN A1C
Est. average glucose Bld gHb Est-mCnc: 105 mg/dL
Hgb A1c MFr Bld: 5.3 % (ref 4.8–5.6)

## 2024-04-05 ENCOUNTER — Ambulatory Visit (HOSPITAL_COMMUNITY): Payer: Self-pay | Admitting: Psychiatry

## 2024-04-23 ENCOUNTER — Ambulatory Visit (INDEPENDENT_AMBULATORY_CARE_PROVIDER_SITE_OTHER): Payer: MEDICAID | Admitting: Obstetrics and Gynecology

## 2024-04-23 ENCOUNTER — Encounter: Payer: Self-pay | Admitting: Obstetrics and Gynecology

## 2024-04-23 VITALS — BP 106/72 | HR 97 | Wt 164.0 lb

## 2024-04-23 DIAGNOSIS — Z720 Tobacco use: Secondary | ICD-10-CM

## 2024-04-23 DIAGNOSIS — Z3A20 20 weeks gestation of pregnancy: Secondary | ICD-10-CM

## 2024-04-23 DIAGNOSIS — R87610 Atypical squamous cells of undetermined significance on cytologic smear of cervix (ASC-US): Secondary | ICD-10-CM

## 2024-04-23 DIAGNOSIS — F1290 Cannabis use, unspecified, uncomplicated: Secondary | ICD-10-CM

## 2024-04-23 DIAGNOSIS — O0992 Supervision of high risk pregnancy, unspecified, second trimester: Secondary | ICD-10-CM | POA: Diagnosis not present

## 2024-04-23 DIAGNOSIS — O0990 Supervision of high risk pregnancy, unspecified, unspecified trimester: Secondary | ICD-10-CM

## 2024-04-23 DIAGNOSIS — F3162 Bipolar disorder, current episode mixed, moderate: Secondary | ICD-10-CM | POA: Diagnosis not present

## 2024-04-23 DIAGNOSIS — A6000 Herpesviral infection of urogenital system, unspecified: Secondary | ICD-10-CM

## 2024-04-23 NOTE — Patient Instructions (Signed)
 There are 2 things you MUST do to have a waterbirth with [facility]: Attend a waterbirth class at [facility] at [facility]   3rd Wednesday of every month from 7-9 pm (virtual during COVID) [employer] at [email or web address] or [email or web address] or by calling [phone] Bring us  the certificate from the class to your prenatal appointment or send via MyChart Meet with a midwife at 36 weeks* to see if you can still plan a waterbirth and to sign the consent.
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
## 2024-04-23 NOTE — Progress Notes (Signed)
 Pt has u/s scheduled on 7/10.

## 2024-04-26 ENCOUNTER — Ambulatory Visit: Payer: MEDICAID | Attending: Obstetrics and Gynecology | Admitting: Obstetrics and Gynecology

## 2024-04-26 ENCOUNTER — Ambulatory Visit: Payer: MEDICAID

## 2024-04-26 ENCOUNTER — Other Ambulatory Visit: Payer: Self-pay | Admitting: *Deleted

## 2024-04-26 VITALS — BP 112/60 | HR 96

## 2024-04-26 DIAGNOSIS — Z363 Encounter for antenatal screening for malformations: Secondary | ICD-10-CM | POA: Diagnosis not present

## 2024-04-26 DIAGNOSIS — F1290 Cannabis use, unspecified, uncomplicated: Secondary | ICD-10-CM | POA: Diagnosis not present

## 2024-04-26 DIAGNOSIS — O99340 Other mental disorders complicating pregnancy, unspecified trimester: Secondary | ICD-10-CM | POA: Diagnosis not present

## 2024-04-26 DIAGNOSIS — Z3A20 20 weeks gestation of pregnancy: Secondary | ICD-10-CM

## 2024-04-26 DIAGNOSIS — F17200 Nicotine dependence, unspecified, uncomplicated: Secondary | ICD-10-CM | POA: Diagnosis not present

## 2024-04-26 DIAGNOSIS — O99323 Drug use complicating pregnancy, third trimester: Secondary | ICD-10-CM

## 2024-04-26 DIAGNOSIS — O4440 Low lying placenta NOS or without hemorrhage, unspecified trimester: Secondary | ICD-10-CM | POA: Diagnosis not present

## 2024-04-26 DIAGNOSIS — O0992 Supervision of high risk pregnancy, unspecified, second trimester: Secondary | ICD-10-CM | POA: Diagnosis present

## 2024-04-26 DIAGNOSIS — O99332 Smoking (tobacco) complicating pregnancy, second trimester: Secondary | ICD-10-CM

## 2024-04-26 DIAGNOSIS — O4442 Low lying placenta NOS or without hemorrhage, second trimester: Secondary | ICD-10-CM

## 2024-04-26 DIAGNOSIS — Z720 Tobacco use: Secondary | ICD-10-CM

## 2024-04-26 DIAGNOSIS — O0990 Supervision of high risk pregnancy, unspecified, unspecified trimester: Secondary | ICD-10-CM

## 2024-04-26 NOTE — Progress Notes (Signed)
 Maternal-Fetal Medicine Consultation Name: Tiffany Wong MRN: [id number]  G6 P3023 at 20w 5d gestation. Patient is here for fetal anatomy scan. On cell-free fetal DNA screening, the risks of aneuploidies are not increased. MSAFP screening showed low risk for open-neural tube defects.  Patient has Bipolar I disorder and was seen by her psychiatrist in May 2025.  She takes olanzapine , buspirone  and Lamictal .  Obstetric history significant for 3 term vaginal deliveries.  We performed fetal anatomy scan. No makers of aneuploidies or fetal structural defects are seen. Fetal biometry is consistent with her previously-established dates. Amniotic fluid is normal and good fetal activity is seen.  Low-lying placenta is seen.  No evidence of vasa previa.  Patient understands the limitations of ultrasound in detecting fetal anomalies.   I reassured the patient that low-lying placenta usually resolves with advancing gestation.  I counseled the patient on the medications. Olanzapine : It is a psychotropic agent and is prescribed to patients with bipolar disorder.  Animal studies have shown no increased risk of fetal congenital malformations.  Human data is limited but no increased congenital malformations were noted.  Lamictal  (lamotrigene): From the registry that reported 1,558 pregnancies exposed to lamotrigene, no increased congenital malformations (over the background rate) have been reported. Some reports showed increased  risk of cleft lip/palate with lamictal  exposure in the first trimester. Absolute risk is reported to be 0.1% and 0.4%.  I reassured the patient of normal facial anatomy and that there is no evidence of cleft lip or palate.  Buspirone  (Buspar ): It is an antianxiety drug. No increased congenital malformations are reported with this drug in pregnancy. Only limited data is available. It is not known if the drug crosses the placenta but because of its low molecular weight, it is expected to  cross the placenta. Effect on the newborn with breastfeeding while on this medication is not clearly known.  Tobacco use in pregnancy Patient reports she smokes about 4 to 5 cigarettes daily. Cigarette smoking increases the risk of maternal complications that include placental abruption and placenta previa. Fetal complications include fetal growth restriction, prematurity, stillbirth and sudden infant death syndrome (SIDS). Risk of spontaneous abortions is increased.   Smoking cessation at any gestational age is beneficial and should routinely be addressed in her prenatal visits. Nicotine -replacement therapy (NRT) can be considered if nonpharmacological approaches have failed. Its chief benefit is that it does not contain other toxic substances present in cigarettes. Cigarettes, in addition to nicotine  and Carbon monoxide, contain several other toxic substances.  Patient will discuss with her provider on NRT.  Patient reports she does not take marijuana.   Recommendations - Appointment was made for her to return in 8 weeks for fetal growth assessment. - Serial fetal growth assessments till delivery.  Consultation including face-to-face (more than 50%) counseling 30 minutes.

## 2024-05-01 NOTE — Progress Notes (Unsigned)
 Patient did not connect for virtual psychiatric medication management appointment on 05/03/24 at 11:30AM. Sent secure video link with no response. Called phone with no answer; left VM with callback number to reschedule.  LAURAINE DELENA PUMMEL, MD 05/03/24

## 2024-05-03 ENCOUNTER — Encounter (HOSPITAL_COMMUNITY): Payer: Self-pay

## 2024-05-03 ENCOUNTER — Encounter (HOSPITAL_COMMUNITY): Payer: MEDICAID | Admitting: Psychiatry

## 2024-05-29 ENCOUNTER — Encounter: Payer: Self-pay | Admitting: Advanced Practice Midwife

## 2024-05-29 ENCOUNTER — Ambulatory Visit: Payer: MEDICAID | Admitting: Advanced Practice Midwife

## 2024-05-29 VITALS — BP 110/68 | HR 111 | Wt 169.0 lb

## 2024-05-29 DIAGNOSIS — F1290 Cannabis use, unspecified, uncomplicated: Secondary | ICD-10-CM | POA: Diagnosis not present

## 2024-05-29 DIAGNOSIS — O98312 Other infections with a predominantly sexual mode of transmission complicating pregnancy, second trimester: Secondary | ICD-10-CM

## 2024-05-29 DIAGNOSIS — O0990 Supervision of high risk pregnancy, unspecified, unspecified trimester: Secondary | ICD-10-CM

## 2024-05-29 DIAGNOSIS — F3162 Bipolar disorder, current episode mixed, moderate: Secondary | ICD-10-CM

## 2024-05-29 DIAGNOSIS — Z720 Tobacco use: Secondary | ICD-10-CM | POA: Diagnosis not present

## 2024-05-29 DIAGNOSIS — O99342 Other mental disorders complicating pregnancy, second trimester: Secondary | ICD-10-CM | POA: Diagnosis not present

## 2024-05-29 DIAGNOSIS — Z3A25 25 weeks gestation of pregnancy: Secondary | ICD-10-CM

## 2024-05-29 DIAGNOSIS — A6000 Herpesviral infection of urogenital system, unspecified: Secondary | ICD-10-CM

## 2024-05-29 MED ORDER — PRENATAL VITAMIN 27-0.8 MG PO TABS: 1.0000 | ORAL_TABLET | Freq: Every day | ORAL | 3 refills | Status: AC
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
## 2024-05-29 NOTE — Progress Notes (Signed)
 Pt presents for ROB visit. Requesting Rx for PNV. Has questions about water  birth class

## 2024-06-13 ENCOUNTER — Other Ambulatory Visit (HOSPITAL_COMMUNITY): Payer: Self-pay | Admitting: Psychiatry

## 2024-06-13 DIAGNOSIS — F319 Bipolar disorder, unspecified: Secondary | ICD-10-CM

## 2024-06-21 ENCOUNTER — Other Ambulatory Visit: Payer: Self-pay | Admitting: *Deleted

## 2024-06-21 ENCOUNTER — Ambulatory Visit: Payer: MEDICAID

## 2024-06-21 ENCOUNTER — Ambulatory Visit: Payer: MEDICAID | Attending: Obstetrics and Gynecology | Admitting: Maternal & Fetal Medicine

## 2024-06-21 DIAGNOSIS — Z3A28 28 weeks gestation of pregnancy: Secondary | ICD-10-CM | POA: Diagnosis not present

## 2024-06-21 DIAGNOSIS — O4443 Low lying placenta NOS or without hemorrhage, third trimester: Secondary | ICD-10-CM | POA: Diagnosis not present

## 2024-06-21 DIAGNOSIS — O99343 Other mental disorders complicating pregnancy, third trimester: Secondary | ICD-10-CM | POA: Insufficient documentation

## 2024-06-21 DIAGNOSIS — Z720 Tobacco use: Secondary | ICD-10-CM

## 2024-06-21 DIAGNOSIS — F17210 Nicotine dependence, cigarettes, uncomplicated: Secondary | ICD-10-CM

## 2024-06-21 DIAGNOSIS — O99333 Smoking (tobacco) complicating pregnancy, third trimester: Secondary | ICD-10-CM

## 2024-06-21 DIAGNOSIS — O99332 Smoking (tobacco) complicating pregnancy, second trimester: Secondary | ICD-10-CM | POA: Insufficient documentation

## 2024-06-21 DIAGNOSIS — O4440 Low lying placenta NOS or without hemorrhage, unspecified trimester: Secondary | ICD-10-CM
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
## 2024-06-26 ENCOUNTER — Encounter: Payer: Self-pay | Admitting: Certified Nurse Midwife

## 2024-06-26 ENCOUNTER — Other Ambulatory Visit: Payer: MEDICAID

## 2024-06-26 ENCOUNTER — Ambulatory Visit (INDEPENDENT_AMBULATORY_CARE_PROVIDER_SITE_OTHER): Payer: MEDICAID | Admitting: Certified Nurse Midwife

## 2024-06-26 VITALS — BP 109/68 | HR 100 | Wt 175.0 lb

## 2024-06-26 DIAGNOSIS — B009 Herpesviral infection, unspecified: Secondary | ICD-10-CM

## 2024-06-26 DIAGNOSIS — Z23 Encounter for immunization: Secondary | ICD-10-CM | POA: Diagnosis not present

## 2024-06-26 DIAGNOSIS — Z3A29 29 weeks gestation of pregnancy: Secondary | ICD-10-CM | POA: Diagnosis not present

## 2024-06-26 DIAGNOSIS — Z720 Tobacco use: Secondary | ICD-10-CM

## 2024-06-26 DIAGNOSIS — F3162 Bipolar disorder, current episode mixed, moderate: Secondary | ICD-10-CM | POA: Diagnosis not present

## 2024-06-26 DIAGNOSIS — K219 Gastro-esophageal reflux disease without esophagitis: Secondary | ICD-10-CM

## 2024-06-26 DIAGNOSIS — Z3493 Encounter for supervision of normal pregnancy, unspecified, third trimester: Secondary | ICD-10-CM

## 2024-06-26 DIAGNOSIS — O0990 Supervision of high risk pregnancy, unspecified, unspecified trimester: Secondary | ICD-10-CM

## 2024-06-26 MED ORDER — PANTOPRAZOLE SODIUM 20 MG PO TBEC: 20.0000 mg | DELAYED_RELEASE_TABLET | Freq: Every day | ORAL | 3 refills | Status: DC
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
## 2024-06-26 NOTE — Progress Notes (Signed)
 Acid reflux strange with odor. Getting worse. Round ligament pains worse as well.

## 2024-06-27 LAB — CBC
Hematocrit: 35 % (ref 34.0–46.6)
Hemoglobin: 11.6 g/dL (ref 11.1–15.9)
MCH: 29.4 pg (ref 26.6–33.0)
MCHC: 33.1 g/dL (ref 31.5–35.7)
MCV: 89 fL (ref 79–97)
Platelets: 256 x10E3/uL (ref 150–450)
RBC: 3.94 x10E6/uL (ref 3.77–5.28)
RDW: 13.9 % (ref 11.7–15.4)
WBC: 13.6 x10E3/uL — ABNORMAL HIGH (ref 3.4–10.8)

## 2024-06-27 LAB — GLUCOSE TOLERANCE, 2 HOURS W/ 1HR
Glucose, 1 hour: 168 mg/dL (ref 70–179)
Glucose, 2 hour: 122 mg/dL (ref 70–152)
Glucose, Fasting: 98 mg/dL — ABNORMAL HIGH (ref 70–91)

## 2024-06-27 LAB — RPR: RPR Ser Ql: NONREACTIVE

## 2024-06-27 LAB — HIV ANTIBODY (ROUTINE TESTING W REFLEX): HIV Screen 4th Generation wRfx: NONREACTIVE

## 2024-06-28 ENCOUNTER — Ambulatory Visit: Payer: Self-pay | Admitting: Certified Nurse Midwife

## 2024-06-28 DIAGNOSIS — O24419 Gestational diabetes mellitus in pregnancy, unspecified control: Secondary | ICD-10-CM
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
## 2024-07-04 ENCOUNTER — Encounter (HOSPITAL_COMMUNITY): Payer: Self-pay | Admitting: Psychiatry

## 2024-07-04 ENCOUNTER — Telehealth (HOSPITAL_COMMUNITY): Payer: MEDICAID | Admitting: Psychiatry

## 2024-07-04 DIAGNOSIS — Z720 Tobacco use: Secondary | ICD-10-CM

## 2024-07-04 DIAGNOSIS — Z3A00 Weeks of gestation of pregnancy not specified: Secondary | ICD-10-CM

## 2024-07-04 DIAGNOSIS — F1290 Cannabis use, unspecified, uncomplicated: Secondary | ICD-10-CM

## 2024-07-04 DIAGNOSIS — F4310 Post-traumatic stress disorder, unspecified: Secondary | ICD-10-CM

## 2024-07-04 DIAGNOSIS — Z8659 Personal history of other mental and behavioral disorders: Secondary | ICD-10-CM

## 2024-07-04 DIAGNOSIS — F319 Bipolar disorder, unspecified: Secondary | ICD-10-CM | POA: Diagnosis not present

## 2024-07-04 DIAGNOSIS — O99343 Other mental disorders complicating pregnancy, third trimester: Secondary | ICD-10-CM

## 2024-07-04 DIAGNOSIS — O99323 Drug use complicating pregnancy, third trimester: Secondary | ICD-10-CM

## 2024-07-04 DIAGNOSIS — F411 Generalized anxiety disorder: Secondary | ICD-10-CM | POA: Diagnosis not present

## 2024-07-04 MED ORDER — NICOTINE 7 MG/24HR TD PT24: 7.0000 mg | MEDICATED_PATCH | Freq: Every day | TRANSDERMAL | 2 refills | Status: DC

## 2024-07-04 MED ORDER — OLANZAPINE 10 MG PO TABS: ORAL_TABLET | ORAL | 2 refills | Status: DC

## 2024-07-04 MED ORDER — LAMOTRIGINE 200 MG PO TABS: 200.0000 mg | ORAL_TABLET | Freq: Every day | ORAL | 2 refills | Status: DC

## 2024-07-04 MED ORDER — BUSPIRONE HCL 15 MG PO TABS: 15.0000 mg | ORAL_TABLET | Freq: Two times a day (BID) | ORAL | 2 refills | Status: DC

## 2024-07-04 NOTE — Patient Instructions (Signed)
 Thank you for attending your appointment today.  -- CHANGE olanzapine  dosing to 5 mg in the morning + 10 mg nightly -- CHANGE buspirone  dosing to 15 mg twice daily -- Continue other medications as prescribed.  Please do not make any changes to medications without first discussing with your provider. If you are experiencing a psychiatric emergency, please call 911 or present to your nearest emergency department. Additional crisis, medication management, and therapy resources are included below.  [facility]  [address] [place], [place] [address] [phone] WALK-IN URGENT CARE 24/7 FOR ANYONE [address] [place], [place]  [phone] Fax: [phone] [email or web address] *Interpreters available *Accepts all insurance and uninsured for Urgent Care needs *Accepts Medicaid and uninsured for outpatient treatment (below)      ONLY FOR [facility]  Below:    Outpatient New Patient Assessment/Therapy Walk-ins:        Monday, Wednesday, and Thursday 8am until slots are full (first come, first served)                   New Patient Psychiatry/Medication Management        Monday-Friday 8am-11am (first come, first served)               For all walk-ins we ask that you arrive by 7:15am, because patients will be seen in the order of arrival.

## 2024-07-10 ENCOUNTER — Ambulatory Visit: Payer: MEDICAID | Admitting: Advanced Practice Midwife

## 2024-07-10 ENCOUNTER — Encounter: Payer: Self-pay | Admitting: Advanced Practice Midwife

## 2024-07-10 VITALS — BP 116/73 | HR 125 | Wt 175.8 lb

## 2024-07-10 DIAGNOSIS — Z3A31 31 weeks gestation of pregnancy: Secondary | ICD-10-CM | POA: Diagnosis not present

## 2024-07-10 DIAGNOSIS — O24419 Gestational diabetes mellitus in pregnancy, unspecified control: Secondary | ICD-10-CM | POA: Diagnosis not present

## 2024-07-10 DIAGNOSIS — R102 Pelvic and perineal pain: Secondary | ICD-10-CM

## 2024-07-10 DIAGNOSIS — O26893 Other specified pregnancy related conditions, third trimester: Secondary | ICD-10-CM | POA: Diagnosis not present

## 2024-07-10 DIAGNOSIS — Z3493 Encounter for supervision of normal pregnancy, unspecified, third trimester: Secondary | ICD-10-CM

## 2024-07-10 MED ORDER — ACCU-CHEK GUIDE W/DEVICE KIT
1.0000 | PACK | Freq: Four times a day (QID) | 0 refills | Status: DC
Start: 2024-07-10 — End: 2024-09-07

## 2024-07-10 MED ORDER — ACCU-CHEK SOFTCLIX LANCETS MISC: 12 refills | Status: DC

## 2024-07-10 MED ORDER — ACCU-CHEK GUIDE TEST VI STRP: ORAL_STRIP | 12 refills | Status: DC

## 2024-07-10 MED ORDER — DEXCOM G7 SENSOR MISC: 1.0000 | Freq: Every day | 2 refills | Status: DC
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
## 2024-07-10 NOTE — Progress Notes (Signed)
 GDM. Would like to discuss this diagnosis with the provider today. Has not been set up with the diabetes educator.   Would like to discuss dexcom vs lancets.
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
## 2024-07-12 ENCOUNTER — Ambulatory Visit: Payer: MEDICAID | Attending: Advanced Practice Midwife | Admitting: Physical Therapy

## 2024-07-12 ENCOUNTER — Encounter: Payer: Self-pay | Admitting: Physical Therapy

## 2024-07-12 ENCOUNTER — Other Ambulatory Visit: Payer: Self-pay

## 2024-07-12 DIAGNOSIS — M5459 Other low back pain: Secondary | ICD-10-CM | POA: Diagnosis present

## 2024-07-12 DIAGNOSIS — R102 Pelvic and perineal pain: Secondary | ICD-10-CM | POA: Insufficient documentation

## 2024-07-12 DIAGNOSIS — O26893 Other specified pregnancy related conditions, third trimester: Secondary | ICD-10-CM | POA: Insufficient documentation

## 2024-07-12 DIAGNOSIS — M6281 Muscle weakness (generalized): Secondary | ICD-10-CM | POA: Diagnosis present

## 2024-07-12 NOTE — Progress Notes (Signed)
 Patient was seen on 07/18/2024 for Gestational Diabetes self-management class at [facility]. Pt arrived alone with CGM sensor in place. The following learning objectives were met by the patient during this course:  States the definition of Gestational Diabetes States why dietary management is important in controlling blood glucose Describes the effects each nutrient has on blood glucose levels Demonstrates ability to create a balanced meal plan Demonstrates carbohydrate counting  States when to check blood glucose levels Demonstrates proper blood glucose monitoring techniques States the effect of stress and exercise on blood glucose levels States the importance of limiting caffeine and abstaining from alcohol and smoking   Patient has a meter and CGM prior to visit. Patient is instructed to begin testing pre breakfast and 2 hours after each meal. Blood glucose today in class 103 mg/dL, reported as pre meal per Pt  Patient instructed to monitor glucose levels:  QID FBS: 60 - <95 2 hour: <120  *Patient received handouts: Nutrition Diabetes and Pregnancy Carbohydrate Counting List Blood glucose log Snack ideas for diabetes during pregnancy Plate Planner  Patient will be seen for follow-up as needed.

## 2024-07-16 ENCOUNTER — Ambulatory Visit: Payer: MEDICAID | Admitting: Physical Therapy

## 2024-07-16 ENCOUNTER — Encounter: Payer: Self-pay | Admitting: Physical Therapy

## 2024-07-16 DIAGNOSIS — M5459 Other low back pain: Secondary | ICD-10-CM | POA: Diagnosis not present

## 2024-07-16 DIAGNOSIS — M6281 Muscle weakness (generalized): Secondary | ICD-10-CM

## 2024-07-16 DIAGNOSIS — R102 Pelvic and perineal pain: Secondary | ICD-10-CM
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
## 2024-07-18 ENCOUNTER — Encounter: Payer: MEDICAID | Attending: Advanced Practice Midwife | Admitting: Dietician

## 2024-07-18 DIAGNOSIS — O24419 Gestational diabetes mellitus in pregnancy, unspecified control: Secondary | ICD-10-CM | POA: Diagnosis present

## 2024-07-24 ENCOUNTER — Other Ambulatory Visit: Payer: Self-pay | Admitting: *Deleted

## 2024-07-24 ENCOUNTER — Ambulatory Visit: Payer: MEDICAID | Admitting: Maternal & Fetal Medicine

## 2024-07-24 ENCOUNTER — Ambulatory Visit: Payer: MEDICAID | Attending: Obstetrics and Gynecology

## 2024-07-24 VITALS — BP 103/62

## 2024-07-24 DIAGNOSIS — Z3689 Encounter for other specified antenatal screening: Secondary | ICD-10-CM | POA: Insufficient documentation

## 2024-07-24 DIAGNOSIS — O99333 Smoking (tobacco) complicating pregnancy, third trimester: Secondary | ICD-10-CM | POA: Insufficient documentation

## 2024-07-24 DIAGNOSIS — E109 Type 1 diabetes mellitus without complications: Secondary | ICD-10-CM

## 2024-07-24 DIAGNOSIS — O24410 Gestational diabetes mellitus in pregnancy, diet controlled: Secondary | ICD-10-CM | POA: Insufficient documentation

## 2024-07-24 DIAGNOSIS — Z363 Encounter for antenatal screening for malformations: Secondary | ICD-10-CM | POA: Insufficient documentation

## 2024-07-24 DIAGNOSIS — O4443 Low lying placenta NOS or without hemorrhage, third trimester: Secondary | ICD-10-CM | POA: Insufficient documentation

## 2024-07-24 DIAGNOSIS — F17210 Nicotine dependence, cigarettes, uncomplicated: Secondary | ICD-10-CM | POA: Diagnosis not present

## 2024-07-24 DIAGNOSIS — O24013 Pre-existing diabetes mellitus, type 1, in pregnancy, third trimester: Secondary | ICD-10-CM

## 2024-07-24 DIAGNOSIS — O358XX0 Maternal care for other (suspected) fetal abnormality and damage, not applicable or unspecified: Secondary | ICD-10-CM

## 2024-07-24 DIAGNOSIS — O99343 Other mental disorders complicating pregnancy, third trimester: Secondary | ICD-10-CM | POA: Diagnosis not present

## 2024-07-24 DIAGNOSIS — Z3493 Encounter for supervision of normal pregnancy, unspecified, third trimester: Secondary | ICD-10-CM

## 2024-07-24 DIAGNOSIS — Z3A33 33 weeks gestation of pregnancy: Secondary | ICD-10-CM | POA: Insufficient documentation

## 2024-07-24 DIAGNOSIS — O99332 Smoking (tobacco) complicating pregnancy, second trimester: Secondary | ICD-10-CM
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
## 2024-07-25 ENCOUNTER — Encounter: Payer: MEDICAID | Admitting: Obstetrics and Gynecology

## 2024-07-27 ENCOUNTER — Telehealth: Payer: Self-pay | Admitting: Physical Therapy

## 2024-07-27 ENCOUNTER — Ambulatory Visit: Payer: MEDICAID | Attending: Advanced Practice Midwife | Admitting: Physical Therapy

## 2024-07-27 DIAGNOSIS — M6281 Muscle weakness (generalized): Secondary | ICD-10-CM | POA: Insufficient documentation

## 2024-07-27 DIAGNOSIS — R102 Pelvic and perineal pain unspecified side: Secondary | ICD-10-CM | POA: Insufficient documentation

## 2024-07-27 DIAGNOSIS — M5459 Other low back pain: Secondary | ICD-10-CM | POA: Insufficient documentation

## 2024-07-27 NOTE — Telephone Encounter (Signed)
 Phoned patient re: missed appt this morning and she overslept. Let her know her next appt is 10/16 at 11:00 am.
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
## 2024-08-02 ENCOUNTER — Ambulatory Visit: Payer: MEDICAID

## 2024-08-02 DIAGNOSIS — R102 Pelvic and perineal pain unspecified side: Secondary | ICD-10-CM

## 2024-08-02 DIAGNOSIS — M5459 Other low back pain: Secondary | ICD-10-CM

## 2024-08-02 DIAGNOSIS — M6281 Muscle weakness (generalized): Secondary | ICD-10-CM
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
## 2024-08-06 ENCOUNTER — Encounter: Payer: Self-pay | Admitting: Obstetrics

## 2024-08-06 ENCOUNTER — Ambulatory Visit: Payer: MEDICAID | Admitting: Obstetrics

## 2024-08-06 VITALS — BP 113/75 | HR 111 | Wt 177.9 lb

## 2024-08-06 DIAGNOSIS — F3162 Bipolar disorder, current episode mixed, moderate: Secondary | ICD-10-CM

## 2024-08-06 DIAGNOSIS — F1911 Other psychoactive substance abuse, in remission: Secondary | ICD-10-CM

## 2024-08-06 DIAGNOSIS — O99333 Smoking (tobacco) complicating pregnancy, third trimester: Secondary | ICD-10-CM

## 2024-08-06 DIAGNOSIS — O24410 Gestational diabetes mellitus in pregnancy, diet controlled: Secondary | ICD-10-CM

## 2024-08-06 DIAGNOSIS — A6000 Herpesviral infection of urogenital system, unspecified: Secondary | ICD-10-CM

## 2024-08-06 DIAGNOSIS — O0990 Supervision of high risk pregnancy, unspecified, unspecified trimester: Secondary | ICD-10-CM | POA: Diagnosis not present

## 2024-08-06 NOTE — Progress Notes (Signed)
 Pt presents for rob. Pt has no questions or concerns at this time.
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
## 2024-08-07 ENCOUNTER — Ambulatory Visit: Payer: MEDICAID | Admitting: Physical Therapy

## 2024-08-13 ENCOUNTER — Ambulatory Visit (INDEPENDENT_AMBULATORY_CARE_PROVIDER_SITE_OTHER): Payer: MEDICAID | Admitting: Obstetrics

## 2024-08-13 ENCOUNTER — Other Ambulatory Visit (HOSPITAL_COMMUNITY)
Admission: RE | Admit: 2024-08-13 | Discharge: 2024-08-13 | Disposition: A | Discharge status: Home / Self Care | Payer: MEDICAID | Source: Ambulatory Visit | Attending: Obstetrics | Admitting: Obstetrics

## 2024-08-13 ENCOUNTER — Encounter: Payer: Self-pay | Admitting: Obstetrics

## 2024-08-13 VITALS — BP 113/71 | HR 103 | Wt 179.3 lb

## 2024-08-13 DIAGNOSIS — Z79899 Other long term (current) drug therapy: Secondary | ICD-10-CM | POA: Diagnosis not present

## 2024-08-13 DIAGNOSIS — O0990 Supervision of high risk pregnancy, unspecified, unspecified trimester: Secondary | ICD-10-CM | POA: Insufficient documentation

## 2024-08-13 DIAGNOSIS — O99343 Other mental disorders complicating pregnancy, third trimester: Secondary | ICD-10-CM

## 2024-08-13 DIAGNOSIS — O99333 Smoking (tobacco) complicating pregnancy, third trimester: Secondary | ICD-10-CM

## 2024-08-13 DIAGNOSIS — A6000 Herpesviral infection of urogenital system, unspecified: Secondary | ICD-10-CM

## 2024-08-13 DIAGNOSIS — Z3A36 36 weeks gestation of pregnancy: Secondary | ICD-10-CM

## 2024-08-13 DIAGNOSIS — O24410 Gestational diabetes mellitus in pregnancy, diet controlled: Secondary | ICD-10-CM | POA: Diagnosis not present

## 2024-08-13 DIAGNOSIS — F1911 Other psychoactive substance abuse, in remission: Secondary | ICD-10-CM

## 2024-08-13 DIAGNOSIS — F3162 Bipolar disorder, current episode mixed, moderate: Secondary | ICD-10-CM

## 2024-08-13 MED ORDER — VALACYCLOVIR HCL 1 G PO TABS: 1000.0000 mg | ORAL_TABLET | Freq: Every day | ORAL | 11 refills | Status: DC

## 2024-08-13 NOTE — Addendum Note (Signed)
 Addended by: MARCINE GAINS on: 08/13/2024 10:36 AM   Modules accepted: Orders

## 2024-08-13 NOTE — Progress Notes (Signed)
 Pt presents for rob. Pt has no question or concerns at this time.
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
## 2024-08-13 NOTE — Addendum Note (Signed)
 Addended by: MARCINE GAINS on: 08/13/2024 01:38 PM   Modules accepted: Orders

## 2024-08-14 ENCOUNTER — Ambulatory Visit: Payer: MEDICAID | Admitting: Physical Therapy

## 2024-08-14 ENCOUNTER — Ambulatory Visit: Payer: Self-pay | Admitting: Obstetrics

## 2024-08-14 DIAGNOSIS — B3731 Acute candidiasis of vulva and vagina: Secondary | ICD-10-CM

## 2024-08-14 LAB — CERVICOVAGINAL ANCILLARY ONLY
Bacterial Vaginitis (gardnerella): NEGATIVE
Candida Glabrata: NEGATIVE
Candida Vaginitis: POSITIVE — AB
Chlamydia: NEGATIVE
Comment: NEGATIVE
Comment: NEGATIVE
Comment: NEGATIVE
Comment: NEGATIVE
Comment: NEGATIVE
Comment: NORMAL
Neisseria Gonorrhea: NEGATIVE
Trichomonas: NEGATIVE

## 2024-08-14 MED ORDER — TERCONAZOLE 0.8 % VA CREA: 1.0000 | TOPICAL_CREAM | Freq: Every day | VAGINAL | 0 refills | Status: DC
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
## 2024-08-15 ENCOUNTER — Ambulatory Visit: Payer: MEDICAID | Attending: Maternal & Fetal Medicine | Admitting: *Deleted

## 2024-08-15 ENCOUNTER — Ambulatory Visit: Payer: MEDICAID | Admitting: *Deleted

## 2024-08-15 VITALS — BP 106/68 | HR 112

## 2024-08-15 DIAGNOSIS — O24410 Gestational diabetes mellitus in pregnancy, diet controlled: Secondary | ICD-10-CM | POA: Insufficient documentation

## 2024-08-15 DIAGNOSIS — O24419 Gestational diabetes mellitus in pregnancy, unspecified control: Secondary | ICD-10-CM

## 2024-08-15 DIAGNOSIS — Z3A36 36 weeks gestation of pregnancy: Secondary | ICD-10-CM

## 2024-08-15 NOTE — Procedures (Signed)
 Tiffany Wong 16-Feb-1991 [redacted]w[redacted]d  Fetus A Non-Stress Test Interpretation for 08/15/24- NST only  Indication: Gestational Diabetes medication controlled  Fetal Heart Rate A Mode: External Baseline Rate (A): 130 bpm Variability: Minimal, Moderate Accelerations: 15 x 15 Decelerations: Variable Multiple birth?: No  Uterine Activity Mode: Toco Contraction Frequency (min): occas Contraction Duration (sec): 60 Contraction Quality: Mild Resting Tone Palpated: Relaxed  Interpretation (Fetal Testing) Nonstress Test Interpretation: Reactive Comments: Tracing reviewed by Dr. ileana

## 2024-08-15 NOTE — Progress Notes (Signed)
 Tiffany Wong

## 2024-08-17 LAB — CULTURE, BETA STREP (GROUP B ONLY): Strep Gp B Culture: NEGATIVE

## 2024-08-21 ENCOUNTER — Ambulatory Visit: Payer: MEDICAID | Attending: Advanced Practice Midwife | Admitting: Physical Therapy

## 2024-08-21 ENCOUNTER — Telehealth: Payer: Self-pay | Admitting: Physical Therapy

## 2024-08-21 DIAGNOSIS — M5459 Other low back pain: Secondary | ICD-10-CM | POA: Insufficient documentation

## 2024-08-21 DIAGNOSIS — M6281 Muscle weakness (generalized): Secondary | ICD-10-CM | POA: Insufficient documentation

## 2024-08-21 DIAGNOSIS — R102 Pelvic and perineal pain unspecified side: Secondary | ICD-10-CM | POA: Insufficient documentation

## 2024-08-21 NOTE — Telephone Encounter (Addendum)
 Spoke with patient about missed appointment. She was not aware she had an appointment today. Was able to get patient rescheduled for 11/5 12:30  Kristeen Sar, PT, DPT 08/21/24 10:40 AM

## 2024-08-22 ENCOUNTER — Ambulatory Visit: Payer: MEDICAID

## 2024-08-23 ENCOUNTER — Ambulatory Visit: Payer: MEDICAID

## 2024-08-23 ENCOUNTER — Encounter: Payer: Self-pay | Admitting: Obstetrics

## 2024-08-23 ENCOUNTER — Ambulatory Visit (INDEPENDENT_AMBULATORY_CARE_PROVIDER_SITE_OTHER): Payer: MEDICAID | Admitting: Obstetrics

## 2024-08-23 VITALS — BP 120/80 | HR 126 | Wt 180.3 lb

## 2024-08-23 DIAGNOSIS — O0990 Supervision of high risk pregnancy, unspecified, unspecified trimester: Secondary | ICD-10-CM | POA: Diagnosis not present

## 2024-08-23 DIAGNOSIS — O26893 Other specified pregnancy related conditions, third trimester: Secondary | ICD-10-CM

## 2024-08-23 DIAGNOSIS — O24410 Gestational diabetes mellitus in pregnancy, diet controlled: Secondary | ICD-10-CM | POA: Diagnosis not present

## 2024-08-23 DIAGNOSIS — R12 Heartburn: Secondary | ICD-10-CM

## 2024-08-23 DIAGNOSIS — F3162 Bipolar disorder, current episode mixed, moderate: Secondary | ICD-10-CM

## 2024-08-23 DIAGNOSIS — F411 Generalized anxiety disorder: Secondary | ICD-10-CM

## 2024-08-23 MED ORDER — PANTOPRAZOLE SODIUM 40 MG PO TBEC: 40.0000 mg | DELAYED_RELEASE_TABLET | Freq: Two times a day (BID) | ORAL | 5 refills | Status: DC
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
## 2024-08-23 NOTE — Progress Notes (Signed)
 Pt presents for rob. Pt complains of acid reflux during the night. Pt has questions about water  birth. No other questions or concerns at this time.
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
## 2024-08-27 ENCOUNTER — Telehealth (INDEPENDENT_AMBULATORY_CARE_PROVIDER_SITE_OTHER): Payer: MEDICAID | Admitting: Psychiatry

## 2024-08-27 ENCOUNTER — Encounter (HOSPITAL_COMMUNITY): Payer: Self-pay | Admitting: Psychiatry

## 2024-08-27 DIAGNOSIS — F1221 Cannabis dependence, in remission: Secondary | ICD-10-CM | POA: Diagnosis not present

## 2024-08-27 DIAGNOSIS — F411 Generalized anxiety disorder: Secondary | ICD-10-CM

## 2024-08-27 DIAGNOSIS — F4310 Post-traumatic stress disorder, unspecified: Secondary | ICD-10-CM | POA: Diagnosis not present

## 2024-08-27 DIAGNOSIS — F319 Bipolar disorder, unspecified: Secondary | ICD-10-CM

## 2024-08-27 MED ORDER — BUSPIRONE HCL 15 MG PO TABS: 15.0000 mg | ORAL_TABLET | Freq: Two times a day (BID) | ORAL | 2 refills | Status: DC

## 2024-08-27 MED ORDER — LAMOTRIGINE 200 MG PO TABS: 200.0000 mg | ORAL_TABLET | Freq: Every day | ORAL | 2 refills | Status: DC

## 2024-08-27 MED ORDER — OLANZAPINE 10 MG PO TABS: ORAL_TABLET | ORAL | 2 refills | Status: DC

## 2024-08-27 NOTE — Patient Instructions (Signed)

## 2024-08-28 ENCOUNTER — Encounter: Payer: MEDICAID | Admitting: Physical Therapy

## 2024-08-28 ENCOUNTER — Ambulatory Visit (INDEPENDENT_AMBULATORY_CARE_PROVIDER_SITE_OTHER): Payer: MEDICAID | Admitting: Advanced Practice Midwife

## 2024-08-28 VITALS — BP 117/77 | HR 101 | Wt 182.4 lb

## 2024-08-28 DIAGNOSIS — F3162 Bipolar disorder, current episode mixed, moderate: Secondary | ICD-10-CM | POA: Diagnosis not present

## 2024-08-28 DIAGNOSIS — Z3A38 38 weeks gestation of pregnancy: Secondary | ICD-10-CM | POA: Diagnosis not present

## 2024-08-28 DIAGNOSIS — O24410 Gestational diabetes mellitus in pregnancy, diet controlled: Secondary | ICD-10-CM | POA: Diagnosis not present

## 2024-08-28 DIAGNOSIS — Z3493 Encounter for supervision of normal pregnancy, unspecified, third trimester: Secondary | ICD-10-CM
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
## 2024-08-28 NOTE — Progress Notes (Signed)
 Pt presents for rob. Pt has no questions or concerns at this time.

## 2024-08-29 ENCOUNTER — Ambulatory Visit: Payer: MEDICAID | Admitting: *Deleted

## 2024-08-29 ENCOUNTER — Ambulatory Visit: Payer: MEDICAID

## 2024-08-29 ENCOUNTER — Other Ambulatory Visit: Payer: Self-pay | Admitting: *Deleted

## 2024-08-29 ENCOUNTER — Ambulatory Visit: Payer: MEDICAID | Attending: Obstetrics and Gynecology | Admitting: Obstetrics and Gynecology

## 2024-08-29 ENCOUNTER — Ambulatory Visit: Payer: MEDICAID | Attending: Maternal & Fetal Medicine | Admitting: *Deleted

## 2024-08-29 VITALS — BP 109/61 | HR 120

## 2024-08-29 DIAGNOSIS — O24410 Gestational diabetes mellitus in pregnancy, diet controlled: Secondary | ICD-10-CM | POA: Insufficient documentation

## 2024-08-29 DIAGNOSIS — Z3493 Encounter for supervision of normal pregnancy, unspecified, third trimester: Secondary | ICD-10-CM

## 2024-08-29 DIAGNOSIS — O4443 Low lying placenta NOS or without hemorrhage, third trimester: Secondary | ICD-10-CM

## 2024-08-29 DIAGNOSIS — O99343 Other mental disorders complicating pregnancy, third trimester: Secondary | ICD-10-CM | POA: Diagnosis not present

## 2024-08-29 DIAGNOSIS — F1911 Other psychoactive substance abuse, in remission: Secondary | ICD-10-CM

## 2024-08-29 DIAGNOSIS — O99333 Smoking (tobacco) complicating pregnancy, third trimester: Secondary | ICD-10-CM | POA: Insufficient documentation

## 2024-08-29 DIAGNOSIS — Z3A38 38 weeks gestation of pregnancy: Secondary | ICD-10-CM | POA: Insufficient documentation

## 2024-08-29 DIAGNOSIS — O289 Unspecified abnormal findings on antenatal screening of mother: Secondary | ICD-10-CM | POA: Diagnosis present

## 2024-08-29 DIAGNOSIS — Z362 Encounter for other antenatal screening follow-up: Secondary | ICD-10-CM | POA: Insufficient documentation

## 2024-08-29 NOTE — Progress Notes (Signed)
 Maternal-Fetal Medicine Consultation  Name: Tiffany Wong  MRN: [id number]  GA: H3E6976 [redacted]w[redacted]d   Gestational diabetes.  Reportedly well-controlled on diet.  Patient is here for nonstress test.  NST is not reactive.  Ultrasound Normal fetal growth and amniotic fluid.  Cephalic presentation.  Antenatal testing is reassuring.  BPP 8/10.  Patient has not brought her logbook but reports about 70% of her blood glucose levels are within normal range.  She is not taking oral hypoglycemics.  We discussed timing of delivery.  If diabetes is well-controlled on diet, she may be delivered at 76 to 40-weeks' gestation.  Suboptimal control of diabetes associated with increased risk of perinatal mortality. I recommended delivery around [redacted] weeks gestation.  She has a prenatal visit next week. In-basket message will be sent to her provider.      Consultation including face-to-face (more than 50%) counseling 20 minutes.

## 2024-08-29 NOTE — Procedures (Signed)
 Tiffany Wong [birth year] [redacted]w[redacted]d  Fetus A Non-Stress Test Interpretation for 08/29/24- NST with BPP  Indication: Gestational Diabetes medication controlled  Fetal Heart Rate A Mode: External Baseline Rate (A): 130 bpm Variability: Minimal Accelerations: 10 x 10 Decelerations: Variable Multiple birth?: No  Uterine Activity Mode: Toco Contraction Frequency (min): occas Contraction Duration (sec): 60-70 Contraction Quality: Mild Resting Tone Palpated: Relaxed  Interpretation (Fetal Testing) Nonstress Test Interpretation: Non-reactive Comments: Tracing reviewed by Dr. arna

## 2024-09-04 ENCOUNTER — Inpatient Hospital Stay (HOSPITAL_COMMUNITY)
Admission: AD | Admit: 2024-09-04 | Discharge: 2024-09-07 | DRG: 806 | Disposition: A | Discharge status: Home / Self Care | Payer: MEDICAID | Attending: Obstetrics and Gynecology | Admitting: Obstetrics and Gynecology

## 2024-09-04 ENCOUNTER — Encounter (HOSPITAL_COMMUNITY): Payer: Self-pay | Admitting: Obstetrics and Gynecology

## 2024-09-04 ENCOUNTER — Encounter: Payer: MEDICAID | Admitting: Physical Therapy

## 2024-09-04 ENCOUNTER — Other Ambulatory Visit: Payer: Self-pay

## 2024-09-04 ENCOUNTER — Ambulatory Visit: Payer: MEDICAID | Admitting: Obstetrics and Gynecology

## 2024-09-04 ENCOUNTER — Encounter: Payer: Self-pay | Admitting: Obstetrics and Gynecology

## 2024-09-04 VITALS — BP 126/83 | HR 135 | Wt 184.5 lb

## 2024-09-04 DIAGNOSIS — Z79899 Other long term (current) drug therapy: Secondary | ICD-10-CM

## 2024-09-04 DIAGNOSIS — O26613 Liver and biliary tract disorders in pregnancy, third trimester: Secondary | ICD-10-CM | POA: Diagnosis not present

## 2024-09-04 DIAGNOSIS — F3162 Bipolar disorder, current episode mixed, moderate: Secondary | ICD-10-CM | POA: Diagnosis present

## 2024-09-04 DIAGNOSIS — O24410 Gestational diabetes mellitus in pregnancy, diet controlled: Secondary | ICD-10-CM | POA: Diagnosis not present

## 2024-09-04 DIAGNOSIS — Z3493 Encounter for supervision of normal pregnancy, unspecified, third trimester: Secondary | ICD-10-CM

## 2024-09-04 DIAGNOSIS — F411 Generalized anxiety disorder: Secondary | ICD-10-CM | POA: Diagnosis present

## 2024-09-04 DIAGNOSIS — F17210 Nicotine dependence, cigarettes, uncomplicated: Secondary | ICD-10-CM | POA: Diagnosis present

## 2024-09-04 DIAGNOSIS — Z5982 Transportation insecurity: Secondary | ICD-10-CM | POA: Diagnosis not present

## 2024-09-04 DIAGNOSIS — O24420 Gestational diabetes mellitus in childbirth, diet controlled: Principal | ICD-10-CM | POA: Diagnosis present

## 2024-09-04 DIAGNOSIS — O99344 Other mental disorders complicating childbirth: Secondary | ICD-10-CM | POA: Diagnosis present

## 2024-09-04 DIAGNOSIS — Z560 Unemployment, unspecified: Secondary | ICD-10-CM | POA: Diagnosis not present

## 2024-09-04 DIAGNOSIS — O24424 Gestational diabetes mellitus in childbirth, insulin controlled: Secondary | ICD-10-CM | POA: Diagnosis not present

## 2024-09-04 DIAGNOSIS — A6000 Herpesviral infection of urogenital system, unspecified: Secondary | ICD-10-CM

## 2024-09-04 DIAGNOSIS — O9832 Other infections with a predominantly sexual mode of transmission complicating childbirth: Secondary | ICD-10-CM | POA: Diagnosis present

## 2024-09-04 DIAGNOSIS — O99333 Smoking (tobacco) complicating pregnancy, third trimester: Secondary | ICD-10-CM | POA: Diagnosis present

## 2024-09-04 DIAGNOSIS — Z3A39 39 weeks gestation of pregnancy: Secondary | ICD-10-CM | POA: Diagnosis not present

## 2024-09-04 DIAGNOSIS — Z8249 Family history of ischemic heart disease and other diseases of the circulatory system: Secondary | ICD-10-CM

## 2024-09-04 DIAGNOSIS — D62 Acute posthemorrhagic anemia: Secondary | ICD-10-CM | POA: Diagnosis not present

## 2024-09-04 DIAGNOSIS — O9081 Anemia of the puerperium: Secondary | ICD-10-CM | POA: Diagnosis not present

## 2024-09-04 DIAGNOSIS — O99334 Smoking (tobacco) complicating childbirth: Secondary | ICD-10-CM | POA: Diagnosis present

## 2024-09-04 DIAGNOSIS — O24419 Gestational diabetes mellitus in pregnancy, unspecified control: Principal | ICD-10-CM | POA: Diagnosis present

## 2024-09-04 DIAGNOSIS — Z3490 Encounter for supervision of normal pregnancy, unspecified, unspecified trimester: Secondary | ICD-10-CM | POA: Insufficient documentation

## 2024-09-04 LAB — CBC
HCT: 24.9 % — ABNORMAL LOW (ref 36.0–46.0)
Hemoglobin: 8.4 g/dL — ABNORMAL LOW (ref 12.0–15.0)
MCH: 29.9 pg (ref 26.0–34.0)
MCHC: 33.7 g/dL (ref 30.0–36.0)
MCV: 88.6 fL (ref 80.0–100.0)
Platelets: 269 K/uL (ref 150–400)
RBC: 2.81 MIL/uL — ABNORMAL LOW (ref 3.87–5.11)
RDW: 14.9 % (ref 11.5–15.5)
WBC: 13.4 K/uL — ABNORMAL HIGH (ref 4.0–10.5)
nRBC: 0.4 % — ABNORMAL HIGH (ref 0.0–0.2)

## 2024-09-04 LAB — GLUCOSE, CAPILLARY
Glucose-Capillary: 112 mg/dL — ABNORMAL HIGH (ref 70–99)
Glucose-Capillary: 150 mg/dL — ABNORMAL HIGH (ref 70–99)
Glucose-Capillary: 111 mg/dL — ABNORMAL HIGH (ref 70–99)

## 2024-09-04 LAB — TYPE AND SCREEN
ABO/RH(D): B POS
Antibody Screen: NEGATIVE

## 2024-09-04 MED ORDER — LIDOCAINE HCL (PF) 1 % IJ SOLN: 30.0000 mL | INTRAMUSCULAR | Status: DC | PRN

## 2024-09-04 MED ORDER — LACTATED RINGERS IV SOLN: INTRAVENOUS | Status: DC

## 2024-09-04 MED ORDER — NICOTINE 7 MG/24HR TD PT24
7.0000 mg | MEDICATED_PATCH | Freq: Every day | TRANSDERMAL | Status: DC
  Filled 2024-09-04: qty 1

## 2024-09-04 MED ORDER — OLANZAPINE 10 MG PO TABS
10.0000 mg | ORAL_TABLET | Freq: Every day | ORAL | Status: DC
  Administered 2024-09-04: 10 mg via ORAL
  Filled 2024-09-04 (×2): qty 1

## 2024-09-04 MED ORDER — ONDANSETRON HCL 4 MG/2ML IJ SOLN: 4.0000 mg | Freq: Four times a day (QID) | INTRAMUSCULAR | Status: DC | PRN

## 2024-09-04 MED ORDER — BUSPIRONE HCL 5 MG PO TABS
15.0000 mg | ORAL_TABLET | Freq: Two times a day (BID) | ORAL | Status: DC
  Administered 2024-09-04 – 2024-09-05 (×2): 15 mg via ORAL
  Filled 2024-09-04 (×3): qty 1

## 2024-09-04 MED ORDER — FENTANYL CITRATE (PF) 100 MCG/2ML IJ SOLN
50.0000 ug | INTRAMUSCULAR | Status: DC | PRN
  Administered 2024-09-05: 50 ug via INTRAVENOUS
  Filled 2024-09-04: qty 2

## 2024-09-04 MED ORDER — VALACYCLOVIR HCL 500 MG PO TABS
1000.0000 mg | ORAL_TABLET | Freq: Every day | ORAL | Status: DC
  Administered 2024-09-05: 1000 mg via ORAL
  Filled 2024-09-04: qty 2

## 2024-09-04 MED ORDER — LAMOTRIGINE 100 MG PO TABS
200.0000 mg | ORAL_TABLET | Freq: Every day | ORAL | Status: DC
  Administered 2024-09-05: 200 mg via ORAL
  Filled 2024-09-04: qty 2

## 2024-09-04 MED ORDER — ACETAMINOPHEN 325 MG PO TABS: 650.0000 mg | ORAL_TABLET | ORAL | Status: DC | PRN

## 2024-09-04 MED ORDER — SOD CITRATE-CITRIC ACID 500-334 MG/5ML PO SOLN
30.0000 mL | ORAL | Status: DC | PRN
Start: 2024-09-04 — End: 2024-09-05

## 2024-09-04 MED ORDER — TERBUTALINE SULFATE 1 MG/ML IJ SOLN: 0.2500 mg | Freq: Once | INTRAMUSCULAR | Status: DC | PRN

## 2024-09-04 MED ORDER — OXYTOCIN BOLUS FROM INFUSION
333.0000 mL | Freq: Once | INTRAVENOUS | Status: AC
  Administered 2024-09-05: 333 mL via INTRAVENOUS

## 2024-09-04 MED ORDER — MISOPROSTOL 25 MCG QUARTER TABLET
25.0000 ug | ORAL_TABLET | Freq: Once | ORAL | Status: AC
  Administered 2024-09-04: 25 ug via VAGINAL
  Filled 2024-09-04: qty 1

## 2024-09-04 MED ORDER — LACTATED RINGERS IV SOLN
500.0000 mL | INTRAVENOUS | Status: DC | PRN
  Administered 2024-09-04 – 2024-09-05 (×2): 500 mL via INTRAVENOUS

## 2024-09-04 MED ORDER — PANTOPRAZOLE SODIUM 40 MG PO TBEC: 40.0000 mg | DELAYED_RELEASE_TABLET | Freq: Two times a day (BID) | ORAL | Status: DC

## 2024-09-04 MED ORDER — OLANZAPINE 5 MG PO TABS
5.0000 mg | ORAL_TABLET | Freq: Every day | ORAL | Status: DC
  Administered 2024-09-05: 5 mg via ORAL
  Filled 2024-09-04: qty 1

## 2024-09-04 MED ORDER — PANTOPRAZOLE SODIUM 40 MG PO TBEC
40.0000 mg | DELAYED_RELEASE_TABLET | Freq: Two times a day (BID) | ORAL | Status: DC
  Administered 2024-09-04 – 2024-09-05 (×2): 40 mg via ORAL
  Filled 2024-09-04 (×2): qty 1

## 2024-09-04 MED ORDER — VALACYCLOVIR HCL 500 MG PO TABS: 500.0000 mg | ORAL_TABLET | Freq: Two times a day (BID) | ORAL | Status: DC

## 2024-09-04 MED ORDER — OXYTOCIN-SODIUM CHLORIDE 30-0.9 UT/500ML-% IV SOLN
1.0000 m[IU]/min | INTRAVENOUS | Status: DC
  Administered 2024-09-04: 2 m[IU]/min via INTRAVENOUS
  Administered 2024-09-05: 16 m[IU]/min via INTRAVENOUS

## 2024-09-04 MED ORDER — OXYTOCIN-SODIUM CHLORIDE 30-0.9 UT/500ML-% IV SOLN
2.5000 [IU]/h | INTRAVENOUS | Status: DC
  Administered 2024-09-05: 2.5 [IU]/h via INTRAVENOUS
  Filled 2024-09-04: qty 500
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
## 2024-09-04 NOTE — Progress Notes (Signed)
 GDM; pt has reading on her phone. States she has had some high ones the last few days.

## 2024-09-04 NOTE — Progress Notes (Signed)
 Tiffany Wong is a 33 y.o. H4E6986 at [redacted]w[redacted]d by ultrasound admitted for induction of labor due to Gestational diabetes.  Subjective: Tiffany Wong is doing well, she is utilizing nitrous for her contractions.   Objective: BP 120/62   Pulse 98   Temp 97.8 F (36.6 C) (Oral)   Resp 16   Ht 5' 2 (1.575 m)   Wt 83.2 kg   LMP 11/27/2023 (Approximate)   SpO2 99%   BMI 33.56 kg/m  No intake/output data recorded. No intake/output data recorded.  FHT:  FHR: 140 bpm, variability: moderate,  accelerations:  Present,  decelerations:  Absent UC:   irregular, every 7-10 minutes SVE:   Dilation: 2 Effacement (%): 50 Station: Ballotable Exam by:: [employer], DO  Labs: Lab Results  Component Value Date   WBC 13.4 (H) 09/04/2024   HGB 8.4 (L) 09/04/2024   HCT 24.9 (L) 09/04/2024   MCV 88.6 09/04/2024   PLT 269 09/04/2024    Assessment / Plan: Induction of labor due to gestational diabetes,  progressing well on pitocin   Labor: S/p 1 dose Cytotec , plan to start Pitocin  and AROM at next check or when appropriate.   GDM:  One blood sugar out of range (150) after she drank several juices, recheck appropriate an hour later. Will continue to monitor.   Fetal Wellbeing:  Category I Pain Control:  Nitrous Oxide I/D:  GBS neg, Membranes intact Anticipated MOD:  NSVD  [name], Student-MidWife 09/04/2024, 11:11 PM

## 2024-09-05 ENCOUNTER — Encounter (HOSPITAL_COMMUNITY): Payer: Self-pay | Admitting: Obstetrics and Gynecology

## 2024-09-05 ENCOUNTER — Inpatient Hospital Stay (HOSPITAL_COMMUNITY): Payer: MEDICAID | Admitting: Anesthesiology

## 2024-09-05 DIAGNOSIS — O24424 Gestational diabetes mellitus in childbirth, insulin controlled: Secondary | ICD-10-CM

## 2024-09-05 DIAGNOSIS — O26613 Liver and biliary tract disorders in pregnancy, third trimester: Secondary | ICD-10-CM

## 2024-09-05 DIAGNOSIS — O99334 Smoking (tobacco) complicating childbirth: Secondary | ICD-10-CM

## 2024-09-05 DIAGNOSIS — Z3A39 39 weeks gestation of pregnancy: Secondary | ICD-10-CM

## 2024-09-05 DIAGNOSIS — O99344 Other mental disorders complicating childbirth: Secondary | ICD-10-CM

## 2024-09-05 LAB — GLUCOSE, CAPILLARY
Glucose-Capillary: 105 mg/dL — ABNORMAL HIGH (ref 70–99)
Glucose-Capillary: 127 mg/dL — ABNORMAL HIGH (ref 70–99)
Glucose-Capillary: 75 mg/dL (ref 70–99)

## 2024-09-05 LAB — RPR: RPR Ser Ql: NONREACTIVE

## 2024-09-05 MED ORDER — INSULIN ASPART 100 UNIT/ML IJ SOLN: 0.0000 [IU] | INTRAMUSCULAR | Status: DC

## 2024-09-05 MED ORDER — COCONUT OIL OIL
1.0000 | TOPICAL_OIL | Status: DC | PRN
  Administered 2024-09-06: 1 via TOPICAL

## 2024-09-05 MED ORDER — ONDANSETRON HCL 4 MG PO TABS: 4.0000 mg | ORAL_TABLET | ORAL | Status: DC | PRN

## 2024-09-05 MED ORDER — PHENYLEPHRINE 80 MCG/ML (10ML) SYRINGE FOR IV PUSH (FOR BLOOD PRESSURE SUPPORT): 80.0000 ug | PREFILLED_SYRINGE | INTRAVENOUS | Status: DC | PRN

## 2024-09-05 MED ORDER — BUSPIRONE HCL 5 MG PO TABS
15.0000 mg | ORAL_TABLET | Freq: Two times a day (BID) | ORAL | Status: DC
  Administered 2024-09-05 – 2024-09-07 (×4): 15 mg via ORAL
  Filled 2024-09-05 (×5): qty 3

## 2024-09-05 MED ORDER — WITCH HAZEL-GLYCERIN EX PADS: 1.0000 | MEDICATED_PAD | CUTANEOUS | Status: DC | PRN

## 2024-09-05 MED ORDER — DIPHENHYDRAMINE HCL 25 MG PO CAPS: 25.0000 mg | ORAL_CAPSULE | Freq: Four times a day (QID) | ORAL | Status: DC | PRN

## 2024-09-05 MED ORDER — TETANUS-DIPHTH-ACELL PERTUSSIS 5-2-15.5 LF-MCG/0.5 IM SUSP: 0.5000 mL | Freq: Once | INTRAMUSCULAR | Status: DC

## 2024-09-05 MED ORDER — SENNOSIDES-DOCUSATE SODIUM 8.6-50 MG PO TABS
2.0000 | ORAL_TABLET | ORAL | Status: DC
  Administered 2024-09-06: 2 via ORAL
  Filled 2024-09-05: qty 2

## 2024-09-05 MED ORDER — OXYCODONE HCL 5 MG PO TABS
10.0000 mg | ORAL_TABLET | ORAL | Status: DC | PRN
  Administered 2024-09-06: 10 mg via ORAL
  Filled 2024-09-05: qty 2

## 2024-09-05 MED ORDER — DIBUCAINE (PERIANAL) 1 % EX OINT: 1.0000 | TOPICAL_OINTMENT | CUTANEOUS | Status: DC | PRN

## 2024-09-05 MED ORDER — PANTOPRAZOLE SODIUM 40 MG PO TBEC
40.0000 mg | DELAYED_RELEASE_TABLET | Freq: Two times a day (BID) | ORAL | Status: DC
  Administered 2024-09-05 – 2024-09-07 (×4): 40 mg via ORAL
  Filled 2024-09-05 (×4): qty 1

## 2024-09-05 MED ORDER — BENZOCAINE-MENTHOL 20-0.5 % EX AERO
1.0000 | INHALATION_SPRAY | CUTANEOUS | Status: DC | PRN
  Administered 2024-09-05: 1 via TOPICAL
  Filled 2024-09-05: qty 56

## 2024-09-05 MED ORDER — PRENATAL MULTIVITAMIN CH
1.0000 | ORAL_TABLET | Freq: Every day | ORAL | Status: DC
  Administered 2024-09-06 – 2024-09-07 (×2): 1 via ORAL
  Filled 2024-09-05 (×2): qty 1

## 2024-09-05 MED ORDER — FENTANYL-BUPIVACAINE-NACL 0.5-0.125-0.9 MG/250ML-% EP SOLN
12.0000 mL/h | EPIDURAL | Status: DC | PRN
  Administered 2024-09-05: 12 mL/h via EPIDURAL
  Filled 2024-09-05: qty 250

## 2024-09-05 MED ORDER — LIDOCAINE HCL (PF) 1 % IJ SOLN
INTRAMUSCULAR | Status: DC | PRN
  Administered 2024-09-05: 11 mL via EPIDURAL

## 2024-09-05 MED ORDER — EPHEDRINE 5 MG/ML INJ
10.0000 mg | INTRAVENOUS | Status: DC | PRN
Start: 2024-09-05 — End: 2024-09-05

## 2024-09-05 MED ORDER — ACETAMINOPHEN 325 MG PO TABS
650.0000 mg | ORAL_TABLET | ORAL | Status: DC | PRN
Start: 2024-09-05 — End: 2024-09-07
  Administered 2024-09-05 – 2024-09-07 (×4): 650 mg via ORAL
  Filled 2024-09-05 (×4): qty 2

## 2024-09-05 MED ORDER — LAMOTRIGINE 100 MG PO TABS
200.0000 mg | ORAL_TABLET | Freq: Every day | ORAL | Status: DC
  Administered 2024-09-06 – 2024-09-07 (×2): 200 mg via ORAL
  Filled 2024-09-05 (×2): qty 2

## 2024-09-05 MED ORDER — MAGNESIUM HYDROXIDE 400 MG/5ML PO SUSP: 30.0000 mL | ORAL | Status: DC | PRN

## 2024-09-05 MED ORDER — ONDANSETRON HCL 4 MG/2ML IJ SOLN: 4.0000 mg | INTRAMUSCULAR | Status: DC | PRN

## 2024-09-05 MED ORDER — OXYCODONE HCL 5 MG PO TABS
5.0000 mg | ORAL_TABLET | ORAL | Status: DC | PRN
  Administered 2024-09-06: 5 mg via ORAL
  Filled 2024-09-05: qty 1

## 2024-09-05 MED ORDER — DIPHENHYDRAMINE HCL 50 MG/ML IJ SOLN: 12.5000 mg | INTRAMUSCULAR | Status: DC | PRN

## 2024-09-05 MED ORDER — OLANZAPINE 10 MG PO TABS
10.0000 mg | ORAL_TABLET | Freq: Every day | ORAL | Status: DC
  Administered 2024-09-05 – 2024-09-06 (×2): 10 mg via ORAL
  Filled 2024-09-05 (×3): qty 1

## 2024-09-05 MED ORDER — EPHEDRINE 5 MG/ML INJ: 10.0000 mg | INTRAVENOUS | Status: DC | PRN

## 2024-09-05 MED ORDER — OLANZAPINE 5 MG PO TABS
5.0000 mg | ORAL_TABLET | Freq: Every day | ORAL | Status: DC
  Administered 2024-09-06 – 2024-09-07 (×2): 5 mg via ORAL
  Filled 2024-09-05 (×2): qty 1

## 2024-09-05 MED ORDER — LACTATED RINGERS IV SOLN
500.0000 mL | Freq: Once | INTRAVENOUS | Status: AC
  Administered 2024-09-05: 500 mL via INTRAVENOUS

## 2024-09-05 MED ORDER — SIMETHICONE 80 MG PO CHEW
80.0000 mg | CHEWABLE_TABLET | ORAL | Status: DC | PRN
Start: 2024-09-05 — End: 2024-09-07

## 2024-09-05 MED ORDER — IBUPROFEN 600 MG PO TABS
600.0000 mg | ORAL_TABLET | Freq: Four times a day (QID) | ORAL | Status: DC
  Administered 2024-09-05 – 2024-09-07 (×7): 600 mg via ORAL
  Filled 2024-09-05 (×7): qty 1

## 2024-09-05 NOTE — Anesthesia Preprocedure Evaluation (Addendum)
 Anesthesia Evaluation  Patient identified by MRN, date of birth, ID band Patient awake    Reviewed: Allergy & Precautions, H&P , NPO status , Patient's Chart, lab work & pertinent test results  Airway Mallampati: II  TM Distance: >3 FB Neck ROM: full    Dental  (+) Teeth Intact   Pulmonary neg pulmonary ROS, Current Smoker   breath sounds clear to auscultation Wheezing: no inhaler, no recent URI. + Smoker.      Cardiovascular negative cardio ROS  Rhythm:regular Rate:Normal     Neuro/Psych negative neurological ROS  negative psych ROS   GI/Hepatic negative GI ROS, Neg liver ROS,,,  Endo/Other  diabetes    Renal/GU negative Renal ROS  negative genitourinary   Musculoskeletal negative musculoskeletal ROS (+)    Abdominal   Peds negative pediatric ROS (+)  Hematology negative hematology ROS (+)   Anesthesia Other Findings       Reproductive/Obstetrics negative OB ROS (+) Pregnancy                              Anesthesia Physical Anesthesia Plan  ASA: 3  Anesthesia Plan: Epidural   Post-op Pain Management:    Induction:   PONV Risk Score and Plan:   Airway Management Planned:   Additional Equipment:   Intra-op Plan:   Post-operative Plan:   Informed Consent: I have reviewed the patients History and Physical, chart, labs and discussed the procedure including the risks, benefits and alternatives for the proposed anesthesia with the patient or authorized representative who has indicated his/her understanding and acceptance.     Dental Advisory Given  Plan Discussed with:   Anesthesia Plan Comments: (Labs checked- platelets confirmed with RN in room. Fetal heart tracing, per RN, reported to be stable enough for sitting procedure. Discussed epidural, and patient consents to the procedure:  included risk of possible headache,backache, failed block, allergic reaction, and  nerve injury. This patient was asked if she had any questions or concerns before the procedure started. )         Anesthesia Quick Evaluation

## 2024-09-05 NOTE — Inpatient Diabetes Management (Signed)
 Inpatient Diabetes Program Recommendations  AACE/ADA: New Consensus Statement on Inpatient Glycemic Control (2015)  Target Ranges:  Prepandial:   less than 140 mg/dL      Peak postprandial:   less than 180 mg/dL (1-2 hours)      Critically ill patients:  140 - 180 mg/dL   Lab Results  Component Value Date   GLUCAP 75 09/05/2024   HGBA1C 5.3 03/26/2024    Review of Glycemic Control  Latest Reference Range & Units 09/04/24 17:24 09/04/24 21:30 09/04/24 22:59 09/05/24 03:19 09/05/24 07:22 09/05/24 11:56  Glucose-Capillary 70 - 99 mg/dL 887 (H) 849 (H) 888 (H) 105 (H) 127 (H) 75  (H): Data is abnormally high Diabetes history: GDM Outpatient Diabetes medications: none Current orders for Inpatient glycemic control: none  Inpatient Diabetes Program Recommendations:    Noted one CBG elevated above goal in pregnancy.  Consider adding Novolog 0-14 units Q4H if CBGs continue to exceed 120 mg/dL while laboring.   Thanks, Tinnie Minus, MSN, RNC-OB Diabetes Coordinator [phone] (8a-5p)

## 2024-09-05 NOTE — Discharge Summary (Signed)
 Postpartum Discharge Summary   Patient Name: Tiffany Wong DOB: 10-24-1990 MRN: [id number]  Date of admission: 09/04/2024 Delivery date:09/05/2024 Delivering provider: LOLA DONNICE HERO Date of discharge: 09/07/2024  Admitting diagnosis: Gestational diabetes [O24.419] Intrauterine pregnancy: [redacted]w[redacted]d     Secondary diagnosis:  Principal Problem:   Gestational diabetes Active Problems:   Use of tobacco in third trimester, reduced compared to prior to pregnancy   Long term current use of antipsychotic medication   Bipolar disorder, current episode mixed, moderate (HCC)   Genital herpes simplex   Gestational diabetes mellitus (GDM) in third trimester   Encounter for induction of labor  Additional problems: N/A    Discharge diagnosis: Term Pregnancy Delivered and GDM A1                                              Post partum procedures:N/A Augmentation: AROM, Pitocin , and Cytotec  Complications: None  Hospital course: Induction of Labor With Vaginal Delivery   33 y.o. yo [id number] at [redacted]w[redacted]d was admitted to the hospital 09/04/2024 for induction of labor.  Indication for induction: A1 DM.  Patient had an labor course complicated by: precipitous unattended delivery without complication. Membrane Rupture Time/Date: 4:53 AM,09/05/2024  Delivery Method:Vaginal, Spontaneous Operative Delivery:N/A Episiotomy: None Lacerations:  None Details of delivery can be found in separate delivery note.  Patient had an uncomplicated postpartum course. Patient is discharged home 09/07/24.  Newborn Data: Birth date:09/05/2024 Birth time:2:09 PM Gender:Female Living status:Living Apgars:9 ,9  Weight:3210 g  Magnesium  Sulfate received: No BMZ received: No Rhophylac:N/A MMR:N/A T-DaP:Given prenatally Flu: declined prenatally RSV Vaccine received: No Transfusion:No  Immunizations received: Immunization History  Administered Date(s) Administered   Influenza Split 07/08/2011   Influenza,inj,Quad  PF,6+ Mos 09/06/2018   Tdap 07/08/2011, 01/11/2019, 06/26/2024    Physical exam  Vitals:   09/06/24 0500 09/06/24 1431 09/06/24 1925 09/07/24 0523  BP: 104/67 134/73 109/69 131/81  Pulse: 77 82 85 60  Resp: 18 18 16 19   Temp: 98.3 F (36.8 C) 98.2 F (36.8 C) 98 F (36.7 C)   TempSrc:  Oral Oral   SpO2: 99% 100% 100% 100%  Weight:      Height:       General: alert, cooperative, and no distress Lochia: appropriate Uterine Fundus: firm Incision: N/A DVT Evaluation: No evidence of DVT seen on physical exam. Labs: Lab Results  Component Value Date   WBC 13.8 (H) 09/06/2024   HGB 11.0 (L) 09/06/2024   HCT 31.4 (L) 09/06/2024   MCV 86.0 09/06/2024   PLT 206 09/06/2024      Latest Ref Rng & Units 02/13/2024    1:20 PM  CMP  Total Protein 6.0 - 8.5 g/dL 7.7   Total Bilirubin 0.0 - 1.2 mg/dL <9.7   Alkaline Phos 44 - 121 IU/L 54   AST 0 - 40 IU/L 19   ALT 0 - 32 IU/L 8    Edinburgh Score:    09/05/2024    4:45 PM  Edinburgh Postnatal Depression Scale Screening Tool  I have been able to laugh and see the funny side of things. 0  I have looked forward with enjoyment to things. 1  I have blamed myself unnecessarily when things went wrong. 1  I have been anxious or worried for no good reason. 0  I have felt scared or panicky for no good reason. 0  Things  have been getting on top of me. 2  I have been so unhappy that I have had difficulty sleeping. 0  I have felt sad or miserable. 1  I have been so unhappy that I have been crying. 1  The thought of harming myself has occurred to me. 0  Edinburgh Postnatal Depression Scale Total 6   No data recorded  After visit meds:  Allergies as of 09/07/2024       Reactions   Aspirin Other (See Comments)   Makes me bleed, nose bleeds        Medication List     STOP taking these medications    Accu-Chek Guide Test test strip Generic drug: glucose blood   Accu-Chek Guide w/Device Kit   Accu-Chek Softclix Lancets  lancets   Blood Pressure Kit Devi   Dexcom G7 Sensor Misc   terconazole  0.8 % vaginal cream Commonly known as: TERAZOL 3    valACYclovir  1000 MG tablet Commonly known as: Valtrex        TAKE these medications    acetaminophen  325 MG tablet Commonly known as: Tylenol  Take 2 tablets (650 mg total) by mouth every 4 (four) hours as needed (for pain scale < 4).   busPIRone  15 MG tablet Commonly known as: BUSPAR  Take 1 tablet (15 mg total) by mouth 2 (two) times daily.   ibuprofen  600 MG tablet Commonly known as: ADVIL  Take 1 tablet (600 mg total) by mouth every 6 (six) hours.   lamoTRIgine  200 MG tablet Commonly known as: LAMICTAL  Take 1 tablet (200 mg total) by mouth daily.   nicotine  7 mg/24hr patch Commonly known as: NICODERM CQ  - dosed in mg/24 hr Place 1 patch (7 mg total) onto the skin daily. Remove before bedtime.   OLANZapine  10 MG tablet Commonly known as: ZYPREXA  Take 0.5 tablets (5 mg total) by mouth in the morning AND 1 tablet (10 mg total) at bedtime.   pantoprazole  40 MG tablet Commonly known as: Protonix  Take 1 tablet (40 mg total) by mouth 2 (two) times daily before a meal.   Prenatal Vitamin 27-0.8 MG Tabs Take 1 tablet by mouth daily.   senna-docusate 8.6-50 MG tablet Commonly known as: Senokot-S Take 2 tablets by mouth daily. Start taking on: September 08, 2024         Discharge home in stable condition Infant Feeding: Breast Infant Disposition:home with mother Discharge instruction: per After Visit Summary and Postpartum booklet. Activity: Advance as tolerated. Pelvic rest for 6 weeks.  Diet: routine diet Future Appointments: Future Appointments  Date Time Provider Department Center  09/19/2024  9:30 AM Harl Zane BRAVO, NP [facility] None  10/04/2024  8:00 AM [facility] None  10/04/2024  9:15 AM Rudy Carlin LABOR, MD [facility] None   Follow up Visit:   Please schedule this patient for a In person postpartum visit in 4 weeks  with the following provider: Any provider. Additional Postpartum F/U:Postpartum Depression checkup and 2 hour GTT  High risk pregnancy complicated by: GDM Delivery mode:  Vaginal, Spontaneous Anticipated Birth Control:  Plans Interval BTL   09/07/2024 Esraa Seres LITTIE Angles, MD

## 2024-09-05 NOTE — Lactation Note (Signed)
 This note was copied from a baby's chart. Lactation Consultation Note  Patient Name: Tiffany Wong Date: 09/05/2024 Age:34 hours Reason for consult: Initial assessment;Term;Maternal endocrine disorder (hx HSV, Cig use, hx MJ use)  P4- [name] reports that infant latches well, but she is concerned because she is not seeing any colostrum when she hand expresses. LC reviewed the difference between colostrum vs mature milk. With permission, LC demonstrated how to properly hand express. Both LC and [name] were able to express a large drop of colostrum with one expression. [name]'s breasts are tender to expression though. LC provided [name] with a manual pump because she has no home pump. LC reviewed how to order and electric pump through her insurance. LC reviewed nicotine  use with breastfeeding and how to do it with least exposure through breast milk until she is able to quit. LC used Hale's guidelines and CDC's guidelines. LC encouraged [name] to call for a latch assessment.  LC reviewed the first 24 hr birthday nap, day 2 cluster feeding, feeding infant on cue 8-12x in 24 hrs, not allowing infant to go over 3 hrs without a feeding, CDC milk storage guidelines, LC services handout and engorgement/breast acre. LC encouraged [name] to call for further assistance as needed.  Maternal Data Has patient been taught Hand Expression?: Yes Does the patient have breastfeeding experience prior to this delivery?: Yes How long did the patient breastfeed?: 4-5 months with first child, 11 months with second child and 15 months with third child  Feeding Mother's Current Feeding Choice: Breast Milk  Lactation Tools Discussed/Used Tools: Pump;Flanges Flange Size: 18 Breast pump type: Manual Pump Education: Setup, frequency, and cleaning;Milk Storage Reason for Pumping: [name] has no home pump Pumping frequency: 15-20 min every 3 hrs as needed  Interventions Interventions: Breast feeding basics reviewed;Hand  express;Hand pump;Education;LC Services brochure  Discharge Discharge Education: Engorgement and breast care;Warning signs for feeding baby Pump: Manual;Advised to call insurance company  Consult Status Consult Status: Follow-up Date: 09/06/24 Follow-up type: In-patient    Recardo Hoit BS, IBCLC 09/05/2024, 6:39 PM

## 2024-09-05 NOTE — Anesthesia Procedure Notes (Signed)
 Epidural Patient location during procedure: OB Start time: 09/05/2024 9:55 AM End time: 09/05/2024 10:11 AM  Staffing Anesthesiologist: Cleotilde Butler Dade, MD Performed: anesthesiologist   Preanesthetic Checklist Completed: patient identified, IV checked, site marked, risks and benefits discussed, surgical consent, monitors and equipment checked, pre-op evaluation and timeout performed  Epidural Patient position: sitting Prep: ChloraPrep Patient monitoring: heart rate, cardiac monitor, continuous pulse ox and blood pressure Approach: midline Location: L2-L3 Injection technique: LOR saline  Needle:  Needle type: Tuohy  Needle gauge: 17 G Needle length: 9 cm Needle insertion depth: 6 cm Catheter type: closed end flexible Catheter size: 20 Guage Catheter at skin depth: 10 cm Test dose: negative  Assessment Events: blood not aspirated, injection not painful, no injection resistance, no paresthesia and negative IV test  Additional Notes Reason for block:procedure for pain

## 2024-09-05 NOTE — Progress Notes (Signed)
 Tiffany Wong is a 33 y.o. [id number] at [redacted]w[redacted]d by ultrasound admitted for induction of labor due to Gestational diabetes.  Subjective:  Tiffany Wong is doing well overall, she is feeling discouraged that she has not progressed more.She is eager to get into labor.   Objective: BP 111/75   Pulse 89   Temp 97.9 F (36.6 C) (Oral)   Resp 16   Ht 5' 2 (1.575 m)   Wt 83.2 kg   LMP 11/27/2023 (Approximate)   SpO2 100%   BMI 33.56 kg/m  No intake/output data recorded. No intake/output data recorded.  FHT:  FHR: 140 bpm, variability: minimal ,  accelerations:  Present,  decelerations:  Absent UC:   regular, every 2-3 minutes SVE:   Dilation: 2.5 Effacement (%): 70 Station: -3 Exam by:: [employer] RN  Labs: Lab Results  Component Value Date   WBC 13.4 (H) 09/04/2024   HGB 8.4 (L) 09/04/2024   HCT 24.9 (L) 09/04/2024   MCV 88.6 09/04/2024   PLT 269 09/04/2024    Assessment / Plan: Induction of labor due to gestational diabetes,  progressing well on pitocin   Labor: Pitocin  at 12 mu, AROM for clear.  GDM:  Within range, will continue to monitor. Fetal Wellbeing:  Category II Pain Control:  Nitrous Oxide I/D:  GBS neg, AROM @0453  clr Anticipated MOD:  NSVD  [name], Student-MidWife 09/05/2024, 4:57 AM

## 2024-09-05 NOTE — Lactation Note (Signed)
 This note was copied from a baby's chart. Lactation Consultation Note  Patient Name: Tiffany Wong Date: 09/05/2024 Age:34 hours Reason for consult: Follow-up assessment;Term;Maternal endocrine disorder;Breastfeeding assistance;Mother's request  P4- [name] requested latching assistance because infant has been too tired to latch. LC assisted with placing infant in the cross cradle hold on the left breast in the chair. Infant was eager and latched with first attempt. Infant had a strong rhythmic pull. [name] reports that this has been the easiest infant has latched since her first feeding. LC reminded [name] of the first 24 hr birthday nap. LC encouraged [name] to call for further assistance as needed.  Maternal Data Has patient been taught Hand Expression?: Yes Does the patient have breastfeeding experience prior to this delivery?: Yes How long did the patient breastfeed?: 4-5 months with first child, 11 months with second child and 15 months with third child  Feeding Mother's Current Feeding Choice: Breast Milk  LATCH Score Latch: Grasps breast easily, tongue down, lips flanged, rhythmical sucking.  Audible Swallowing: A few with stimulation  Type of Nipple: Everted at rest and after stimulation  Comfort (Breast/Nipple): Soft / non-tender  Hold (Positioning): Assistance needed to correctly position infant at breast and maintain latch.  LATCH Score: 8   Lactation Tools Discussed/Used Tools: Pump;Flanges Flange Size: 18 Breast pump type: Manual Pump Education: Setup, frequency, and cleaning;Milk Storage Reason for Pumping: [name] request Pumping frequency: 15-20 min every 3 hrs  Interventions Interventions: Breast feeding basics reviewed;Assisted with latch;Breast compression;Adjust position;Support pillows;Position options;Education;LC Services brochure  Discharge Discharge Education: Engorgement and breast care;Warning signs for feeding baby Pump: Manual;Advised to call  insurance company  Consult Status Consult Status: Follow-up Date: 09/06/24 Follow-up type: In-patient    Recardo Hoit BS, IBCLC 09/05/2024, 10:19 PM

## 2024-09-06 LAB — CBC
HCT: 31.4 % — ABNORMAL LOW (ref 36.0–46.0)
Hemoglobin: 11 g/dL — ABNORMAL LOW (ref 12.0–15.0)
MCH: 30.1 pg (ref 26.0–34.0)
MCHC: 35 g/dL (ref 30.0–36.0)
MCV: 86 fL (ref 80.0–100.0)
Platelets: 206 K/uL (ref 150–400)
RBC: 3.65 MIL/uL — ABNORMAL LOW (ref 3.87–5.11)
RDW: 15 % (ref 11.5–15.5)
WBC: 13.8 K/uL — ABNORMAL HIGH (ref 4.0–10.5)
nRBC: 0.3 % — ABNORMAL HIGH (ref 0.0–0.2)

## 2024-09-06 NOTE — Patient Instructions (Signed)

## 2024-09-06 NOTE — Lactation Note (Signed)
 This note was copied from a baby's chart. Lactation Consultation Note  Patient Name: Tiffany Wong Date: 09/06/2024 Age:34 hours  Reason for consult: Follow-up assessment;Breastfeeding assistance  Returned to room. Mother had baby latched in cross cradle position. Baby had a shallow latch and mother reports it was uncomfortable. When baby was detached, mother's nipple was blanched at the tip. Assisted mother with re-latching after baby was rooting. Nipple held in teacup hold and infant's lips were flanged wider. Infant has a deeper latch and mother reports more comfort. Mother encouraged to keep baby close, compress breast during feeding to increase colostrum intake and keep baby engaged, and to stimulate baby behind the ear gently to keep her from falling asleep and slipping away from the breast.    Feeding Mother's Current Feeding Choice: Breast Milk  LATCH Score Latch: Grasps breast easily, tongue down, lips flanged, rhythmical sucking.  Audible Swallowing: A few with stimulation  Type of Nipple: Everted at rest and after stimulation  Comfort (Breast/Nipple): Soft / non-tender  Hold (Positioning): Assistance needed to correctly position infant at breast and maintain latch.  LATCH Score: 8   Lactation Tools Discussed/Used  Hand pump at bedside, mother has been instructed on use  Interventions Interventions: Breast feeding basics reviewed;Assisted with latch;Education  Discharge    Consult Status Consult Status: Follow-up Date: 09/07/24 Follow-up type: In-patient    Joshua Line M 09/06/2024, 4:15 PM

## 2024-09-06 NOTE — Progress Notes (Signed)
CSW acknowledged consult and completed a clinical assessment.  There are no barriers to d/c.  Clinical assessment notes will be entered at a later time.   Enos Fling, Theresia Majors Clinical Social Worker [phone]

## 2024-09-06 NOTE — Lactation Note (Signed)
 This note was copied from a baby's chart. Lactation Consultation Note  Patient Name: Tiffany Wong Unijb'd Date: 09/06/2024 Age:34 hours  Reason for consult: Follow-up assessment;Term;Maternal endocrine disorder;Breastfeeding assistance;Mother's request  P4, [redacted]w[redacted]d, 5% weight loss  Mother requested assistance with latch. Baby has not fed since 1100 and mother has attempted. Baby is sleepy.   Stimulation to wake baby. Undressed and placed skin to skin and spoon fed a few drops to entice baby to open mouth and root. Baby is latching shallow and pinching mother's nipple. She suckled a few times and it was painful before falling asleep again. Placed skin to skin and encouraged to call when baby is cuing to feed.       Feeding Mother's Current Feeding Choice: Breast Milk  LATCH Score Latch: Repeated attempts needed to sustain latch, nipple held in mouth throughout feeding, stimulation needed to elicit sucking reflex. (suckled a few times, latch painful)  Audible Swallowing: A few with stimulation  Type of Nipple: Everted at rest and after stimulation  Comfort (Breast/Nipple): Soft / non-tender  Hold (Positioning): Assistance needed to correctly position infant at breast and maintain latch.  LATCH Score: 7   Interventions Interventions: Breast feeding basics reviewed;Assisted with latch;Skin to skin;Hand express;Support pillows;Adjust position   Consult Status Consult Status: Follow-up Date: 09/07/24 Follow-up type: In-patient    Joshua Line M 09/06/2024, 3:50 PM

## 2024-09-06 NOTE — Progress Notes (Signed)
 Post Partum Day 1 Subjective: No complaints, up ad lib, voiding, tolerating PO, and + flatus. Worried about breastfeeding, wants lactation consult. Baby is stable at bedside.  Objective: Blood pressure 104/67, pulse 77, temperature 98.3 F (36.8 C), resp. rate 18, height 5' 2 (1.575 m), weight 83.2 kg, last menstrual period 11/27/2023, SpO2 99%, unknown if currently breastfeeding.  Physical Exam:  General: alert and no distress Lochia: appropriate Uterine Fundus: firm, NT DVT Evaluation: No evidence of DVT seen on physical exam. Negative Homan's sign. No cords or calf tenderness.  Recent Labs    09/04/24 1835 09/06/24 0445  HGB 8.4* 11.0*  HCT 24.9* 31.4*    Assessment/Plan: Breastfeeding, Lactation consult, and Contraception interval BTL Oral iron therapy started for clinically significant postpartum anemia due to expected blood loss after delivery.  Routine postpartum care Likely discharge tomorrow.   LOS: 2 days   Gloris Hugger, MD 09/06/2024, 9:13 AM

## 2024-09-06 NOTE — Anesthesia Postprocedure Evaluation (Signed)
 Anesthesia Post Note  Patient: Tiffany Wong  Procedure(s) Performed: AN AD HOC LABOR EPIDURAL     Patient location during evaluation: Mother Baby Anesthesia Type: Epidural Level of consciousness: awake, oriented and awake and alert Pain management: pain level controlled Vital Signs Assessment: post-procedure vital signs reviewed and stable Respiratory status: spontaneous breathing, respiratory function stable and nonlabored ventilation Cardiovascular status: stable Postop Assessment: no headache, adequate PO intake, able to ambulate, patient able to bend at knees and no apparent nausea or vomiting Anesthetic complications: no   No notable events documented.  Last Vitals:  Vitals:   09/05/24 2310 09/06/24 0500  BP: 129/65 104/67  Pulse: 99 77  Resp: 18 18  Temp: 36.6 C 36.8 C  SpO2: 100% 99%    Last Pain:  Vitals:   09/06/24 0512  TempSrc:   PainSc: 4    Pain Goal:                   Shanan Mcmiller

## 2024-09-06 NOTE — Clinical Social Work Maternal (Signed)
 CLINICAL SOCIAL WORK MATERNAL/CHILD NOTE  Patient Details  Name: Tiffany Wong MRN: [id number] Date of Birth: 03/14/1991  Date:  09/06/2024  Clinical Social Worker Initiating Note:  Rosina Molt Date/Time: Initiated:  09/06/24/1529     Child's Name:  Tiffany Wong   Biological Parents:  Mother, Father Tiffany Wong [birth year]1990 Tiffany Wong 10-12-1989)   Need for Interpreter:  None   Reason for Referral:  Behavioral Health Concerns, Current Substance Use/Substance Use During Pregnancy     Address:  [address] [place] [address]    Phone number:  [phone] (home)     Additional phone number:   Household Members/Support Persons (HM/SP):   Household Member/Support Person 1, Household Member/Support Person 2, Household Member/Support Person 3   HM/SP Name Relationship DOB or Age  HM/SP -1 Tiffany Wong Son 08-14-2011  HM/SP -2 Tiffany Wong Daughter 03-29-2019  HM/SP -3 Tiffany Wong Daughter 10-06-2021  HM/SP -4        HM/SP -5        HM/SP -6        HM/SP -7        HM/SP -8          Natural Supports (not living in the home):  Immediate Family   Professional Supports: None   Employment: Self-employed   Type of Work: Social Worker   Education:  Some Materials Engineer arranged:    Surveyor, Quantity Resources:  [employer]   Other Resources:  Sales Executive  , [employer]   Cultural/Religious Considerations Which May Impact Care:    Strengths:  Ability to meet basic needs  , Home prepared for child  , Pediatrician chosen   Psychotropic Medications:         Pediatrician:    [facility] area  Pediatrician List:   [facility]  [place]    [place]    [place] [facility]      Pediatrician Fax Number:    Risk Factors/Current Problems:  Substance Use  , Mental Health Concerns     Cognitive State:  Alert  , Able to Concentrate  , Linear Thinking  , Insightful  , Goal  Oriented     Mood/Affect:  Calm  , Comfortable  , Interested  , Relaxed     CSW Assessment: CSW received a consult due to drug exposed newborn and per chart review she has mental health hx and CPS involvement. CSW met [name] at bedside to complete a full psychosocial assessment and offer support. CSW entered the room, introduced herself and explained the reason for the visit. [name] presented holding the infant as they lay in bed. [name] was polite, easy to engage, receptive to meeting with CSW, and appeared forthcoming.  CSW collected [name]'s demographic information and she reported CPS hx May 2024 to August 2024 due to [name]'s mental health. [name] reported in the past reaching out to her mom for support due to being overly exhausted and wanted support with her children. [name] reported her mom called CPS and her children were placed with a TSP (her mom). [name] reported admitting herself to [facility] for support and due to having a manic episode. [name] reported completing a case plan and was able to be reunited in August with her children.   CSW inquired about [name]'s mental health history. [name] reported being diagnosed with severe anxiety, and Bipolar at 33 years old. [name] reported her maniac episodes was diagnosed in  her 16's. [name] reported being hospitalized several different times over the years with [facility]. [name] reported her last admission to [facility] was in May 2024 and was she able to establish a solid foundation for her mental health. [name] reported currently participating in therapy once a week with Harlene Sharps at [facility] the [place]. [name] reporting meeting with her psychiatrist Laymon Bach every other month at [facility]. [name] reported currently prescribed Buspar , Zyprexa , Lamotrigine  and she will continue this support management throughout her PP period. [name] reported PPD after each pregnancies and was admitted each time to [facility]. [name] reported in the past wanting to not rely on modern medicine/professional for support and  this time she has created this support in hopes of minimal PP symptoms. CSW provided education regarding the baby blues period vs. perinatal mood disorders, discussed treatment and gave resources for mental health follow up if concerns arise.  CSW recommends self-evaluation during the postpartum time period using the New Mom Checklist from Postpartum Progress and encouraged [name] to contact a medical professional if symptoms are noted at any time. CSW assessed for safety with [name] SI/HI/DV;[name] denied all.  CSW asked [name] does she receive support resources; [name] said yes(WIC and food stamps).  [name] reported having all essential items for the infant including a carseat, bassinet and crib for safe sleeping. CSW provided review of Sudden Infant Death Syndrome (SIDS) precautions.  CSW informed [name] due to [facility] during her pregnancy; the hospital will perform a UDS and CDS on the infant. If the screenings return with positive results a report to CPS will be made; [name] was understanding. [name] reported her last use of THC was in May 2025 and that's when she found out about her pregnancy. [name] reported discontinuing her use of THC during the pregnancy. [name] denied use of any other illicit substances.   CSW will continue to monitor the CDS/UDS and complete a CPS report if warranted.   CSW Plan/Description:    No Further Intervention Required/No Barriers to Discharge, Sudden Infant Death Syndrome (SIDS) Education, Perinatal Mood and Anxiety Disorder (PMADs) Education, Hospital Drug Screen Policy Information, Other Information/Referral to [facility], CSW Will Continue to Monitor Umbilical Cord Tissue Drug Screen Results and Make Report if Ranelle Rosina MARLA Joshua KEN 09/06/2024, 3:33 PM

## 2024-09-06 NOTE — Lactation Note (Signed)
 This note was copied from a baby's chart. Lactation Consultation Note  Patient Name: Tiffany Wong Unijb'd Date: 09/06/2024 Age:34 hours  In to see mother at nurse's request. Mother was in the bathroom and reports she just fed her baby for 20 min but the latch was uncomfortable and pinching. Mother will call out for [facility] to assist with the next feeding. RN gave mother coconut oil.    Consult Status  Mother to call    Joshua Rojelio HERO 09/06/2024, 9:49 AM

## 2024-09-07 MED ORDER — IBUPROFEN 600 MG PO TABS: 600.0000 mg | ORAL_TABLET | Freq: Four times a day (QID) | ORAL | 0 refills | Status: AC

## 2024-09-07 MED ORDER — SENNOSIDES-DOCUSATE SODIUM 8.6-50 MG PO TABS: 2.0000 | ORAL_TABLET | ORAL | 0 refills | Status: DC

## 2024-09-07 MED ORDER — ACETAMINOPHEN 325 MG PO TABS: 650.0000 mg | ORAL_TABLET | ORAL | Status: DC | PRN

## 2024-09-07 NOTE — Lactation Note (Signed)
 This note was copied from a baby's chart. Lactation Consultation Note  Patient Name: Tiffany Wong Unijb'd Date: 09/07/2024 Age:34 hours  Reason for consult: Follow-up assessment;Term  Mother reports baby has been feeding much better and cluster fed for several hours from 2200- 0100. Mother pumped colostrum and fed to baby this am. Mother is feeling confident about breastfeeding and has experience breastfeeding her other 3 children. RN observed baby latching this morning as a 9/10. Baby is currently sleeping after breastfeeding.     Discussed the process of milk production, supply and demand and the importance of breast stimulation and milk removal in order to make an optimal milk supply.  Discussed mother to breastfeed 8-12 times in 24 hours, skin to skin and breast feed before formula feeding.   If missed feedings at breast or substituting feeding with formula, advised to hand express and/or pump to remove milk from the breast.    Lactation Tools Discussed/Used Tools: Pump Breast pump type: Manual Pumped volume: 10 mL  Interventions Interventions: Education  Discharge Discharge Education: Engorgement and breast care;Warning signs for feeding baby;Other (comment) (mother/baby have an OP LC appointment at [facility] 09/27/24) Pump: Manual  Consult Status Consult Status: Complete Date: 09/07/24    Joshua Rojelio HERO 09/07/2024, 11:01 AM

## 2024-09-14 ENCOUNTER — Telehealth (HOSPITAL_COMMUNITY): Payer: MEDICAID | Admitting: Psychiatry

## 2024-09-17 ENCOUNTER — Telehealth (HOSPITAL_COMMUNITY): Payer: Self-pay | Admitting: *Deleted

## 2024-09-17 NOTE — Telephone Encounter (Signed)
 09/17/2024  Name: Tiffany Wong MRN: [id number] DOB: 06-25-1991  Reason for Call:  Transition of Care Hospital Discharge Call  Contact Status: Patient Contact Status: Complete  Language assistant needed:          Follow-Up Questions: Do You Have Any Concerns About Your Health As You Heal From Delivery?: Yes What Concerns Do You Have About Your Health?: Patient asked, Who do I call about getting a prescription for more Ibuprofen ? Patient reported that her pain has not decreased since leaving the hospital. Has not increased either. Denied fever. RN instructed patient to call her OB for medications. RN reviewed warning signs to report - patient denied any at this time. Voiced no other questions or concerns at this time. Do You Have Any Concerns About Your Infants Health?: No  Edinburgh Postnatal Depression Scale:  In the Past 7 Days:  Patient stated, I'm doing good. I had postpartum depression with my first 3 kids, but honestly, I haven't felt down or overwhelmed this time. I think it's because I stayed on my mental health meds through the pregnancy this time, because I wanted to get ahead of the depression. Patient reported that she sees a therapist 1x/week and her psychiatrist monthly for medication management. EPDS declined at this time.  PHQ2-9 Depression Scale:     Discharge Follow-up: Edinburgh score requires follow up?: N/A Patient was advised of the following resources:: Breastfeeding Support Group, Support Group  Post-discharge interventions: Reviewed Newborn Safe Sleep Practices  Signature Allean IVAR Carton, RN, 09/17/24, [phone]

## 2024-09-19 ENCOUNTER — Encounter (HOSPITAL_COMMUNITY): Payer: Self-pay

## 2024-09-19 ENCOUNTER — Telehealth (HOSPITAL_COMMUNITY): Payer: MEDICAID | Admitting: Psychiatry

## 2024-10-02 ENCOUNTER — Encounter (HOSPITAL_COMMUNITY): Payer: Self-pay | Admitting: Psychiatry

## 2024-10-02 ENCOUNTER — Telehealth (INDEPENDENT_AMBULATORY_CARE_PROVIDER_SITE_OTHER): Payer: MEDICAID | Admitting: Psychiatry

## 2024-10-02 DIAGNOSIS — F411 Generalized anxiety disorder: Secondary | ICD-10-CM

## 2024-10-02 DIAGNOSIS — Z720 Tobacco use: Secondary | ICD-10-CM | POA: Diagnosis not present

## 2024-10-02 DIAGNOSIS — F319 Bipolar disorder, unspecified: Secondary | ICD-10-CM

## 2024-10-02 MED ORDER — LAMOTRIGINE 200 MG PO TABS: 200.0000 mg | ORAL_TABLET | Freq: Every day | ORAL | 3 refills | Status: DC

## 2024-10-02 MED ORDER — OLANZAPINE 10 MG PO TABS: ORAL_TABLET | ORAL | 3 refills | Status: AC

## 2024-10-02 MED ORDER — BUSPIRONE HCL 15 MG PO TABS: 15.0000 mg | ORAL_TABLET | Freq: Two times a day (BID) | ORAL | 3 refills | Status: AC

## 2024-10-02 MED ORDER — NICOTINE 7 MG/24HR TD PT24: 7.0000 mg | MEDICATED_PATCH | Freq: Every day | TRANSDERMAL | 3 refills | Status: AC
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
## 2024-10-04 ENCOUNTER — Ambulatory Visit: Payer: MEDICAID | Admitting: Obstetrics

## 2024-10-04 ENCOUNTER — Encounter: Payer: Self-pay | Admitting: Obstetrics

## 2024-10-04 ENCOUNTER — Other Ambulatory Visit: Payer: MEDICAID

## 2024-10-04 DIAGNOSIS — O24439 Gestational diabetes mellitus in the puerperium, unspecified control: Secondary | ICD-10-CM | POA: Diagnosis not present

## 2024-10-04 DIAGNOSIS — Z302 Encounter for sterilization: Secondary | ICD-10-CM

## 2024-10-04 NOTE — Progress Notes (Signed)
 Pt presents for pp. Is it normal for pt to be bleeding still. Pt would like to have her tubes tied or removed. No other questions or concerns at this time.    Post Partum Visit Note  Tiffany Wong is a 33 y.o. [id number] female who presents for a postpartum visit. She is 4 weeks postpartum following a normal spontaneous vaginal delivery.  I have fully reviewed the prenatal and intrapartum course. The delivery was at 39 gestational weeks.  Anesthesia: epidural. Postpartum course has been good. Baby is doing well yes. Baby is feeding by breast. Bleeding red. Bowel function is normal. Bladder function is normal. Patient is not sexually active. Contraception method is tubal ligation. Postpartum depression screening: negative.   The pregnancy intention screening data noted above was reviewed. Potential methods of contraception were discussed. The patient elected to proceed with No data recorded.   Edinburgh Postnatal Depression Scale - 10/04/24 0908       Edinburgh Postnatal Depression Scale:  In the Past 7 Days   I have been able to laugh and see the funny side of things. 0    I have looked forward with enjoyment to things. 0    I have blamed myself unnecessarily when things went wrong. 0    I have been anxious or worried for no good reason. 0    I have felt scared or panicky for no good reason. 0    Things have been getting on top of me. 1    I have been so unhappy that I have had difficulty sleeping. 0    I have felt sad or miserable. 0    I have been so unhappy that I have been crying. 0    The thought of harming myself has occurred to me. 0    Edinburgh Postnatal Depression Scale Total 1          Health Maintenance Due  Topic Date Due   COVID-19 Vaccine (1) Never done   Pneumococcal Vaccine (1 of 2 - PCV) Never done   Hepatitis B Vaccines 19-59 Average Risk (1 of 3 - 19+ 3-dose series) Never done   HPV VACCINES (1 - 3-dose SCDM series) Never done   Influenza Vaccine  05/18/2024     The following portions of the patient's history were reviewed and updated as appropriate: allergies, current medications, past family history, past medical history, past social history, past surgical history, and problem list.  Review of Systems A comprehensive review of systems was negative.  Objective:  BP 110/63   Pulse 81   Wt 165 lb 6.4 oz (75 kg)   LMP 11/27/2023 (Approximate)   Breastfeeding Yes   BMI 30.25 kg/m    General:  alert and no distress   Breasts:  normal  Lungs: clear to auscultation bilaterally  Heart:  regular rate and rhythm, S1, S2 normal, no murmur, click, rub or gallop  Abdomen: soft, non-tender; bowel sounds normal; no masses,  no organomegaly   Wound none  GU exam:  not indicated       Assessment:    1. Postpartum care following vaginal delivery (Primary)  2. Postpartum gestational diabetes mellitus (GDM)  3. Request for sterilization     Plan:   Essential components of care per ACOG recommendations:  1.  Mood and well being: Patient with negative depression screening today. Reviewed local resources for support.  - Patient tobacco use? No.   - hx of drug use? No.    2. Infant  care and feeding:  -Patient currently breastmilk feeding? Yes. Discussed returning to work and pumping. Reviewed importance of draining breast regularly to support lactation.  -Social determinants of health (SDOH) reviewed in EPIC. No concerns.  3. Sexuality, contraception and birth spacing - Patient does not want a pregnancy in the next year.  Desired family size is 5 children.  - Reviewed reproductive life planning. Reviewed contraceptive methods based on pt preferences and effectiveness.  Patient desired Female Sterilization today.   - Discussed birth spacing of 18 months  4. Sleep and fatigue -Encouraged family/partner/community support of 4 hrs of uninterrupted sleep to help with mood and fatigue  5. Physical Recovery  - Discussed patients delivery and  complications. She describes her labor as good. - Patient had a Vaginal, no problems at delivery. Patient had no lacerations.  - Patient has urinary incontinence? No. - Patient is safe to resume physical and sexual activity  6.  Health Maintenance - HM due items addressed Yes - Last pap smear  Diagnosis  Date Value Ref Range Status  02/21/2024 (A)  Final   - Atypical squamous cells of undetermined significance (ASC-US )   Pap smear not done at today's visit.  -Breast Cancer screening indicated? No.   7. Chronic Disease/Pregnancy Condition follow up: None   Carlin Centers, MD, [facility] for [facility], [facility], [place] 10/04/2024

## 2024-10-05 ENCOUNTER — Other Ambulatory Visit: Payer: Self-pay | Admitting: Obstetrics & Gynecology

## 2024-10-05 DIAGNOSIS — Z302 Encounter for sterilization: Secondary | ICD-10-CM

## 2024-10-05 NOTE — Progress Notes (Signed)
 Orders Placed This Encounter  Procedures   Ambulatory Referral For Surgery Scheduling    Referral Priority:   Routine    Referral Type:   Consultation    Number of Visits Requested:   1

## 2024-10-17 ENCOUNTER — Telehealth: Payer: Self-pay

## 2024-10-17 NOTE — Telephone Encounter (Signed)
 I called patient to schedule surgery with Dr. Ozan on 11/14/24 at 10:30 at [facility]. I left a voicemail requesting a call back.

## 2024-10-22 ENCOUNTER — Other Ambulatory Visit: Payer: MEDICAID

## 2024-10-23 ENCOUNTER — Telehealth: Payer: Self-pay

## 2024-10-23 NOTE — Telephone Encounter (Signed)
 Patient called back and stated she's available for surgery on 11/06/24 at 0930 w/ Dr. Eveline. Pre-Op instructions and surgery details were provided by phone.

## 2024-10-23 NOTE — Telephone Encounter (Signed)
 I called patient to see if she's available for surgery w/ Dr. Eveline on 11/06/24 at 0930. I left a voicemail requesting a call back.

## 2024-10-24 DIAGNOSIS — Z302 Encounter for sterilization: Secondary | ICD-10-CM
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
## 2024-10-29 ENCOUNTER — Other Ambulatory Visit: Payer: Self-pay | Admitting: Obstetrics & Gynecology

## 2024-11-01 ENCOUNTER — Encounter (HOSPITAL_COMMUNITY): Payer: Self-pay | Admitting: Obstetrics & Gynecology

## 2024-11-01 NOTE — Progress Notes (Signed)
 Spoke w/ via phone for pre-op interview--- Tiffany Wong needs dos----  UPT and CBC per surgeon.       Wong results------ COVID test -----patient states asymptomatic no test needed Arrive at -------0740 NPO after MN NO Solid Food.   Pre-Surgery Ensure or G2:  Med rec completed Medications to take morning of surgery ----- Buspar , Lamictal , Zyprexa  and Protonix . Diabetic medication -----  GLP1 agonist last dose: GLP1 instructions:  Patient instructed no nail polish to be worn day of surgery Patient instructed to bring photo id and insurance card day of surgery Patient aware to have Driver (ride ) / caregiver    for 24 hours after surgery - Father Tiffany Wong Patient Special Instructions ----- Pre-Op special Instructions -----  Patient verbalized understanding of instructions that were given at this phone interview. Patient denies chest pain, sob, fever, cough at the interview.

## 2024-11-04 ENCOUNTER — Other Ambulatory Visit: Payer: Self-pay | Admitting: Obstetrics & Gynecology

## 2024-11-06 ENCOUNTER — Ambulatory Visit (HOSPITAL_COMMUNITY): Payer: MEDICAID | Admitting: Anesthesiology

## 2024-11-06 ENCOUNTER — Encounter (HOSPITAL_COMMUNITY): Payer: Self-pay | Admitting: Obstetrics & Gynecology

## 2024-11-06 ENCOUNTER — Encounter (HOSPITAL_COMMUNITY)
Admission: RE | Disposition: A | Discharge status: Home / Self Care | Payer: Self-pay | Source: Home / Self Care | Attending: Obstetrics & Gynecology

## 2024-11-06 ENCOUNTER — Ambulatory Visit (HOSPITAL_COMMUNITY)
Admission: RE | Admit: 2024-11-06 | Discharge: 2024-11-06 | Disposition: A | Discharge status: Home / Self Care | Payer: MEDICAID | Attending: Obstetrics & Gynecology | Admitting: Obstetrics & Gynecology

## 2024-11-06 ENCOUNTER — Other Ambulatory Visit: Payer: Self-pay

## 2024-11-06 DIAGNOSIS — Z302 Encounter for sterilization: Secondary | ICD-10-CM

## 2024-11-06 DIAGNOSIS — F17210 Nicotine dependence, cigarettes, uncomplicated: Secondary | ICD-10-CM | POA: Insufficient documentation

## 2024-11-06 HISTORY — PX: LAPAROSCOPIC BILATERAL SALPINGECTOMY: SHX5889

## 2024-11-06 LAB — CBC
HCT: 39.7 % (ref 36.0–46.0)
Hemoglobin: 13.4 g/dL (ref 12.0–15.0)
MCH: 29.4 pg (ref 26.0–34.0)
MCHC: 33.8 g/dL (ref 30.0–36.0)
MCV: 87.1 fL (ref 80.0–100.0)
Platelets: 334 K/uL (ref 150–400)
RBC: 4.56 MIL/uL (ref 3.87–5.11)
RDW: 14.3 % (ref 11.5–15.5)
WBC: 9.1 K/uL (ref 4.0–10.5)
nRBC: 0 % (ref 0.0–0.2)

## 2024-11-06 LAB — POCT PREGNANCY, URINE: Preg Test, Ur: NEGATIVE

## 2024-11-06 MED ORDER — SUGAMMADEX SODIUM 200 MG/2ML IV SOLN
INTRAVENOUS | Status: DC | PRN
  Administered 2024-11-06: 300 mg via INTRAVENOUS

## 2024-11-06 MED ORDER — ONDANSETRON HCL 4 MG/2ML IJ SOLN
INTRAMUSCULAR | Status: DC | PRN
  Administered 2024-11-06: 4 mg via INTRAVENOUS

## 2024-11-06 MED ORDER — OXYCODONE HCL 5 MG/5ML PO SOLN: 5.0000 mg | Freq: Once | ORAL | Status: AC | PRN

## 2024-11-06 MED ORDER — BUPIVACAINE HCL (PF) 0.25 % IJ SOLN
INTRAMUSCULAR | Status: AC
  Filled 2024-11-06: qty 30

## 2024-11-06 MED ORDER — HYDROMORPHONE HCL 1 MG/ML IJ SOLN
0.2500 mg | INTRAMUSCULAR | Status: DC | PRN
  Administered 2024-11-06 (×2): 0.5 mg via INTRAVENOUS

## 2024-11-06 MED ORDER — ACETAMINOPHEN 500 MG PO TABS: 1000.0000 mg | ORAL_TABLET | ORAL | Status: AC

## 2024-11-06 MED ORDER — MIDAZOLAM HCL 2 MG/2ML IJ SOLN
INTRAMUSCULAR | Status: AC
  Filled 2024-11-06: qty 2

## 2024-11-06 MED ORDER — MIDAZOLAM HCL (PF) 2 MG/2ML IJ SOLN
INTRAMUSCULAR | Status: DC | PRN
  Administered 2024-11-06: 2 mg via INTRAVENOUS

## 2024-11-06 MED ORDER — OXYCODONE HCL 5 MG PO TABS
ORAL_TABLET | ORAL | Status: AC
  Filled 2024-11-06: qty 1

## 2024-11-06 MED ORDER — OXYCODONE HCL 5 MG PO TABS
5.0000 mg | ORAL_TABLET | Freq: Once | ORAL | Status: AC | PRN
  Administered 2024-11-06: 5 mg via ORAL

## 2024-11-06 MED ORDER — LACTATED RINGERS IV SOLN: INTRAVENOUS | Status: DC

## 2024-11-06 MED ORDER — DEXAMETHASONE SOD PHOSPHATE PF 10 MG/ML IJ SOLN
INTRAMUSCULAR | Status: AC
  Filled 2024-11-06: qty 1

## 2024-11-06 MED ORDER — ACETAMINOPHEN 500 MG PO TABS
ORAL_TABLET | ORAL | Status: AC
  Administered 2024-11-06: 1000 mg via ORAL
  Filled 2024-11-06: qty 2

## 2024-11-06 MED ORDER — DEXAMETHASONE SOD PHOSPHATE PF 10 MG/ML IJ SOLN
INTRAMUSCULAR | Status: DC | PRN
  Administered 2024-11-06: 10 mg via INTRAVENOUS

## 2024-11-06 MED ORDER — MEPERIDINE HCL 25 MG/ML IJ SOLN: 6.2500 mg | INTRAMUSCULAR | Status: DC | PRN

## 2024-11-06 MED ORDER — POVIDONE-IODINE 10 % EX SWAB: 2.0000 | Freq: Once | CUTANEOUS | Status: DC

## 2024-11-06 MED ORDER — BUPIVACAINE HCL (PF) 0.25 % IJ SOLN
INTRAMUSCULAR | Status: DC | PRN
  Administered 2024-11-06: 11 mL

## 2024-11-06 MED ORDER — LIDOCAINE 2% (20 MG/ML) 5 ML SYRINGE
INTRAMUSCULAR | Status: AC
  Filled 2024-11-06: qty 5

## 2024-11-06 MED ORDER — ROCURONIUM BROMIDE 10 MG/ML (PF) SYRINGE
PREFILLED_SYRINGE | INTRAVENOUS | Status: AC
  Filled 2024-11-06: qty 10

## 2024-11-06 MED ORDER — HYDROMORPHONE HCL 1 MG/ML IJ SOLN
INTRAMUSCULAR | Status: AC
  Filled 2024-11-06: qty 1

## 2024-11-06 MED ORDER — CHLORHEXIDINE GLUCONATE 0.12 % MT SOLN: 15.0000 mL | Freq: Once | OROMUCOSAL | Status: AC

## 2024-11-06 MED ORDER — PROPOFOL 10 MG/ML IV BOLUS
INTRAVENOUS | Status: DC | PRN
  Administered 2024-11-06: 130 mg via INTRAVENOUS

## 2024-11-06 MED ORDER — OXYCODONE HCL 5 MG PO TABS: 5.0000 mg | ORAL_TABLET | ORAL | 0 refills | Status: AC | PRN

## 2024-11-06 MED ORDER — FENTANYL CITRATE (PF) 100 MCG/2ML IJ SOLN
INTRAMUSCULAR | Status: AC
  Filled 2024-11-06: qty 2

## 2024-11-06 MED ORDER — LIDOCAINE 2% (20 MG/ML) 5 ML SYRINGE
INTRAMUSCULAR | Status: DC | PRN
  Administered 2024-11-06: 60 mg via INTRAVENOUS

## 2024-11-06 MED ORDER — ORAL CARE MOUTH RINSE: 15.0000 mL | Freq: Once | OROMUCOSAL | Status: AC

## 2024-11-06 MED ORDER — FENTANYL CITRATE (PF) 100 MCG/2ML IJ SOLN
INTRAMUSCULAR | Status: DC | PRN
  Administered 2024-11-06 (×2): 50 ug via INTRAVENOUS

## 2024-11-06 MED ORDER — AMISULPRIDE (ANTIEMETIC) 5 MG/2ML IV SOLN: 10.0000 mg | Freq: Once | INTRAVENOUS | Status: DC | PRN

## 2024-11-06 MED ORDER — PROPOFOL 10 MG/ML IV BOLUS
INTRAVENOUS | Status: AC
  Filled 2024-11-06: qty 20

## 2024-11-06 MED ORDER — ROCURONIUM BROMIDE 10 MG/ML (PF) SYRINGE
PREFILLED_SYRINGE | INTRAVENOUS | Status: DC | PRN
  Administered 2024-11-06: 70 mg via INTRAVENOUS

## 2024-11-06 MED ORDER — ONDANSETRON HCL 4 MG/2ML IJ SOLN
INTRAMUSCULAR | Status: AC
  Filled 2024-11-06: qty 2

## 2024-11-06 MED ORDER — CHLORHEXIDINE GLUCONATE 0.12 % MT SOLN
OROMUCOSAL | Status: AC
  Administered 2024-11-06: 15 mL via OROMUCOSAL
  Filled 2024-11-06: qty 15

## 2024-11-06 NOTE — Anesthesia Procedure Notes (Signed)
 Procedure Name: Intubation Date/Time: 11/06/2024 9:46 AM  Performed by: Dianne Burnard RAMAN, CRNAPre-anesthesia Checklist: Patient identified, Emergency Drugs available, Suction available and Patient being monitored Patient Re-evaluated:Patient Re-evaluated prior to induction Oxygen Delivery Method: Circle system utilized Preoxygenation: Pre-oxygenation with 100% oxygen Induction Type: IV induction Ventilation: Mask ventilation without difficulty Laryngoscope Size: Miller and 3 Grade View: Grade I Tube type: Oral Tube size: 7.0 mm Number of attempts: 1 Airway Equipment and Method: Stylet and Oral airway Placement Confirmation: ETT inserted through vocal cords under direct vision, positive ETCO2 and breath sounds checked- equal and bilateral Secured at: 21 cm Tube secured with: Tape Dental Injury: Teeth and Oropharynx as per pre-operative assessment

## 2024-11-06 NOTE — Anesthesia Postprocedure Evaluation (Signed)
"   Anesthesia Post Note  Patient: Tiffany Wong  Procedure(s) Performed: SALPINGECTOMY, BILATERAL, LAPAROSCOPIC (Abdomen)     Patient location during evaluation: PACU Anesthesia Type: General Level of consciousness: awake and alert Pain management: pain level controlled Vital Signs Assessment: post-procedure vital signs reviewed and stable Respiratory status: spontaneous breathing, nonlabored ventilation and respiratory function stable Cardiovascular status: blood pressure returned to baseline and stable Postop Assessment: no apparent nausea or vomiting Anesthetic complications: no   No notable events documented.  Last Vitals:  Vitals:   11/06/24 1100 11/06/24 1115  BP: (!) 104/56 108/69  Pulse: 60 (!) 59  Resp: 16 17  Temp:  36.4 C  SpO2: 98% 94%    Last Pain:  Vitals:   11/06/24 1116  TempSrc:   PainSc: 4                  Butler Levander Pinal      "

## 2024-11-06 NOTE — Op Note (Signed)
 Shinita Jungman PROCEDURE DATE: 11/06/2024   PREOPERATIVE DIAGNOSIS:  Undesired fertility  POSTOPERATIVE DIAGNOSIS:  Undesired fertility  PROCEDURE:  Laparoscopic Bilateral Salpingectomy   SURGEON:  Eveline Lynwood MATSU, MD  ASSISTANT:  Dr. Glenys Birk. An experienced assistant was required given the standard of surgical care given the complexity of the case.  This assistant was needed for exposure, dissection, suctioning, retraction, instrument exchange, and for overall help during the procedure.  ANESTHESIA:  General endotracheal  COMPLICATIONS:  None immediate.  ESTIMATED BLOOD LOSS:  minimal less than 5 ml.  INDICATIONS: 34 y.o. [id number] with undesired fertility, desires permanent sterilization.  Other forms of contraception were discussed with patient and emphasized alternatives of vasectomy, IUDs and Nexplanon as they have equivalent contraceptive efficacy; she declines all other modalities.    Risks of procedure discussed with patient including permanence of method, risk of regret, bleeding, infection, injury to surrounding organs and need for additional procedures including laparotomy.  Failure risk less than 0.5% with increased risk of ectopic gestation if pregnancy occurs was also discussed with patient.    Also discussed possibility of post-tubal syndrome with increased pelvic pain or menstrual irregularities.  Written informed consent was obtained.      FINDINGS:  Normal uterus, fallopian tubes, and ovaries. Normal upper abdomen. No anomalies noted.  TECHNIQUE:  The patient was taken to the operating room where general anesthesia was obtained without difficulty.  She was then placed in the dorsal lithotomy position and prepared and draped in sterile fashion.  After an adequate timeout was performed, a bivalved speculum was then placed in the patient's vagina, and as the cervix was very posterior and uterine manipulator was not necessary, the speculum was removed from the  vagina.  Attention was then turned to the patient's abdomen where a 10-mm skin incision was made in the umbilical fold. After Veress needle was placed the abdomen was insufflated. The Optiview 11mm trocar and sleeve were then advanced without difficulty with the laparoscope under direct visualization into the abdomen. A survey of the patient's pelvis and abdomen revealed the findings above. A 5-mm left and a 5-mm right lower quadrant ports were then placed under direct visualization.  The fallopian tubes were transected from the uterine attachments and the underlying mesosalpinx with the Harmonic scalpel device allowing for bilateral salpingectomy.  The fallopian tubes were then removed from the abdomen under direct visualization.  The operative site was surveyed, and it was found to be hemostatic.   No intraoperative injury to other surrounding organs was noted.  The abdomen was desufflated and all instruments were then removed from the patient's abdomen.  The fascia at the umbilicus was closed with single 0 Vicryl suture. All skin incisions were closed with 4-0 Vicryl and Dermabond. The patient tolerated the procedure well.  Sponge, lap, and needle counts were correct times two.  The patient was then taken to the recovery room awake, extubated and in stable condition.   The patient will be discharged to home as per PACU criteria.  Routine postoperative instructions given.  She was prescribed Oxycodone , Ibuprofen .  She will follow up in the office in 2-3 weeks for postoperative evaluation.   Eveline Lynwood MATSU, MD Obstetrician & Gynecologist, [facility] for [facility], [facility]

## 2024-11-06 NOTE — Interval H&P Note (Signed)
 History and Physical Interval Note:  11/06/2024 8:56 AM  Tiffany Wong  has presented today for surgery, with the diagnosis of Undesired fertility.  The various methods of treatment have been discussed with the patient and family. After consideration of risks, benefits and other options for treatment, the patient has consented to  Procedures: SALPINGECTOMY, BILATERAL, LAPAROSCOPIC (N/A) as a surgical intervention.  The patient's history has been reviewed, patient examined, no change in status, stable for surgery.  I have reviewed the patient's chart and labs.  Questions were answered to the patient's satisfaction.   Patient desires permanent sterilization.  Other reversible forms of contraception were discussed with patient; she declines all other modalities. Risks of procedure discussed with patient including but not limited to: risk of regret, permanence of method, bleeding, infection, injury to surrounding organs and need for additional procedures.  Failure risk of about 1% with increased risk of ectopic gestation if pregnancy occurs was also discussed with patient.  Also discussed possibility of post-tubal pain syndrome. Patient verbalized understanding of these risks and wants to proceed with sterilization.  Written informed consent obtained.  To OR when ready.  Lynwood Solomons

## 2024-11-06 NOTE — Transfer of Care (Signed)
 Immediate Anesthesia Transfer of Care Note  Patient: Tiffany Wong  Procedure(s) Performed: SALPINGECTOMY, BILATERAL, LAPAROSCOPIC (Abdomen)  Patient Location: PACU  Anesthesia Type:General  Level of Consciousness: awake, alert , and oriented  Airway & Oxygen Therapy: Patient Spontanous Breathing  Post-op Assessment: Report given to RN and Post -op Vital signs reviewed and stable  Post vital signs: Reviewed and stable  Last Vitals:  Vitals Value Taken Time  BP 98/37 11/06/24 10:36  Temp    Pulse 72 11/06/24 10:39  Resp 17 11/06/24 10:39  SpO2 99 % 11/06/24 10:39  Vitals shown include unfiled device data.  Last Pain:  Vitals:   11/06/24 0840  TempSrc:   PainSc: 0-No pain      Patients Stated Pain Goal: 3 (11/06/24 0800)  Complications: No notable events documented.

## 2024-11-06 NOTE — Anesthesia Preprocedure Evaluation (Signed)
"                                    Anesthesia Evaluation  Patient identified by MRN, date of birth, ID band Patient awake    Reviewed: Allergy & Precautions, H&P , NPO status , Patient's Chart, lab work & pertinent test results  Airway Mallampati: II  TM Distance: >3 FB Neck ROM: Full    Dental  (+) Dental Advisory Given   Pulmonary neg pulmonary ROS, Current Smoker and Patient abstained from smoking.   Pulmonary exam normal breath sounds clear to auscultation       Cardiovascular negative cardio ROS Normal cardiovascular exam Rhythm:Regular Rate:Normal     Neuro/Psych   Anxiety Depression Bipolar Disorder   negative neurological ROS  negative psych ROS   GI/Hepatic negative GI ROS, Neg liver ROS,,,  Endo/Other  negative endocrine ROS    Renal/GU negative Renal ROS  negative genitourinary   Musculoskeletal negative musculoskeletal ROS (+)    Abdominal   Peds negative pediatric ROS (+)  Hematology negative hematology ROS (+)   Anesthesia Other Findings   Reproductive/Obstetrics negative OB ROS                              Anesthesia Physical Anesthesia Plan  ASA: 2  Anesthesia Plan: General   Post-op Pain Management: Tylenol  PO (pre-op)*   Induction: Intravenous  PONV Risk Score and Plan: 2 and Ondansetron , Midazolam  and Treatment may vary due to age or medical condition  Airway Management Planned: Oral ETT  Additional Equipment:   Intra-op Plan:   Post-operative Plan: Extubation in OR  Informed Consent: I have reviewed the patients History and Physical, chart, labs and discussed the procedure including the risks, benefits and alternatives for the proposed anesthesia with the patient or authorized representative who has indicated his/her understanding and acceptance.     Dental advisory given  Plan Discussed with: CRNA  Anesthesia Plan Comments:         Anesthesia Quick Evaluation  "

## 2024-11-07 ENCOUNTER — Encounter (HOSPITAL_COMMUNITY): Payer: Self-pay | Admitting: Obstetrics & Gynecology

## 2024-11-07 ENCOUNTER — Ambulatory Visit: Payer: Self-pay | Admitting: Obstetrics & Gynecology

## 2024-11-07 LAB — SURGICAL PATHOLOGY

## 2024-11-08 ENCOUNTER — Other Ambulatory Visit: Payer: MEDICAID

## 2024-11-14 ENCOUNTER — Other Ambulatory Visit: Payer: MEDICAID

## 2024-12-12 ENCOUNTER — Telehealth (HOSPITAL_COMMUNITY): Payer: MEDICAID | Admitting: Psychiatry
